# Patient Record
Sex: Female | Born: 1955 | Race: White | Hispanic: No | Marital: Married | Smoking: Never smoker
Health system: Southern US, Community
[De-identification: ages and names within clinical notes are randomized; demographics above are authoritative.]

## PROBLEM LIST (undated history)

## (undated) DIAGNOSIS — R011 Cardiac murmur, unspecified: Secondary | ICD-10-CM

## (undated) DIAGNOSIS — F419 Anxiety disorder, unspecified: Secondary | ICD-10-CM

## (undated) DIAGNOSIS — K219 Gastro-esophageal reflux disease without esophagitis: Secondary | ICD-10-CM

## (undated) DIAGNOSIS — E079 Disorder of thyroid, unspecified: Secondary | ICD-10-CM

## (undated) DIAGNOSIS — I10 Essential (primary) hypertension: Secondary | ICD-10-CM

## (undated) DIAGNOSIS — M1990 Unspecified osteoarthritis, unspecified site: Secondary | ICD-10-CM

## (undated) DIAGNOSIS — E785 Hyperlipidemia, unspecified: Secondary | ICD-10-CM

## (undated) DIAGNOSIS — E119 Type 2 diabetes mellitus without complications: Secondary | ICD-10-CM

## (undated) HISTORY — DX: Hyperlipidemia, unspecified: E78.5

## (undated) HISTORY — DX: Gastro-esophageal reflux disease without esophagitis: K21.9

## (undated) HISTORY — PX: REPLACEMENT TOTAL KNEE: SUR1224

## (undated) HISTORY — PX: APPENDECTOMY: SHX54

## (undated) HISTORY — DX: Cardiac murmur, unspecified: R01.1

## (undated) HISTORY — DX: Anxiety disorder, unspecified: F41.9

---

## 1998-09-09 ENCOUNTER — Other Ambulatory Visit: Admission: RE | Admit: 1998-09-09 | Discharge: 1998-09-09 | Payer: Self-pay

## 2007-01-22 HISTORY — PX: COLONOSCOPY: SHX5424

## 2007-03-31 ENCOUNTER — Inpatient Hospital Stay (HOSPITAL_COMMUNITY): Admission: RE | Admit: 2007-03-31 | Discharge: 2007-04-02 | Payer: Self-pay | Admitting: Orthopedic Surgery

## 2017-10-17 ENCOUNTER — Emergency Department (HOSPITAL_COMMUNITY): Payer: BLUE CROSS/BLUE SHIELD

## 2017-10-17 ENCOUNTER — Emergency Department (HOSPITAL_COMMUNITY)
Admission: EM | Admit: 2017-10-17 | Discharge: 2017-10-17 | Disposition: A | Discharge status: Home / Self Care | Payer: BLUE CROSS/BLUE SHIELD | Attending: Emergency Medicine | Admitting: Emergency Medicine

## 2017-10-17 ENCOUNTER — Encounter (HOSPITAL_COMMUNITY): Payer: Self-pay

## 2017-10-17 DIAGNOSIS — R1013 Epigastric pain: Secondary | ICD-10-CM | POA: Diagnosis present

## 2017-10-17 DIAGNOSIS — Z96659 Presence of unspecified artificial knee joint: Secondary | ICD-10-CM | POA: Diagnosis not present

## 2017-10-17 DIAGNOSIS — Z7984 Long term (current) use of oral hypoglycemic drugs: Secondary | ICD-10-CM | POA: Diagnosis not present

## 2017-10-17 DIAGNOSIS — I10 Essential (primary) hypertension: Secondary | ICD-10-CM | POA: Diagnosis not present

## 2017-10-17 DIAGNOSIS — E119 Type 2 diabetes mellitus without complications: Secondary | ICD-10-CM | POA: Insufficient documentation

## 2017-10-17 DIAGNOSIS — D649 Anemia, unspecified: Secondary | ICD-10-CM

## 2017-10-17 DIAGNOSIS — Z7982 Long term (current) use of aspirin: Secondary | ICD-10-CM | POA: Insufficient documentation

## 2017-10-17 DIAGNOSIS — K922 Gastrointestinal hemorrhage, unspecified: Secondary | ICD-10-CM | POA: Insufficient documentation

## 2017-10-17 DIAGNOSIS — E079 Disorder of thyroid, unspecified: Secondary | ICD-10-CM | POA: Diagnosis not present

## 2017-10-17 DIAGNOSIS — Z79899 Other long term (current) drug therapy: Secondary | ICD-10-CM | POA: Diagnosis not present

## 2017-10-17 DIAGNOSIS — K921 Melena: Secondary | ICD-10-CM | POA: Diagnosis not present

## 2017-10-17 DIAGNOSIS — R110 Nausea: Secondary | ICD-10-CM | POA: Diagnosis not present

## 2017-10-17 HISTORY — DX: Unspecified osteoarthritis, unspecified site: M19.90

## 2017-10-17 HISTORY — DX: Disorder of thyroid, unspecified: E07.9

## 2017-10-17 HISTORY — DX: Essential (primary) hypertension: I10

## 2017-10-17 HISTORY — DX: Type 2 diabetes mellitus without complications: E11.9

## 2017-10-17 LAB — COMPREHENSIVE METABOLIC PANEL
ALT: 15 U/L (ref 14–54)
AST: 17 U/L (ref 15–41)
Albumin: 3.5 g/dL (ref 3.5–5.0)
Alkaline Phosphatase: 77 U/L (ref 38–126)
Anion gap: 9 (ref 5–15)
BUN: 15 mg/dL (ref 6–20)
CO2: 23 mmol/L (ref 22–32)
Calcium: 8.4 mg/dL — ABNORMAL LOW (ref 8.9–10.3)
Chloride: 107 mmol/L (ref 101–111)
Creatinine, Ser: 0.67 mg/dL (ref 0.44–1.00)
GFR calc Af Amer: >60 mL/min (ref >60)
GFR calc non Af Amer: >60 mL/min (ref >60)
Glucose, Bld: 157 mg/dL — ABNORMAL HIGH (ref 65–99)
Potassium: 3.4 mmol/L — ABNORMAL LOW (ref 3.5–5.1)
Sodium: 139 mmol/L (ref 135–145)
Total Bilirubin: 0.5 mg/dL (ref 0.3–1.2)
Total Protein: 6.4 g/dL — ABNORMAL LOW (ref 6.5–8.1)

## 2017-10-17 LAB — TYPE AND SCREEN
ABO/RH(D): O NEG
Antibody Screen: NEGATIVE

## 2017-10-17 LAB — ABO/RH: ABO/RH(D): O NEG

## 2017-10-17 LAB — CBC
HCT: 23.9 % — ABNORMAL LOW (ref 36.0–46.0)
Hemoglobin: 8 g/dL — ABNORMAL LOW (ref 12.0–15.0)
MCH: 30.8 pg (ref 26.0–34.0)
MCHC: 33.5 g/dL (ref 30.0–36.0)
MCV: 91.9 fL (ref 78.0–100.0)
Platelets: 233 10*3/uL (ref 150–400)
RBC: 2.6 MIL/uL — ABNORMAL LOW (ref 3.87–5.11)
RDW: 15.5 % (ref 11.5–15.5)
WBC: 12.2 10*3/uL — ABNORMAL HIGH (ref 4.0–10.5)

## 2017-10-17 LAB — POC OCCULT BLOOD, ED: Fecal Occult Bld: POSITIVE — AB

## 2017-10-17 MED ORDER — MORPHINE SULFATE (PF) 4 MG/ML IV SOLN
4.0000 mg | Freq: Once | INTRAVENOUS | Status: AC
  Administered 2017-10-17: 4 mg via INTRAVENOUS
  Filled 2017-10-17: qty 1

## 2017-10-17 MED ORDER — ONDANSETRON HCL 4 MG/2ML IJ SOLN
4.0000 mg | Freq: Once | INTRAMUSCULAR | Status: AC
  Administered 2017-10-17: 4 mg via INTRAVENOUS
  Filled 2017-10-17: qty 2

## 2017-10-17 MED ORDER — IOPAMIDOL (ISOVUE-300) INJECTION 61%
INTRAVENOUS | Status: AC
  Administered 2017-10-17: 100 mL via INTRAVENOUS
  Filled 2017-10-17: qty 100

## 2017-10-17 MED ORDER — SODIUM CHLORIDE 0.9 % IV BOLUS (SEPSIS)
1000.0000 mL | Freq: Once | INTRAVENOUS | Status: AC
  Administered 2017-10-17: 1000 mL via INTRAVENOUS

## 2017-10-17 NOTE — ED Triage Notes (Signed)
Patient c/o upper and mid abdominal pain, nausea, black stools x 5 days. Patient states she saw her PCP yesterday and called the patient this AM and told the patient to come to the ED.

## 2017-10-17 NOTE — Discharge Instructions (Signed)
Blood work showed that you have a low red blood count. Your hemoglobin is 8.0 (normal is about 12). Your test for blood in the stool was positive. The CT scan of your abdomen did not show [place] a reason for why you are having lower abdominal bleeding. Please keep your appointment with the GI doctor tomorrow for follow-up and potential need for colonoscopy/endoscopy to determine where the bleeding is coming from. Please also schedule an appointment with your primary care doctor regarding today's ER visit.  Please take over-the-counter elemental iron in the meantime. You can take 325 mg twice a day for this. It may make you constipated, it is okay to take over-the-counter laxatives like MiraLAX while you're taking iron.  Your CT scan also showed evidence of hepatic steatosis. Your blood pressure was also elevated in the ER today. Please follow up with your primary doctor regarding this.   Please return to the emergency room if you develop increased blood in your stool, develop a fever with abdominal pain, have nausea/vomiting that does not improve, or have any new or worsening symptoms.

## 2017-10-17 NOTE — ED Provider Notes (Signed)
[facility] DEPT Provider Note   CSN: [id number] Arrival date & time: 10/17/17  1207     History   Chief Complaint Chief Complaint  Patient presents with  . Abdominal Pain  . GI Bleeding    HPI Mercedes Adams is a 61 y.o. female.  HPI   Mercedes Adams is a 61 year old female with a history of non-insulin-dependent diabetes, hypertension, osteoarthritis, hypothyroidism who presents to the emergency department for evaluation of left lower and epigastric abdominal pain with black tarry stools. Patient states that 5 days ago she developed gradually worsening "cramping" epigastric and left lower quadrant pain. At this time her pain is 4/10 in severity, she notes that it was 10/10 in severity a few days ago. She reports that her pain is occasionally improved with eating. She also has associated nausea, reports 1 episode of brown/red emesis which occurred 4 days ago. She has not had vomiting since this time. Patient also endorses approximately 3 episodes of black tarry stools per day for the past 4 days. She reports that she has never had this before. States that her last colonoscopy was in 2005, reports multiple polyps were removed at that time. She has a history of 2 cesarean deliveries and appendectomy surgery. She denies alcohol use, reports occasional NSAID use for her osteoarthritis. States that it has been several weeks since she last took ibuprofen. She denies fever, hematochezia, hematuria, dysuria, frequency, shortness of breath, chest pain. He states that she has felt fatigued for the past several days. No unexpected weight changes recently. Denies family history of colorectal cancer.   Past Medical History:  Diagnosis Date  . Arthritis   . Diabetes mellitus without complication ([place])   . Hypertension   . Thyroid disease     There are no active problems to display for this patient.   Past Surgical History:  Procedure Laterality Date  . APPENDECTOMY      . CESAREAN SECTION     x 2  . REPLACEMENT TOTAL KNEE      OB History    No data available       Home Medications    Prior to Admission medications   Medication Sig Start Date End Date Taking? Authorizing Provider  aspirin (BAYER ASPIRIN EC LOW DOSE) 81 MG EC tablet Take 81 mg by mouth daily. Swallow whole.   Yes [provider]  folic acid (FOLVITE) 1 MG tablet Take 1 mg by mouth daily. 09/06/17  Yes [provider]  ibuprofen (ADVIL,MOTRIN) 200 MG tablet Take 200 mg by mouth every 6 (six) hours as needed.   Yes [provider]  levothyroxine (SYNTHROID, LEVOTHROID) 100 MCG tablet Take 100 mcg by mouth daily before breakfast.   Yes [provider]  lisinopril-hydrochlorothiazide (PRINZIDE,ZESTORETIC) 10-12.5 MG tablet Take 1 tablet by mouth daily.   Yes [provider]  lovastatin (MEVACOR) 20 MG tablet Take 20 mg by mouth at bedtime. 2 tablets at bedtime   Yes [provider]  metFORMIN (GLUCOPHAGE) 500 MG tablet Take 2,000 mg by mouth daily with breakfast.   Yes [provider]  Methotrexate Sodium (METHOTREXATE, PF,) 50 MG/2ML injection Inject into the vein once a week.   Yes [provider]  Multiple Vitamin (MULTIVITAMIN) capsule Take 1 capsule by mouth daily.   Yes [provider]  omeprazole (PRILOSEC) 40 MG capsule Take 40 mg by mouth daily. 10/16/17  Yes [provider]  sertraline (ZOLOFT) 50 MG tablet Take 50 mg  by mouth daily.   Yes [provider]    Family History Family History  Problem Relation Age of Onset  . Hypertension Mother   . High Cholesterol Mother   . Hypertension Father   . High Cholesterol Father   . Heart failure Father     Social History Social History  Substance Use Topics  . Smoking status: Never Smoker  . Smokeless tobacco: Never Used  . Alcohol use No     Allergies   Patient has no known allergies.   Review of Systems Review of  Systems  Constitutional: Positive for fatigue. Negative for chills, fever and unexpected weight change.  HENT: Negative for nosebleeds.   Eyes: Negative for visual disturbance.  Respiratory: Negative for cough and shortness of breath.   Cardiovascular: Negative for chest pain, palpitations and leg swelling.  Gastrointestinal: Positive for abdominal pain, blood in stool, nausea and vomiting. Negative for constipation and diarrhea.  Genitourinary: Negative for difficulty urinating, dysuria, flank pain, frequency, hematuria, vaginal bleeding, vaginal discharge and vaginal pain.  Musculoskeletal: Negative for back pain and gait problem.  Skin: Negative for rash and wound.  Neurological: Negative for dizziness, weakness, light-headedness, numbness and headaches.  Psychiatric/Behavioral: Negative for agitation.     Physical Exam Updated Vital Signs BP 122/80   Pulse 74   Temp 98.3 F (36.8 C) (Oral)   Resp 16   Ht 5\' 4"  (1.626 m)   Wt 93 kg (205 lb)   SpO2 99%   BMI 35.19 kg/m   Physical Exam  Constitutional: She is oriented to person, place, and time. She appears well-developed and well-nourished. No distress.  Calm, answering questions appropriately.   HENT:  Head: Normocephalic and atraumatic.  Mouth/Throat: Oropharynx is clear and moist. No oropharyngeal exudate.  Mucous membranes moist.   Eyes: Pupils are equal, round, and reactive to light. Conjunctivae are normal. Right eye exhibits no discharge. Left eye exhibits no discharge.  Neck: Normal range of motion. Neck supple.  Cardiovascular: Normal rate, regular rhythm and intact distal pulses.  Exam reveals no friction rub.   No murmur heard. Pulmonary/Chest: Effort normal and breath sounds normal. No respiratory distress. She has no wheezes. She has no rales.  Abdominal: Soft. Bowel sounds are normal. She exhibits no distension.  Mildly tender to palpation in the LLQ and epigastrium with deep palpation. No guarding, rigidity  or rebound. Murphys sign negative.   Musculoskeletal: Normal range of motion.  Lymphadenopathy:    She has no cervical adenopathy.  Neurological: She is alert and oriented to person, place, and time. Coordination normal.  Skin: Skin is warm and dry. Capillary refill takes less than 2 seconds. She is not diaphoretic.  Psychiatric: She has a normal mood and affect. Her behavior is normal.  Nursing note and vitals reviewed.    ED Treatments / Results  Labs (all labs ordered are listed, but only abnormal results are displayed) Labs Reviewed  COMPREHENSIVE METABOLIC PANEL - Abnormal; Notable for the following:       Result Value   Potassium 3.4 (*)    Glucose, Bld 157 (*)    Calcium 8.4 (*)    Total Protein 6.4 (*)    All other components within normal limits  CBC - Abnormal; Notable for the following:    WBC 12.2 (*)    RBC 2.60 (*)    Hemoglobin 8.0 (*)    HCT 23.9 (*)    All other components within normal limits  POC OCCULT BLOOD, ED -  Abnormal; Notable for the following:    Fecal Occult Bld POSITIVE (*)    All other components within normal limits  TYPE AND SCREEN  ABO/RH    EKG  EKG Interpretation None       Radiology Ct Abdomen Pelvis W Contrast  Result Date: 10/17/2017 CLINICAL DATA:  Upper and mid abdominal pain with black stools x5 days. EXAM: CT ABDOMEN AND PELVIS WITH CONTRAST TECHNIQUE: Multidetector CT imaging of the abdomen and pelvis was performed using the standard protocol following bolus administration of intravenous contrast. CONTRAST:  100 cc ISOVUE-300 IOPAMIDOL (ISOVUE-300) INJECTION 61% COMPARISON:  11/12/2007 CT FINDINGS: Lower chest: Normal size heart. No pericardial effusion. Dependent atelectasis the lung bases. Hepatobiliary: Hepatic steatosis. No biliary dilatation. No gallstones or secondary signs acute cholecystitis. No choledocholithiasis. Pancreas: Normal Spleen: Normal Adrenals/Urinary Tract: Normal bilateral adrenal glands and kidneys. No  hydroureteronephrosis. Physiologic distention of the bladder without focal mural thickening or calculus. Stomach/Bowel: Nondistended stomach without wall thickening. Normal small bowel rotation and ligament of Treitz position. No small bowel inflammation or obstruction. Status post appendectomy by report. An average amount of stool is seen retained within the cecum and ascending colon. No mural thickening or inflammation is identified. A few scattered colonic diverticula are noted along the distal descending colon. Vascular/Lymphatic: Mild aortoiliac atherosclerosis. 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 No aneurysm or adenopathy. Reproductive: Uterus and bilateral adnexa are unremarkable. Other: Tiny fat containing umbilical hernia. No abdominopelvic ascites. Musculoskeletal: T8 through T12 degenerative disc disease as well as at L5-S1. No acute nor suspicious osseous abnormalities. IMPRESSION: 1. Hepatic steatosis. 2. Scattered minimal colonic diverticulosis without acute diverticulitis. No mural thickening, inflammation or bowel obstruction. 3. Mild thoracic and lower lumbar spondylosis. Electronically Signed   By: Ashley Royalty M.D.   On: 10/17/2017 18:21    Procedures Procedures (including critical care time)  Medications Ordered in ED Medications  sodium chloride 0.9 % bolus 1,000 mL (0 mLs Intravenous Stopped 10/17/17 2109)  morphine 4 MG/ML injection 4 mg (4 mg Intravenous Given 10/17/17 1813)  ondansetron (ZOFRAN) injection 4 mg (4 mg Intravenous Given 10/17/17 1813)  iopamidol (ISOVUE-300) 61 % injection (100 mLs Intravenous Contrast Given 10/17/17 1800)     Initial Impression / Assessment and Plan / ED Course  I have reviewed the triage vital signs and the nursing notes.  Pertinent labs & imaging results that were available during my care of the patient were reviewed by me and considered in my medical decision making (see chart for details).  Clinical Course as of  Oct 18 56  Thu Oct 17, 2017  1739 Concern for lower GI bleed and potential colitis given BUN normal, leukocytosis (WBC 12.2), anemia (Hgb 8.0). We do not have a baseline hemoglobin on record. Discussed patient with Dr. Eulis Foster who agrees with plan for CT abdomen/pelvis.   [ES]    Clinical Course User Index [ES] Glyn Ade, PA-C    Patient presents with abdominal pain and several days of dark tarry stools. She is anemic with Hgb 8.0, no baseline to compare this to. She has a mildly elevated WBC (12.2) and positive Hemoccult. Will not transfuse with hemoglobin of 8. CT scan ordered to evaluate for lower GI bleed and potential colitis.   CT scan reveals diverticulosis, no diverticulitis. No mucosal thickening to suggest colitis. No bowel obstruction. On reevaluation patient states that her abdominal pain has improved, she denies nausea. Is able to tolerate PO fluids at bedside. She has an  appointment with GI tomorrow morning. Have counseled patient to keep appointment with GI for further evaluation of lower GI bleeding and also to follow up with her primary doctor regarding today's ER visit. In the meantime, have counseled patient to take elemental iron. Have given patient strict return precautions including worsening blood per rectum, nausea/vomiting that does not improve, development of a fever with abdominal pain. Also discussed that patient has hepatic steatosis on CT abdomen, she has been counseled to follow up on this with her primary doctor. Patient agrees and voices understanding. Discussed this patient with Dr. Eulis Foster who agrees with plan and discharge.   Final Clinical Impressions(s) / ED Diagnoses   Final diagnoses:  Black stools  Anemia, unspecified type  Epigastric pain  Nausea    New Prescriptions Discharge Medication List as of 10/17/2017  8:47 PM       Glyn Ade, PA-C 10/18/17 0737    Daleen Bo, MD 10/22/17 1030

## 2017-10-18 ENCOUNTER — Ambulatory Visit (INDEPENDENT_AMBULATORY_CARE_PROVIDER_SITE_OTHER): Payer: Self-pay | Admitting: Physician Assistant

## 2017-10-18 ENCOUNTER — Encounter: Payer: Self-pay | Admitting: Gastroenterology

## 2017-10-18 ENCOUNTER — Encounter: Payer: Self-pay | Admitting: Physician Assistant

## 2017-10-18 VITALS — BP 122/60 | HR 68 | Ht 64.0 in | Wt 211.2 lb

## 2017-10-18 DIAGNOSIS — R011 Cardiac murmur, unspecified: Secondary | ICD-10-CM

## 2017-10-18 DIAGNOSIS — R1084 Generalized abdominal pain: Secondary | ICD-10-CM

## 2017-10-18 DIAGNOSIS — K921 Melena: Secondary | ICD-10-CM | POA: Diagnosis not present

## 2017-10-18 DIAGNOSIS — D508 Other iron deficiency anemias: Secondary | ICD-10-CM

## 2017-10-18 DIAGNOSIS — D649 Anemia, unspecified: Secondary | ICD-10-CM | POA: Diagnosis not present

## 2017-10-18 MED ORDER — NA SULFATE-K SULFATE-MG SULF 17.5-3.13-1.6 GM/177ML PO SOLN: 1.0000 | ORAL | 0 refills | Status: DC

## 2017-10-18 NOTE — Progress Notes (Signed)
Chief Complaint: Anemia, melena, abdominal pain  HPI:  Mercedes Adams is a 61 year old Caucasian female with past medical history as listed below, who was referred to me by Alonna Buckler* for a complaint of anemia, melena and abdominal pain .      Patient had labs completed 10/16/17 which showed a hemoglobin low at 8.6. MCV was normal at 91. White count was minimally increased at 12.1. Patient also had a normal BUN. According to physician's notes 10/16/17 patient had vomiting and hematemesis of "coffee ground" material with abdominal pain. Patient described a colonoscopy more than 10 years ago with Dr. Lyndel Safe with multiple polyps but never had a repeat.   Patient was seen in the ER yesterday, 10/17/17 with a complaint of left lower and epigastric abdominal pain and black tarry stools. Patient described worsening abdominal cramping 5 days prior and 2 episodes of vomiting a brown/red emesis. She described 3 episodes of black tarry stools per day for the past 4 days. At that time, labs showed a CBC with a hemoglobin of 8, white count continue elevated at 12.2, potassium 3.4 and otherwise normal. Patient was found to be fecal occult positive. CT of abd and pelvis with hepatic steatosis, scattered minimal colonic diverticula, mild thoracic and lower lumbar spondylosis and was otherwise normal. It was recommended she follow with [place] as scheduled because she was stable with no increase in BUN.    Today, the patient describes that she has had "stomach issues" off and on for a few months now. She describes cramping which results in diarrhea. This tends to come and go. She also describes an epigastric pain which comes and goes. The patient notes that most recently last Sunday she started with black stool and would have at least 2-3 stools a day which were very "black and sticky-looking". Patient also described heartburn and reflux over that period of time. She started Omeprazole 40 mg 2 days ago and has had  relief of a lot of her abdominal pain as well as reflux. Patient tells me she has only seen a small amount of black stool over the past day since being seen in the ER yesterday. She only had one episode of vomiting which occurred twice in a row 6 days ago. Patient tells me this was "brownish/reddish". Patient describes some nausea which is somewhat better now. Associated symptoms include a shortness of breath/dyspnea on exertion and some dizziness when changing position.   Patient also describes some chest pain today, apparently has been occurring off and on for years, no change recently.  Past Medical History:  Diagnosis Date  . Arthritis   . Diabetes mellitus without complication ([place])   . Hypertension   . Thyroid disease     Past Surgical History:  Procedure Laterality Date  . APPENDECTOMY    . CESAREAN SECTION     x 2  . REPLACEMENT TOTAL KNEE     left knee    Current Outpatient Prescriptions  Medication Sig Dispense Refill  . aspirin (BAYER ASPIRIN EC LOW DOSE) 81 MG EC tablet Take 81 mg by mouth daily. Swallow whole.    . folic acid (FOLVITE) 1 MG tablet Take 1 mg by mouth daily.  0  . ibuprofen (ADVIL,MOTRIN) 200 MG tablet Take 200 mg by mouth every 6 (six) hours as needed.    Marland Kitchen levothyroxine (SYNTHROID, LEVOTHROID) 100 MCG tablet Take 100 mcg by mouth daily before breakfast.    . lisinopril-hydrochlorothiazide (PRINZIDE,ZESTORETIC) 10-12.5 MG tablet Take 1 tablet  by mouth daily.    Marland Kitchen lovastatin (MEVACOR) 20 MG tablet Take 20 mg by mouth at bedtime. 2 tablets at bedtime    . metFORMIN (GLUCOPHAGE) 500 MG tablet Take 2,000 mg by mouth daily with breakfast.    . Methotrexate Sodium (METHOTREXATE, PF,) 50 MG/2ML injection Inject into the vein once a week.    . Multiple Vitamin (MULTIVITAMIN) capsule Take 1 capsule by mouth daily.    Marland Kitchen omeprazole (PRILOSEC) 40 MG capsule Take 40 mg by mouth daily.  0  . sertraline (ZOLOFT) 50 MG tablet Take 50 mg by mouth daily.     No current  facility-administered medications for this visit.     Allergies as of 10/18/2017  . (No Known Allergies)    Family History  Problem Relation Age of Onset  . Hypertension Mother   . High Cholesterol Mother   . Hypertension Father   . High Cholesterol Father   . Heart failure Father     Social History   Social History  . Marital status: Married    Spouse name: N/A  . Number of children: 2  . Years of education: N/A   Occupational History  . Not on file.   Social History Main Topics  . Smoking status: Never Smoker  . Smokeless tobacco: Never Used  . Alcohol use No  . Drug use: No  . Sexual activity: Not on file   Other Topics Concern  . Not on file   Social History Narrative  . No narrative on file    Review of Systems:    Constitutional: No weight loss, fever or chills Skin: No rash  Cardiovascular: Positive for occasional exertional c/p Respiratory: Positive for DOE Gastrointestinal: See HPI and otherwise negative Genitourinary: No dysuria  Neurological: No headache Musculoskeletal: No new muscle or joint pain Hematologic: No bruising Psychiatric: No history of depression or anxiety   Physical Exam:  Vital signs: BP 122/60   Pulse 68   Ht 5\' 4"  (1.626 m)   Wt 211 lb 4 oz (95.8 kg)   BMI 36.26 kg/m   Constitutional:   Pleasant Caucasian female appears to be in NAD, Well developed, Well nourished, alert and cooperative Head:  Normocephalic and atraumatic. Eyes:   PEERL, EOMI. No icterus. Conjunctiva pink. Ears:  Normal auditory acuity. Neck:  Supple Throat: Oral cavity and pharynx without inflammation, swelling or lesion.  Respiratory: Respirations even and unlabored. Lungs clear to auscultation bilaterally.   No wheezes, crackles, or rhonchi.  Cardiovascular: Normal S1, S2. + murmur Regular rate and rhythm. No peripheral edema, cyanosis or pallor.  Gastrointestinal:  Soft, nondistended, mild generalized ttp, No rebound or guarding. Normal bowel  sounds. No appreciable masses or hepatomegaly. Rectal:  Not performed.  Msk:  Symmetrical without gross deformities. Without edema, no deformity or joint abnormality.  Neurologic:  Alert and  oriented x4;  grossly normal neurologically.  Skin:   Dry and intact without significant lesions or rashes. Psychiatric: Demonstrates good judgement and reason without abnormal affect or behaviors.  MOST RECENT LABS AND IMAGING: CBC    Component Value Date/Time   WBC 12.2 (H) 10/17/2017 1329   RBC 2.60 (L) 10/17/2017 1329   HGB 8.0 (L) 10/17/2017 1329   HCT 23.9 (L) 10/17/2017 1329   PLT 233 10/17/2017 1329   MCV 91.9 10/17/2017 1329   MCH 30.8 10/17/2017 1329   MCHC 33.5 10/17/2017 1329   RDW 15.5 10/17/2017 1329    CMP     Component Value Date/Time  NA 139 10/17/2017 1329   K 3.4 (L) 10/17/2017 1329   CL 107 10/17/2017 1329   CO2 23 10/17/2017 1329   GLUCOSE 157 (H) 10/17/2017 1329   BUN 15 10/17/2017 1329   CREATININE 0.67 10/17/2017 1329   CALCIUM 8.4 (L) 10/17/2017 1329   PROT 6.4 (L) 10/17/2017 1329   ALBUMIN 3.5 10/17/2017 1329   AST 17 10/17/2017 1329   ALT 15 10/17/2017 1329   ALKPHOS 77 10/17/2017 1329   BILITOT 0.5 10/17/2017 1329   GFRNONAA >60 10/17/2017 1329   GFRAA >60 10/17/2017 1329   Ct Abdomen Pelvis W Contrast  Result Date: 10/17/2017 CLINICAL DATA:  Upper and mid abdominal pain with black stools x5 days. EXAM: CT ABDOMEN AND PELVIS WITH CONTRAST TECHNIQUE: Multidetector CT imaging of the abdomen and pelvis was performed using the standard protocol following bolus administration of intravenous contrast. CONTRAST:  100 cc ISOVUE-300 IOPAMIDOL (ISOVUE-300) INJECTION 61% COMPARISON:  11/12/2007 CT FINDINGS: Lower chest: Normal size heart. No pericardial effusion. Dependent atelectasis the lung bases. Hepatobiliary: Hepatic steatosis. No biliary dilatation. No gallstones or secondary signs acute cholecystitis. No choledocholithiasis. Pancreas: Normal Spleen: Normal  Adrenals/Urinary Tract: Normal bilateral adrenal glands and kidneys. No hydroureteronephrosis. Physiologic distention of the bladder without focal mural thickening or calculus. Stomach/Bowel: Nondistended stomach without wall thickening. Normal small bowel rotation and ligament of Treitz position. No small bowel inflammation or obstruction. Status post appendectomy by report. An average amount of stool is seen retained within the cecum and ascending colon. No mural thickening or inflammation is identified. A few scattered colonic diverticula are noted along the distal descending colon. Vascular/Lymphatic: Mild aortoiliac atherosclerosis. 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 No aneurysm or adenopathy. Reproductive: Uterus and bilateral adnexa are unremarkable. Other: Tiny fat containing umbilical hernia. No abdominopelvic ascites. Musculoskeletal: T8 through T12 degenerative disc disease as well as at L5-S1. No acute nor suspicious osseous abnormalities. IMPRESSION: 1. Hepatic steatosis. 2. Scattered minimal colonic diverticulosis without acute diverticulitis. No mural thickening, inflammation or bowel obstruction. 3. Mild thoracic and lower lumbar spondylosis. Electronically Signed   By: Ashley Royalty M.D.   On: 10/17/2017 18:21     Assessment: 1. Anemia: Likely with below; consider upper vs lower GI bleed 2. Melena: for the past 4 days, 2-3 times per day per patient, no syncope but DOE, seen in ER yesterday, stable, vitals stable today, symptoms some better after starting PPI yesterday; Concern for upper GI bleed 3. Abdominal pain: epigastric and lower abdomen; consider relation to PUD vs IBS vs other 4. Heart murmur: heard at time of exam today, new for the patient, suspect this is related to anemia-if this does not resolve with resolution of anemia, would recommend cardiac work up  Plan: 1. Discussed with the patient that I believe she needs an urgent EGD due to history of melena  and epigastric abdominal pain. Discussed with Dr. Fuller Plan. He does not have openings until November. Scheduled patient for an EGD with Dr. Havery Moros, in [facility] on Tuesday, 10/22/17. Did discuss risks, benefits, limitations and alternatives the patient agrees to proceed. Will communicate with Dr. Havery Moros, he will let me know if this is not a good day and we can change this prior to Tuesday or send patient to hospital for workup there. 2. Patient to continue Omeprazole 40 mg daily, 30-60 minutes before eating 3. Patient will eventually need a colonoscopy as well, for screening purposes if etiology of bleeding is found via EGD or for further evaluation of  IDA if no etiology found. 4. Patient to start oral iron once daily 5. Of note patient may need cardiology evaluation in the future if continues with heart murmur after resolution of anemia or experiences worsening chest pain with exertion 6. Patient advised to proceed to the ER before EGD if increased/worsened SOB, syncope or near syncope or further melena, she would prefer not to go into the hospital if at all possible 7. Patient to return to clinic per recommendations from Dr. Havery Moros after time of procedure.  Ellouise Newer, PA-C [facility] 10/18/2017, 9:39 AM  Cc: Alonna Buckler*

## 2017-10-18 NOTE — Patient Instructions (Signed)
Continue Omeprazole 40 mg daily.   Continue iron daily.   Your physician has requested that you go to the basement for the following lab work in 2 weeks. We will call you and remind you.   You have been scheduled for an endoscopy and colonoscopy. Please follow the written instructions given to you at your visit today. Please pick up your prep supplies at the pharmacy within the next 1-3 days. If you use inhalers (even only as needed), please bring them with you on the day of your procedure. Your physician has requested that you go to www.startemmi.com and enter the access code given to you at your visit today. This web site gives a general overview about your procedure. However, you should still follow specific instructions given to you by our office regarding your preparation for the procedure.

## 2017-10-20 NOTE — Progress Notes (Signed)
Agree with assessment and plan as outlined. Patient scheduled for EGD with me on 10/23. She should take omeprazole 40mg  twice daily until that time and avoid all NSAIDs.

## 2017-10-21 ENCOUNTER — Telehealth: Payer: Self-pay | Admitting: Gastroenterology

## 2017-10-21 NOTE — Progress Notes (Signed)
Can you please tell patient to take her Omeprazole 40mg  TWICE DAILY until time of EGD. Thanks-JLL

## 2017-10-21 NOTE — Telephone Encounter (Signed)
Called patient and explained the instructions for her to follow for her EGD scheduled for tomorrow at 10:00. I explained that she can eat until midnight tonight and then she could only do clear liquids until 7:00am the morning of her procedure. I instructed her to take morning meds except for her metformin. She understood and had no further questions.

## 2017-10-22 ENCOUNTER — Ambulatory Visit (AMBULATORY_SURGERY_CENTER): Payer: BLUE CROSS/BLUE SHIELD | Admitting: Gastroenterology

## 2017-10-22 VITALS — BP 135/81 | HR 82 | Temp 98.0°F | Resp 25 | Ht 64.0 in | Wt 211.0 lb

## 2017-10-22 DIAGNOSIS — K921 Melena: Secondary | ICD-10-CM | POA: Diagnosis not present

## 2017-10-22 DIAGNOSIS — K2980 Duodenitis without bleeding: Secondary | ICD-10-CM | POA: Diagnosis not present

## 2017-10-22 DIAGNOSIS — R1084 Generalized abdominal pain: Secondary | ICD-10-CM

## 2017-10-22 DIAGNOSIS — D649 Anemia, unspecified: Secondary | ICD-10-CM

## 2017-10-22 DIAGNOSIS — K269 Duodenal ulcer, unspecified as acute or chronic, without hemorrhage or perforation: Secondary | ICD-10-CM | POA: Diagnosis not present

## 2017-10-22 MED ORDER — SODIUM CHLORIDE 0.9 % IV SOLN: 500.0000 mL | INTRAVENOUS | Status: DC

## 2017-10-22 MED ORDER — OMEPRAZOLE 40 MG PO CPDR: 40.0000 mg | DELAYED_RELEASE_CAPSULE | Freq: Every day | ORAL | 1 refills | Status: DC

## 2017-10-22 NOTE — Progress Notes (Signed)
Pt's states no medical or surgical changes since previsit or office visit. 

## 2017-10-22 NOTE — Op Note (Signed)
[facility] Patient Name: Mercedes Adams Procedure Date: 10/22/2017 8:39 AM MRN: [id number] Endoscopist: Remo Lipps P. Armbruster MD, MD Age: 61 Referring MD:  Date of Birth: [birth year] Gender: Female Account #: [id number] Procedure:                Upper GI endoscopy Indications:              Epigastric abdominal pain, anemia, history of melena Medicines:                Monitored Anesthesia Care Procedure:                Pre-Anesthesia Assessment:                           - Prior to the procedure, a History and Physical                            was performed, and patient medications and                            allergies were reviewed. The patient's tolerance of                            previous anesthesia was also reviewed. The risks                            and benefits of the procedure and the sedation                            options and risks were discussed with the patient.                            All questions were answered, and informed consent                            was obtained. Prior Anticoagulants: The patient has                            taken no previous anticoagulant or antiplatelet                            agents. ASA Grade Assessment: II - A patient with                            mild systemic disease. After reviewing the risks                            and benefits, the patient was deemed in                            satisfactory condition to undergo the procedure.                           After obtaining informed consent, the endoscope was  passed under direct vision. Throughout the                            procedure, the patient's blood pressure, pulse, and                            oxygen saturations were monitored continuously. The                            Endoscope was introduced through the mouth, and                            advanced to the second part of duodenum. The upper     GI endoscopy was accomplished without difficulty.                            The patient tolerated the procedure well. Scope In: Scope Out: Findings:                 Esophagogastric landmarks were identified: the                            Z-line was found at 35 cm, the gastroesophageal                            junction was found at 35 cm and the upper extent of                            the gastric folds was found at 37 cm from the                            incisors.                           A 2 cm hiatal hernia was present.                           The exam of the esophagus was otherwise normal.                           The entire examined stomach was normal. Biopsies                            were taken with a cold forceps from the antrum,                            body, and incisura for Helicobacter pylori testing.                           One non-bleeding clean based cratered duodenal                            ulcer with no stigmata of bleeding was found in the  duodenal bulb. The lesion was roughly 4 mm in                            largest dimension. There was edema at the site                            causing narrowing of the lumen at the site of the                            ulcer, but the endoscope was able to traverse it.                            Mild duodenitis was noted in the bulb. Biopsies                            were taken from the stenosed / inflamed mucosa near                            the ulceration with a cold forceps for histology.                           The exam of the duodenum was otherwise normal. Complications:            No immediate complications. Estimated blood loss:                            Minimal. Estimated Blood Loss:     Estimated blood loss was minimal. Impression:               - Esophagogastric landmarks identified.                           - 2 cm hiatal hernia.                           - Normal  esophagus                           - Normal stomach. Biopsied for H pylori testing                            given duodenal ulcer noted on this exam.                           - One non-bleeding clean based duodenal ulcer with                            no stigmata of bleeding, associated with edema /                            narrowed lumen and duodenitis. The area of edema /                            narrowed lumen near the ulcer  was biopsied. Recommendation:           - Patient has a contact number available for                            emergencies. The signs and symptoms of potential                            delayed complications were discussed with the                            patient. Return to normal activities tomorrow.                            Written discharge instructions were provided to the                            patient.                           - Resume previous diet.                           - Continue present medications.                           - Continue omeprazole 40mg  twice daily for the next                            month, then once daily                           - Hold baby aspirin for 2 weeks                           - Await pathology results.                           - No ibuprofen, naproxen, or other non-steroidal                            anti-inflammatory drugs                           - Schedule screening colonoscopy when able to in                            the upcoming months Steven P. Armbruster MD, MD 10/22/2017 9:01:04 AM This report has been signed electronically.

## 2017-10-22 NOTE — Progress Notes (Signed)
No problems noted in the recovery room. maw 

## 2017-10-22 NOTE — Progress Notes (Signed)
Report to PACU, RN, vss, BBS= Clear.  

## 2017-10-22 NOTE — Progress Notes (Signed)
Called to room to assist during endoscopic procedure.  Patient ID and intended procedure confirmed with present staff. Received instructions for my participation in the procedure from the performing physician.  

## 2017-10-22 NOTE — Patient Instructions (Signed)
YOU HAD AN ENDOSCOPIC PROCEDURE TODAY AT [facility]:   Refer to the procedure report that was given to you for any specific questions about what was found during the examination.  If the procedure report does not answer your questions, please call your gastroenterologist to clarify.  If you requested that your care partner not be given the details of your procedure findings, then the procedure report has been included in a sealed envelope for you to review at your convenience later.  YOU SHOULD EXPECT: Some feelings of bloating in the abdomen. Passage of more gas than usual.  Walking can help get rid of the air that was put into your GI tract during the procedure and reduce the bloating. If you had a lower endoscopy (such as a colonoscopy or flexible sigmoidoscopy) you may notice spotting of blood in your stool or on the toilet paper. If you underwent a bowel prep for your procedure, you may not have a normal bowel movement for a few days.  Please Note:  You might notice some irritation and congestion in your nose or some drainage.  This is from the oxygen used during your procedure.  There is no need for concern and it should clear up in a day or so.  SYMPTOMS TO REPORT IMMEDIATELY:    Following upper endoscopy (EGD)  Vomiting of blood or coffee ground material  New chest pain or pain under the shoulder blades  Painful or persistently difficult swallowing  New shortness of breath  Fever of 100F or higher  Black, tarry-looking stools  For urgent or emergent issues, a gastroenterologist can be reached at any hour by calling [phone].   DIET:  We do recommend a small meal at first, but then you may proceed to your regular diet.  Drink plenty of fluids but you should avoid alcoholic beverages for 24 hours.  ACTIVITY:  You should plan to take it easy for the rest of today and you should NOT DRIVE or use heavy machinery until tomorrow (because of the sedation medicines used  during the test).    FOLLOW UP: Our staff will call the number listed on your records the next business day following your procedure to check on you and address any questions or concerns that you may have regarding the information given to you following your procedure. If we do not reach you, we will leave a message.  However, if you are feeling well and you are not experiencing any problems, there is no need to return our call.  We will assume that you have returned to your regular daily activities without incident.  If any biopsies were taken you will be contacted by phone or by letter within the next 1-3 weeks.  Please call us at [phone] if you have not heard about the biopsies in 3 weeks.    SIGNATURES/CONFIDENTIALITY: You and/or your care partner have signed paperwork which will be entered into your electronic medical record.  These signatures attest to the fact that that the information above on your After Visit Summary has been reviewed and is understood.  Full responsibility of the confidentiality of this discharge information lies with you and/or your care-partner.    Handout was given to your care partner on a hiatal hernia. Please hold aspirin 81 mg for 2 weeks, then restart. NO ASPIRIN, ASPIRIN CONTAINING PRODUCTS (BC OR GOODY POWDERS) OR NSAIDS (IBUPROFEN, ADVIL, ALEVE, AND MOTRIN); TYLENOL IS OK TO TAKE. Please take OMEPRAZOLE 40 mg 2  x daily for 1 month, then decrease to every am.  Rx was sent to [facility]. Your blood sugar was 133 in the recovery room. You may resume your other current medications today. Await biopsy results. Please call if any questions or concerns.

## 2017-10-23 ENCOUNTER — Telehealth: Payer: Self-pay

## 2017-10-23 NOTE — Telephone Encounter (Signed)
  Follow up Call-  Call back number 10/22/2017  Post procedure Call Back phone  # [phone] cell.  Permission to leave phone message Yes  Some recent data might be hidden     Patient questions:  Do you have a fever, pain , or abdominal swelling? No. Pain Score  0 *  Have you tolerated food without any problems? Yes.    Have you been able to return to your normal activities? Yes.    Do you have any questions about your discharge instructions: Diet   No. Medications  No. Follow up visit  No.  Do you have questions or concerns about your Care? No.  Actions: * If pain score is 4 or above: No action needed, pain <4.  No problems noted per pt. maw

## 2017-10-24 ENCOUNTER — Encounter: Payer: Self-pay | Admitting: Gastroenterology

## 2017-11-05 ENCOUNTER — Other Ambulatory Visit (INDEPENDENT_AMBULATORY_CARE_PROVIDER_SITE_OTHER): Payer: BLUE CROSS/BLUE SHIELD

## 2017-11-05 ENCOUNTER — Other Ambulatory Visit: Payer: Self-pay | Admitting: Emergency Medicine

## 2017-11-05 DIAGNOSIS — K921 Melena: Secondary | ICD-10-CM

## 2017-11-05 LAB — CBC WITH DIFFERENTIAL/PLATELET
Basophils Absolute: 0 10*3/uL (ref 0.0–0.1)
Basophils Relative: 0.4 % (ref 0.0–3.0)
Eosinophils Absolute: 0.3 10*3/uL (ref 0.0–0.7)
Eosinophils Relative: 3.3 % (ref 0.0–5.0)
HCT: 30.8 % — ABNORMAL LOW (ref 36.0–46.0)
Hemoglobin: 10 g/dL — ABNORMAL LOW (ref 12.0–15.0)
Lymphocytes Relative: 22.9 % (ref 12.0–46.0)
Lymphs Abs: 1.8 10*3/uL (ref 0.7–4.0)
MCHC: 32.5 g/dL (ref 30.0–36.0)
MCV: 90.7 fl (ref 78.0–100.0)
Monocytes Absolute: 0.7 10*3/uL (ref 0.1–1.0)
Monocytes Relative: 8.4 % (ref 3.0–12.0)
Neutro Abs: 5.1 10*3/uL (ref 1.4–7.7)
Neutrophils Relative %: 65 % (ref 43.0–77.0)
Platelets: 261 10*3/uL (ref 150.0–400.0)
RBC: 3.4 Mil/uL — ABNORMAL LOW (ref 3.87–5.11)
RDW: 17.1 % — ABNORMAL HIGH (ref 11.5–15.5)
WBC: 7.9 10*3/uL (ref 4.0–10.5)

## 2017-11-05 LAB — IBC PANEL
Iron: 27 ug/dL — ABNORMAL LOW (ref 42–145)
Saturation Ratios: 6 % — ABNORMAL LOW (ref 20.0–50.0)
Transferrin: 323 mg/dL (ref 212.0–360.0)

## 2017-11-05 LAB — FERRITIN: Ferritin: 11 ng/mL (ref 10.0–291.0)

## 2017-11-13 ENCOUNTER — Ambulatory Visit (AMBULATORY_SURGERY_CENTER): Payer: BLUE CROSS/BLUE SHIELD | Admitting: Gastroenterology

## 2017-11-13 ENCOUNTER — Encounter: Payer: Self-pay | Admitting: Gastroenterology

## 2017-11-13 ENCOUNTER — Encounter: Payer: BLUE CROSS/BLUE SHIELD | Admitting: Gastroenterology

## 2017-11-13 ENCOUNTER — Other Ambulatory Visit: Payer: Self-pay

## 2017-11-13 VITALS — BP 119/69 | HR 77 | Temp 98.0°F | Resp 9 | Ht 64.0 in | Wt 211.0 lb

## 2017-11-13 DIAGNOSIS — D509 Iron deficiency anemia, unspecified: Secondary | ICD-10-CM

## 2017-11-13 DIAGNOSIS — D124 Benign neoplasm of descending colon: Secondary | ICD-10-CM

## 2017-11-13 DIAGNOSIS — D125 Benign neoplasm of sigmoid colon: Secondary | ICD-10-CM

## 2017-11-13 MED ORDER — SODIUM CHLORIDE 0.9 % IV SOLN: 500.0000 mL | INTRAVENOUS | Status: DC

## 2017-11-13 NOTE — Patient Instructions (Addendum)
**  Handouts given on polyps, hemorrhoids, diverticulosis, and a high fiber diet**   YOU HAD AN ENDOSCOPIC PROCEDURE TODAY: Refer to the procedure report and other information in the discharge instructions given to you for any specific questions about what was found during the examination. If this information does not answer your questions, please call [facility] office at [phone] to clarify.   YOU SHOULD EXPECT: Some feelings of bloating in the abdomen. Passage of more gas than usual. Walking can help get rid of the air that was put into your GI tract during the procedure and reduce the bloating. If you had a lower endoscopy (such as a colonoscopy or flexible sigmoidoscopy) you may notice spotting of blood in your stool or on the toilet paper. Some abdominal soreness may be present for a day or two, also.  DIET: Your first meal following the procedure should be a light meal and then it is ok to progress to your normal diet. A half-sandwich or bowl of soup is an example of a good first meal. Heavy or fried foods are harder to digest and may make you feel nauseous or bloated. Drink plenty of fluids but you should avoid alcoholic beverages for 24 hours. If you had a esophageal dilation, please see attached instructions for diet.    ACTIVITY: Your care partner should take you home directly after the procedure. You should plan to take it easy, moving slowly for the rest of the day. You can resume normal activity the day after the procedure however YOU SHOULD NOT DRIVE, use power tools, machinery or perform tasks that involve climbing or major physical exertion for 24 hours (because of the sedation medicines used during the test).   SYMPTOMS TO REPORT IMMEDIATELY: A gastroenterologist can be reached at any hour. Please call [phone]  for any of the following symptoms:  Following lower endoscopy (colonoscopy, flexible sigmoidoscopy) Excessive amounts of blood in the stool  Significant tenderness,  worsening of abdominal pains  Swelling of the abdomen that is new, acute  Fever greater than 100 degrees.  FOLLOW UP:  If any biopsies were taken you will be contacted by phone or by letter within the next 1-3 weeks. Call [phone]  if you have not heard about the biopsies in 3 weeks.  Please also call with any specific questions about appointments or follow up tests.

## 2017-11-13 NOTE — Op Note (Signed)
[facility] Patient Name: Mercedes Adams Procedure Date: 11/13/2017 2:34 PM MRN: [id number] Endoscopist: Ladene Artist , MD Age: 61 Referring MD:  Date of Birth: [birth year] Gender: Female Account #: [id number] Procedure:                Colonoscopy Indications:              Iron deficiency anemia Medicines:                Monitored Anesthesia Care Procedure:                Pre-Anesthesia Assessment:                           - Prior to the procedure, a History and Physical                            was performed, and patient medications and                            allergies were reviewed. The patient's tolerance of                            previous anesthesia was also reviewed. The risks                            and benefits of the procedure and the sedation                            options and risks were discussed with the patient.                            All questions were answered, and informed consent                            was obtained. Prior Anticoagulants: The patient has                            taken no previous anticoagulant or antiplatelet                            agents. ASA Grade Assessment: II - A patient with                            mild systemic disease. After reviewing the risks                            and benefits, the patient was deemed in                            satisfactory condition to undergo the procedure.                           After obtaining informed consent, the colonoscope  was passed under direct vision. Throughout the                            procedure, the patient's blood pressure, pulse, and                            oxygen saturations were monitored continuously. The                            Model PCF-H190DL [id number]) scope was introduced                            through the anus and advanced to the the cecum,                            identified by appendiceal orifice and  ileocecal                            valve. The ileocecal valve, appendiceal orifice,                            and rectum were photographed. The quality of the                            bowel preparation was good. The colonoscopy was                            performed without difficulty. The patient tolerated                            the procedure well. Scope In: 2:47:36 PM Scope Out: 3:01:12 PM Scope Withdrawal Time: 0 hours 9 minutes 46 seconds  Total Procedure Duration: 0 hours 13 minutes 36 seconds  Findings:                 The perianal and digital rectal examinations were                            normal.                           Two sessile polyps were found in the sigmoid colon                            and descending colon. The polyps were 6 to 7 mm in                            size. These polyps were removed with a cold snare.                            Resection and retrieval were complete.                           Multiple medium-mouthed diverticula were found in  the left colon.                           Internal hemorrhoids were found during                            retroflexion. The hemorrhoids were small and Grade                            I (internal hemorrhoids that do not prolapse).                           The exam was otherwise without abnormality on                            direct and retroflexion views. Complications:            No immediate complications. Estimated blood loss:                            None. Estimated Blood Loss:     Estimated blood loss: none. Impression:               - Two 6 to 7 mm polyps in the sigmoid colon and in                            the descending colon, removed with a cold snare.                            Resected and retrieved.                           - Diverticulosis in the left colon.                           - Internal hemorrhoids.                           - The examination was  otherwise normal on direct                            and retroflexion views. Recommendation:           - Repeat colonoscopy in 5 years for surveillance if                            polyp(s) are precancerous, otherwise 10 year with                            Dr. Raymer Cellar.                           - Patient has a contact number available for                            emergencies. The signs and symptoms of potential  delayed complications were discussed with the                            patient. Return to normal activities tomorrow.                            Written discharge instructions were provided to the                            patient.                           - Resume previous diet.                           - Continue present medications.                           - Await pathology results. Ladene Artist, MD 11/13/2017 3:05:22 PM This report has been signed electronically.

## 2017-11-13 NOTE — Progress Notes (Signed)
Report given to PACU, vss 

## 2017-11-13 NOTE — Progress Notes (Signed)
Called to room to assist during endoscopic procedure.  Patient ID and intended procedure confirmed with present staff. Received instructions for my participation in the procedure from the performing physician.  

## 2017-11-14 ENCOUNTER — Telehealth: Payer: Self-pay | Admitting: *Deleted

## 2017-11-14 ENCOUNTER — Telehealth: Payer: Self-pay

## 2017-11-14 NOTE — Telephone Encounter (Signed)
Follow up call made, left a voicemail. 

## 2017-11-14 NOTE — Telephone Encounter (Signed)
  Follow up Call-  Call back number 11/13/2017 10/22/2017  Post procedure Call Back phone  # [phone] cell.  Permission to leave phone message Yes Yes  Some recent data might be hidden     Patient questions:  Do you have a fever, pain , or abdominal swelling? No. Pain Score  0 *  Have you tolerated food without any problems? Yes.    Have you been able to return to your normal activities? Yes.    Do you have any questions about your discharge instructions: Diet   No. Medications  No  Follow up visit  No.  Do you have questions or concerns about your Care? No.  Actions: * If pain score is 4 or above: No action needed, pain <4.

## 2017-11-26 ENCOUNTER — Encounter: Payer: Self-pay | Admitting: Gastroenterology

## 2018-11-05 IMAGING — CT CT ABD-PELV W/ CM
2 of 5 series · 15 of 46 positions shown, 17 images · IV contrast (ISOVUE)
Comparison: 11/12/2007 CT

CLINICAL DATA: Upper and mid abdominal pain with black stools x5
days.

EXAM:
CT ABDOMEN AND PELVIS WITH CONTRAST
TECHNIQUE: Multidetector CT imaging of the abdomen and pelvis was performed
using the standard protocol following bolus administration of
intravenous contrast.
CONTRAST:  100 cc ODHH54-TQQ IOPAMIDOL (ODHH54-TQQ) INJECTION 61%

[Series 2: abd/pel with · axial · 0.84mm/px · z∈[+1158,+1538]mm · 12 of 90 slices shown, 14 images]
[im 7/90  soft-tissue]
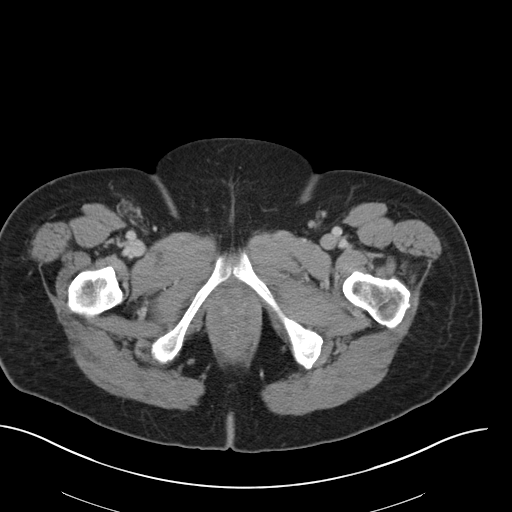
[im 7/90  bone]
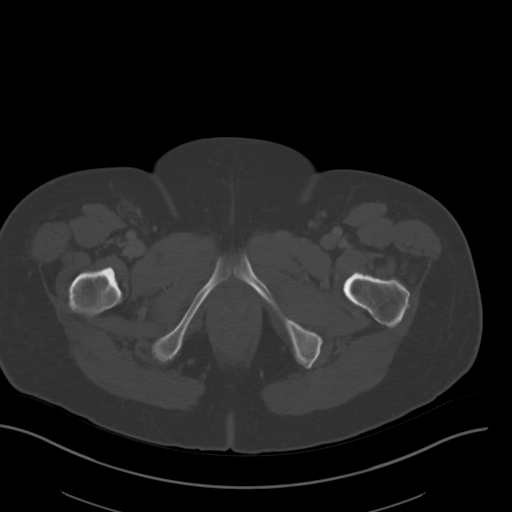
[im 14/90  soft-tissue]
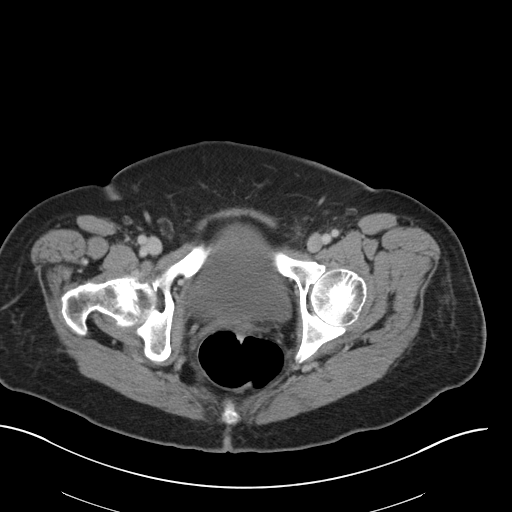
[im 21/90  soft-tissue]
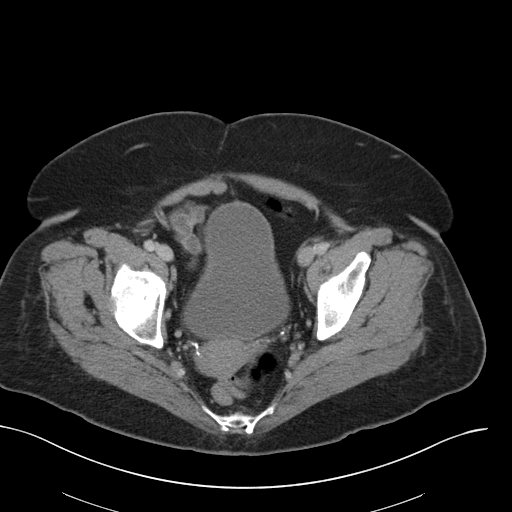
[im 28/90  soft-tissue]
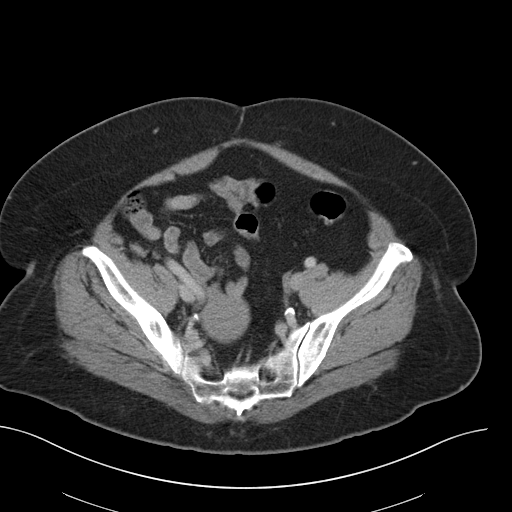
[im 35/90  soft-tissue]
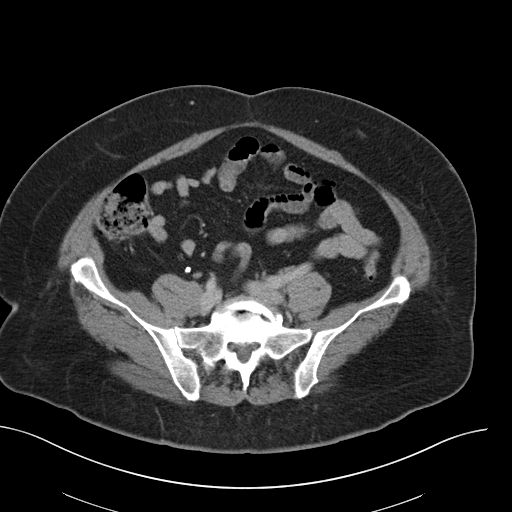
[im 42/90  soft-tissue]
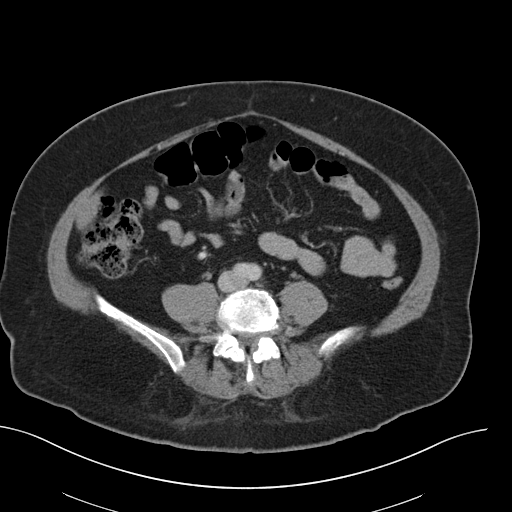
[im 48/90  soft-tissue]
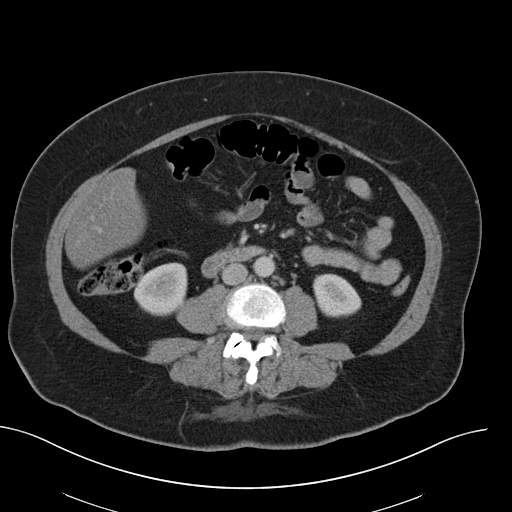
[im 55/90  soft-tissue]
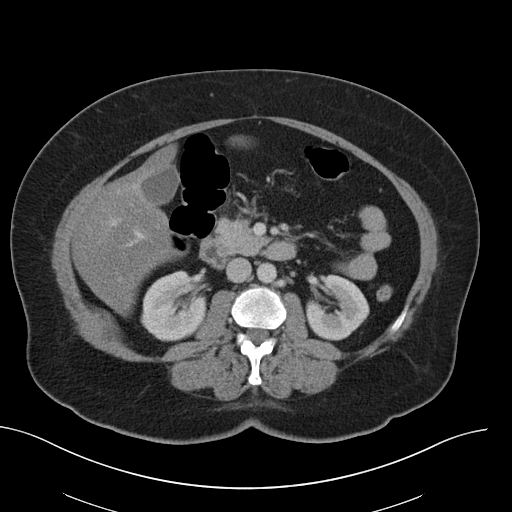
[im 62/90  soft-tissue]
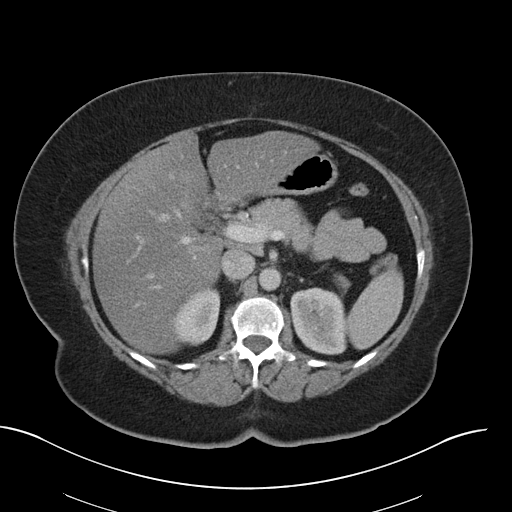
[im 62/90  bone]
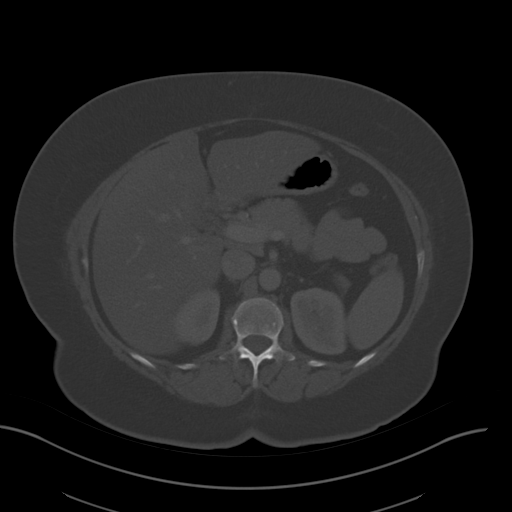
[im 69/90  soft-tissue]
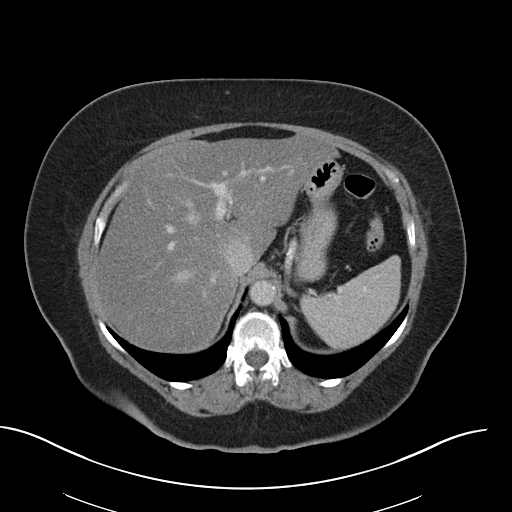
[im 76/90  soft-tissue]
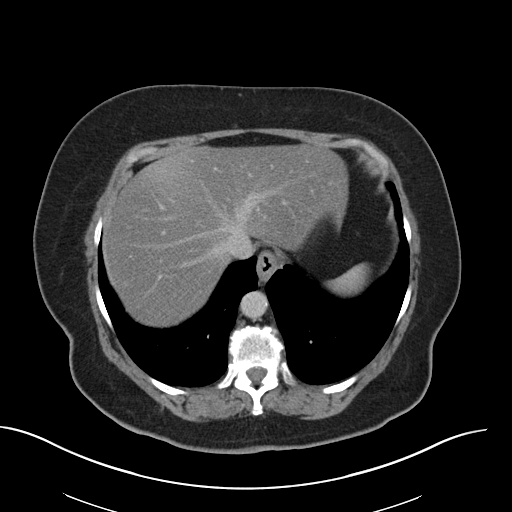
[im 83/90  soft-tissue]
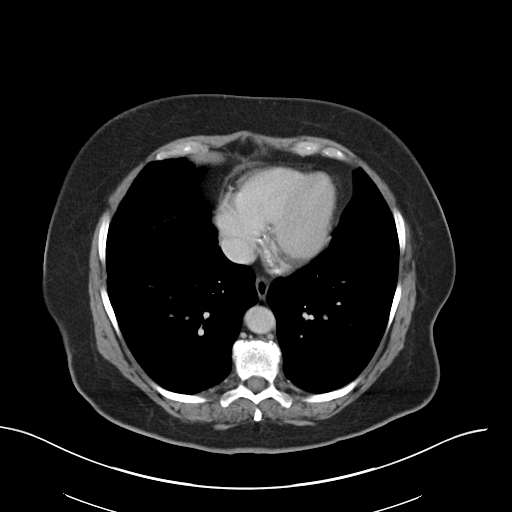

[Series 4: coronal a/|p · coronal · 0.74mm/px · 3 of 188 slices shown]
[im 63/188  soft-tissue]
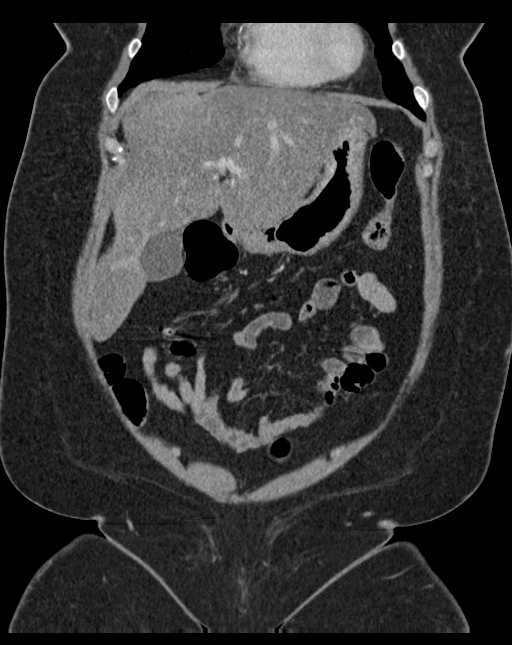
[im 84/188  soft-tissue]
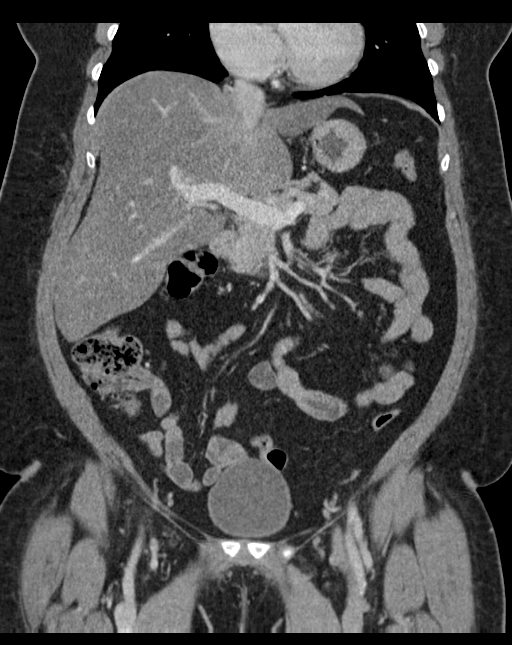
[im 104/188  soft-tissue]
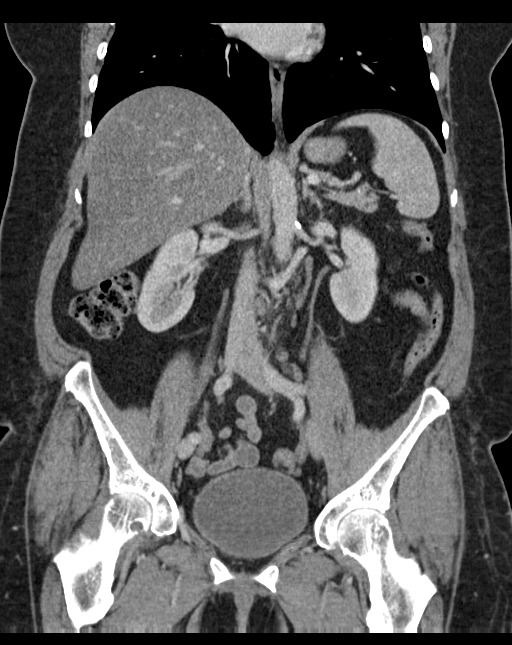

[15 of 46 positions shown; findings below may reference images not displayed]

FINDINGS: Lower chest: Normal size heart. No pericardial effusion. Dependent
atelectasis the lung bases.

Hepatobiliary: Hepatic steatosis. No biliary dilatation. No
gallstones or secondary signs acute cholecystitis. No
choledocholithiasis.

Pancreas: Normal

Spleen: Normal

Adrenals/Urinary Tract: Normal bilateral adrenal glands and kidneys.
No hydroureteronephrosis. Physiologic distention of the bladder
without focal mural thickening or calculus.

Stomach/Bowel: Nondistended stomach without wall thickening. Normal
small bowel rotation and ligament of Treitz position. No small bowel
inflammation or obstruction. Status post appendectomy by report. An
average amount of stool is seen retained within the cecum and
ascending colon. No mural thickening or inflammation is identified.
A few scattered colonic diverticula are noted along the distal
descending colon.

Vascular/Lymphatic: Mild aortoiliac atherosclerosis. Densely
calcified 1 cm splenic artery aneurysm at the hilum. No aneurysm or
adenopathy.

Reproductive: Uterus and bilateral adnexa are unremarkable.

Other: Tiny fat containing umbilical hernia. No abdominopelvic
ascites.

Musculoskeletal: T8 through T12 degenerative disc disease as well as
at L5-S1. No acute nor suspicious osseous abnormalities.
IMPRESSION: 1. Hepatic steatosis.
2. Scattered minimal colonic diverticulosis without acute
diverticulitis. No mural thickening, inflammation or bowel
obstruction.
3. Mild thoracic and lower lumbar spondylosis.

## 2022-11-01 ENCOUNTER — Encounter (HOSPITAL_COMMUNITY)
Admission: EM | Disposition: A | Discharge status: Home / Self Care | Payer: Self-pay | Source: Home / Self Care | Attending: Cardiology

## 2022-11-01 ENCOUNTER — Inpatient Hospital Stay (HOSPITAL_COMMUNITY)
Admission: EM | Admit: 2022-11-01 | Discharge: 2022-11-05 | DRG: 280 | Disposition: A | Discharge status: Home / Self Care | Payer: Medicare Other | Attending: Cardiology | Admitting: Cardiology

## 2022-11-01 ENCOUNTER — Encounter (HOSPITAL_COMMUNITY): Payer: Self-pay

## 2022-11-01 ENCOUNTER — Other Ambulatory Visit: Payer: Self-pay

## 2022-11-01 ENCOUNTER — Inpatient Hospital Stay (HOSPITAL_COMMUNITY): Payer: Medicare Other

## 2022-11-01 DIAGNOSIS — K648 Other hemorrhoids: Secondary | ICD-10-CM | POA: Diagnosis present

## 2022-11-01 DIAGNOSIS — K2971 Gastritis, unspecified, with bleeding: Secondary | ICD-10-CM | POA: Diagnosis not present

## 2022-11-01 DIAGNOSIS — K264 Chronic or unspecified duodenal ulcer with hemorrhage: Secondary | ICD-10-CM | POA: Diagnosis present

## 2022-11-01 DIAGNOSIS — K2980 Duodenitis without bleeding: Secondary | ICD-10-CM | POA: Diagnosis present

## 2022-11-01 DIAGNOSIS — Z83438 Family history of other disorder of lipoprotein metabolism and other lipidemia: Secondary | ICD-10-CM

## 2022-11-01 DIAGNOSIS — Z96659 Presence of unspecified artificial knee joint: Secondary | ICD-10-CM | POA: Diagnosis present

## 2022-11-01 DIAGNOSIS — I110 Hypertensive heart disease with heart failure: Secondary | ICD-10-CM | POA: Diagnosis not present

## 2022-11-01 DIAGNOSIS — I25110 Atherosclerotic heart disease of native coronary artery with unstable angina pectoris: Secondary | ICD-10-CM

## 2022-11-01 DIAGNOSIS — E6609 Other obesity due to excess calories: Secondary | ICD-10-CM | POA: Diagnosis present

## 2022-11-01 DIAGNOSIS — K921 Melena: Secondary | ICD-10-CM | POA: Diagnosis not present

## 2022-11-01 DIAGNOSIS — I214 Non-ST elevation (NSTEMI) myocardial infarction: Principal | ICD-10-CM | POA: Diagnosis present

## 2022-11-01 DIAGNOSIS — I509 Heart failure, unspecified: Secondary | ICD-10-CM

## 2022-11-01 DIAGNOSIS — E782 Mixed hyperlipidemia: Secondary | ICD-10-CM | POA: Diagnosis present

## 2022-11-01 DIAGNOSIS — Z86010 Personal history of colonic polyps: Secondary | ICD-10-CM | POA: Diagnosis not present

## 2022-11-01 DIAGNOSIS — E039 Hypothyroidism, unspecified: Secondary | ICD-10-CM | POA: Diagnosis not present

## 2022-11-01 DIAGNOSIS — I10 Essential (primary) hypertension: Secondary | ICD-10-CM

## 2022-11-01 DIAGNOSIS — E118 Type 2 diabetes mellitus with unspecified complications: Secondary | ICD-10-CM | POA: Diagnosis not present

## 2022-11-01 DIAGNOSIS — Z79620 Long term (current) use of immunosuppressive biologic: Secondary | ICD-10-CM

## 2022-11-01 DIAGNOSIS — K449 Diaphragmatic hernia without obstruction or gangrene: Secondary | ICD-10-CM | POA: Diagnosis not present

## 2022-11-01 DIAGNOSIS — D62 Acute posthemorrhagic anemia: Secondary | ICD-10-CM | POA: Diagnosis not present

## 2022-11-01 DIAGNOSIS — D509 Iron deficiency anemia, unspecified: Secondary | ICD-10-CM

## 2022-11-01 DIAGNOSIS — K5900 Constipation, unspecified: Secondary | ICD-10-CM | POA: Diagnosis present

## 2022-11-01 DIAGNOSIS — Z79890 Hormone replacement therapy (postmenopausal): Secondary | ICD-10-CM

## 2022-11-01 DIAGNOSIS — R1084 Generalized abdominal pain: Secondary | ICD-10-CM

## 2022-11-01 DIAGNOSIS — E119 Type 2 diabetes mellitus without complications: Secondary | ICD-10-CM

## 2022-11-01 DIAGNOSIS — I5031 Acute diastolic (congestive) heart failure: Secondary | ICD-10-CM | POA: Diagnosis present

## 2022-11-01 DIAGNOSIS — K315 Obstruction of duodenum: Secondary | ICD-10-CM | POA: Diagnosis present

## 2022-11-01 DIAGNOSIS — Z7983 Long term (current) use of bisphosphonates: Secondary | ICD-10-CM

## 2022-11-01 DIAGNOSIS — K760 Fatty (change of) liver, not elsewhere classified: Secondary | ICD-10-CM | POA: Diagnosis not present

## 2022-11-01 DIAGNOSIS — I2510 Atherosclerotic heart disease of native coronary artery without angina pectoris: Secondary | ICD-10-CM | POA: Insufficient documentation

## 2022-11-01 DIAGNOSIS — Z79899 Other long term (current) drug therapy: Secondary | ICD-10-CM

## 2022-11-01 DIAGNOSIS — K2970 Gastritis, unspecified, without bleeding: Secondary | ICD-10-CM | POA: Diagnosis not present

## 2022-11-01 DIAGNOSIS — E66811 Obesity, class 1: Secondary | ICD-10-CM

## 2022-11-01 DIAGNOSIS — Z8249 Family history of ischemic heart disease and other diseases of the circulatory system: Secondary | ICD-10-CM

## 2022-11-01 DIAGNOSIS — E781 Pure hyperglyceridemia: Secondary | ICD-10-CM

## 2022-11-01 DIAGNOSIS — K269 Duodenal ulcer, unspecified as acute or chronic, without hemorrhage or perforation: Secondary | ICD-10-CM

## 2022-11-01 DIAGNOSIS — K5730 Diverticulosis of large intestine without perforation or abscess without bleeding: Secondary | ICD-10-CM | POA: Diagnosis present

## 2022-11-01 DIAGNOSIS — Z6832 Body mass index (BMI) 32.0-32.9, adult: Secondary | ICD-10-CM

## 2022-11-01 DIAGNOSIS — K922 Gastrointestinal hemorrhage, unspecified: Secondary | ICD-10-CM

## 2022-11-01 DIAGNOSIS — I252 Old myocardial infarction: Secondary | ICD-10-CM | POA: Diagnosis not present

## 2022-11-01 DIAGNOSIS — Z780 Asymptomatic menopausal state: Secondary | ICD-10-CM

## 2022-11-01 DIAGNOSIS — Z01810 Encounter for preprocedural cardiovascular examination: Secondary | ICD-10-CM | POA: Diagnosis not present

## 2022-11-01 DIAGNOSIS — Z7984 Long term (current) use of oral hypoglycemic drugs: Secondary | ICD-10-CM

## 2022-11-01 HISTORY — DX: Mixed hyperlipidemia: E78.2

## 2022-11-01 HISTORY — PX: LEFT HEART CATH AND CORONARY ANGIOGRAPHY: CATH118249

## 2022-11-01 HISTORY — DX: Type 2 diabetes mellitus without complications: E11.9

## 2022-11-01 HISTORY — DX: Atherosclerotic heart disease of native coronary artery without angina pectoris: I25.10

## 2022-11-01 HISTORY — DX: Essential (primary) hypertension: I10

## 2022-11-01 HISTORY — DX: Non-ST elevation (NSTEMI) myocardial infarction: I21.4

## 2022-11-01 LAB — CBC
HCT: 26.9 % — ABNORMAL LOW (ref 36.0–46.0)
HCT: 19.7 % — ABNORMAL LOW (ref 36.0–46.0)
Hemoglobin: 8.5 g/dL — ABNORMAL LOW (ref 12.0–15.0)
Hemoglobin: 6.4 g/dL — CL (ref 12.0–15.0)
MCH: 28.9 pg (ref 26.0–34.0)
MCH: 29.5 pg (ref 26.0–34.0)
MCHC: 31.6 g/dL (ref 30.0–36.0)
MCHC: 32.5 g/dL (ref 30.0–36.0)
MCV: 91.5 fL (ref 80.0–100.0)
MCV: 90.8 fL (ref 80.0–100.0)
Platelets: 275 10*3/uL (ref 150–400)
Platelets: 241 10*3/uL (ref 150–400)
RBC: 2.94 MIL/uL — ABNORMAL LOW (ref 3.87–5.11)
RBC: 2.17 MIL/uL — ABNORMAL LOW (ref 3.87–5.11)
RDW: 14.6 % (ref 11.5–15.5)
RDW: 14.9 % (ref 11.5–15.5)
WBC: 11.9 10*3/uL — ABNORMAL HIGH (ref 4.0–10.5)
WBC: 13.1 10*3/uL — ABNORMAL HIGH (ref 4.0–10.5)
nRBC: 0 % (ref 0.0–0.2)
nRBC: 0 % (ref 0.0–0.2)

## 2022-11-01 LAB — BASIC METABOLIC PANEL
Anion gap: 12 (ref 5–15)
BUN: 13 mg/dL (ref 8–23)
CO2: 21 mmol/L — ABNORMAL LOW (ref 22–32)
Calcium: 9 mg/dL (ref 8.9–10.3)
Chloride: 105 mmol/L (ref 98–111)
Creatinine, Ser: 0.88 mg/dL (ref 0.44–1.00)
GFR, Estimated: >60 mL/min (ref >60)
Glucose, Bld: 266 mg/dL — ABNORMAL HIGH (ref 70–99)
Potassium: 3.9 mmol/L (ref 3.5–5.1)
Sodium: 138 mmol/L (ref 135–145)

## 2022-11-01 LAB — TSH: TSH: 8.243 u[IU]/mL — ABNORMAL HIGH (ref 0.350–4.500)

## 2022-11-01 LAB — TROPONIN I (HIGH SENSITIVITY)
Troponin I (High Sensitivity): 2065 ng/L (ref <18)
Troponin I (High Sensitivity): 2487 ng/L (ref <18)

## 2022-11-01 LAB — FERRITIN: Ferritin: 16 ng/mL (ref 11–307)

## 2022-11-01 LAB — IRON AND TIBC
Iron: 34 ug/dL (ref 28–170)
Saturation Ratios: 9 % — ABNORMAL LOW (ref 10.4–31.8)
TIBC: 400 ug/dL (ref 250–450)
UIBC: 366 ug/dL

## 2022-11-01 LAB — MRSA NEXT GEN BY PCR, NASAL: MRSA by PCR Next Gen: NOT DETECTED

## 2022-11-01 LAB — PREPARE RBC (CROSSMATCH)

## 2022-11-01 LAB — HEMOGLOBIN A1C
Hgb A1c MFr Bld: 8 % — ABNORMAL HIGH (ref 4.8–5.6)
Mean Plasma Glucose: 182.9 mg/dL

## 2022-11-01 LAB — GLUCOSE, CAPILLARY
Glucose-Capillary: 119 mg/dL — ABNORMAL HIGH (ref 70–99)
Glucose-Capillary: 112 mg/dL — ABNORMAL HIGH (ref 70–99)
Glucose-Capillary: 153 mg/dL — ABNORMAL HIGH (ref 70–99)

## 2022-11-01 LAB — BRAIN NATRIURETIC PEPTIDE: B Natriuretic Peptide: 283.4 pg/mL — ABNORMAL HIGH (ref 0.0–100.0)

## 2022-11-01 LAB — HIV ANTIBODY (ROUTINE TESTING W REFLEX): HIV Screen 4th Generation wRfx: NONREACTIVE

## 2022-11-01 SURGERY — LEFT HEART CATH AND CORONARY ANGIOGRAPHY
Anesthesia: LOCAL

## 2022-11-01 MED ORDER — SODIUM CHLORIDE 0.9% IV SOLUTION: Freq: Once | INTRAVENOUS | Status: DC

## 2022-11-01 MED ORDER — SODIUM CHLORIDE 0.9% FLUSH
3.0000 mL | Freq: Two times a day (BID) | INTRAVENOUS | Status: DC
  Administered 2022-11-02 – 2022-11-05 (×6): 3 mL via INTRAVENOUS

## 2022-11-01 MED ORDER — SODIUM CHLORIDE 0.9 % IV BOLUS
INTRAVENOUS | Status: AC | PRN
  Administered 2022-11-01: 500 mL/h via INTRAVENOUS

## 2022-11-01 MED ORDER — HEPARIN SODIUM (PORCINE) 1000 UNIT/ML IJ SOLN
INTRAMUSCULAR | Status: DC | PRN
  Administered 2022-11-01: 1000 [IU] via INTRA_ARTERIAL

## 2022-11-01 MED ORDER — PANTOPRAZOLE 80MG IVPB - SIMPLE MED
80.0000 mg | Freq: Once | INTRAVENOUS | Status: AC
  Administered 2022-11-01: 80 mg via INTRAVENOUS
  Filled 2022-11-01: qty 100

## 2022-11-01 MED ORDER — MIDAZOLAM HCL 2 MG/2ML IJ SOLN
INTRAMUSCULAR | Status: DC | PRN
  Administered 2022-11-01: 2 mg via INTRAVENOUS

## 2022-11-01 MED ORDER — HEPARIN SODIUM (PORCINE) 1000 UNIT/ML IJ SOLN
INTRAMUSCULAR | Status: AC
  Filled 2022-11-01: qty 10

## 2022-11-01 MED ORDER — VERAPAMIL HCL 2.5 MG/ML IV SOLN
INTRAVENOUS | Status: AC
  Filled 2022-11-01: qty 2

## 2022-11-01 MED ORDER — INSULIN ASPART 100 UNIT/ML IJ SOLN
0.0000 [IU] | INTRAMUSCULAR | Status: DC
  Administered 2022-11-02 (×2): 2 [IU] via SUBCUTANEOUS
  Administered 2022-11-03: 3 [IU] via SUBCUTANEOUS

## 2022-11-01 MED ORDER — ASPIRIN 81 MG PO TBEC
81.0000 mg | DELAYED_RELEASE_TABLET | Freq: Every day | ORAL | Status: DC
  Administered 2022-11-02 – 2022-11-05 (×4): 81 mg via ORAL
  Filled 2022-11-01 (×4): qty 1

## 2022-11-01 MED ORDER — MIDAZOLAM HCL 2 MG/2ML IJ SOLN
INTRAMUSCULAR | Status: AC
  Filled 2022-11-01: qty 2

## 2022-11-01 MED ORDER — HEPARIN SODIUM (PORCINE) 5000 UNIT/ML IJ SOLN
4000.0000 [IU] | Freq: Once | INTRAMUSCULAR | Status: AC
  Administered 2022-11-01: 4000 [IU] via INTRAVENOUS

## 2022-11-01 MED ORDER — TIROFIBAN HCL IN NACL 5-0.9 MG/100ML-% IV SOLN
0.1500 ug/kg/min | INTRAVENOUS | Status: DC
  Administered 2022-11-01: 0.15 ug/kg/min via INTRAVENOUS
  Filled 2022-11-01: qty 100

## 2022-11-01 MED ORDER — METOPROLOL TARTRATE 25 MG PO TABS
25.0000 mg | ORAL_TABLET | Freq: Two times a day (BID) | ORAL | Status: DC
  Filled 2022-11-01 (×2): qty 1

## 2022-11-01 MED ORDER — SODIUM CHLORIDE 0.9 % IV SOLN: INTRAVENOUS | Status: DC

## 2022-11-01 MED ORDER — HEPARIN (PORCINE) IN NACL 1000-0.9 UT/500ML-% IV SOLN
INTRAVENOUS | Status: AC
  Filled 2022-11-01: qty 1000

## 2022-11-01 MED ORDER — TIROFIBAN (AGGRASTAT) BOLUS VIA INFUSION
2100.0000 ug | Freq: Once | INTRAVENOUS | Status: AC
  Administered 2022-11-01: 2100 ug via INTRAVENOUS
  Filled 2022-11-01: qty 42

## 2022-11-01 MED ORDER — HEPARIN SODIUM (PORCINE) 5000 UNIT/ML IJ SOLN
5000.0000 [IU] | Freq: Three times a day (TID) | INTRAMUSCULAR | Status: DC
  Administered 2022-11-01 – 2022-11-04 (×8): 5000 [IU] via SUBCUTANEOUS
  Filled 2022-11-01 (×8): qty 1

## 2022-11-01 MED ORDER — SODIUM CHLORIDE 0.9 % IV SOLN: 250.0000 mL | INTRAVENOUS | Status: DC | PRN

## 2022-11-01 MED ORDER — LEVOTHYROXINE SODIUM 100 MCG PO TABS
100.0000 ug | ORAL_TABLET | Freq: Every day | ORAL | Status: DC
  Administered 2022-11-02 – 2022-11-05 (×4): 100 ug via ORAL
  Filled 2022-11-01 (×4): qty 1

## 2022-11-01 MED ORDER — ISOSORBIDE MONONITRATE ER 30 MG PO TB24
30.0000 mg | ORAL_TABLET | Freq: Every day | ORAL | Status: DC
  Administered 2022-11-01 – 2022-11-03 (×3): 30 mg via ORAL
  Filled 2022-11-01 (×3): qty 1

## 2022-11-01 MED ORDER — SODIUM CHLORIDE 0.9 % WEIGHT BASED INFUSION
1.0000 mL/kg/h | INTRAVENOUS | Status: AC
  Administered 2022-11-01: 1 mL/kg/h via INTRAVENOUS

## 2022-11-01 MED ORDER — SODIUM CHLORIDE 0.9 % IV SOLN
INTRAVENOUS | Status: AC | PRN
  Administered 2022-11-01: 10 mL/h via INTRAVENOUS

## 2022-11-01 MED ORDER — ONDANSETRON HCL 4 MG/2ML IJ SOLN: 4.0000 mg | Freq: Four times a day (QID) | INTRAMUSCULAR | Status: DC | PRN

## 2022-11-01 MED ORDER — EMPAGLIFLOZIN 25 MG PO TABS
25.0000 mg | ORAL_TABLET | Freq: Every day | ORAL | Status: DC
  Administered 2022-11-02 – 2022-11-05 (×4): 25 mg via ORAL
  Filled 2022-11-01 (×4): qty 1

## 2022-11-01 MED ORDER — ADULT MULTIVITAMIN W/MINERALS CH
1.0000 | ORAL_TABLET | Freq: Every day | ORAL | Status: DC
  Administered 2022-11-02 – 2022-11-05 (×4): 1 via ORAL
  Filled 2022-11-01 (×4): qty 1

## 2022-11-01 MED ORDER — FENTANYL CITRATE (PF) 100 MCG/2ML IJ SOLN
INTRAMUSCULAR | Status: DC | PRN
  Administered 2022-11-01: 25 ug via INTRAVENOUS

## 2022-11-01 MED ORDER — FENTANYL CITRATE (PF) 100 MCG/2ML IJ SOLN
INTRAMUSCULAR | Status: AC
  Filled 2022-11-01: qty 2

## 2022-11-01 MED ORDER — ACETAMINOPHEN 325 MG PO TABS
650.0000 mg | ORAL_TABLET | ORAL | Status: DC | PRN
  Administered 2022-11-01 – 2022-11-02 (×2): 650 mg via ORAL
  Filled 2022-11-01 (×2): qty 2

## 2022-11-01 MED ORDER — HEPARIN (PORCINE) 25000 UT/250ML-% IV SOLN
900.0000 [IU]/h | INTRAVENOUS | Status: DC
  Administered 2022-11-01: 900 [IU]/h via INTRAVENOUS
  Filled 2022-11-01: qty 250

## 2022-11-01 MED ORDER — LIDOCAINE HCL (PF) 1 % IJ SOLN
INTRAMUSCULAR | Status: AC
  Filled 2022-11-01: qty 30

## 2022-11-01 MED ORDER — LIDOCAINE HCL (PF) 1 % IJ SOLN
INTRAMUSCULAR | Status: DC | PRN
  Administered 2022-11-01: 2 mL

## 2022-11-01 MED ORDER — HEPARIN (PORCINE) IN NACL 1000-0.9 UT/500ML-% IV SOLN
INTRAVENOUS | Status: DC | PRN
  Administered 2022-11-01 (×2): 500 mL

## 2022-11-01 MED ORDER — VERAPAMIL HCL 2.5 MG/ML IV SOLN
INTRAVENOUS | Status: DC | PRN
  Administered 2022-11-01: 10 mL via INTRA_ARTERIAL

## 2022-11-01 MED ORDER — ATORVASTATIN CALCIUM 80 MG PO TABS
80.0000 mg | ORAL_TABLET | Freq: Every day | ORAL | Status: DC
  Administered 2022-11-01 – 2022-11-04 (×4): 80 mg via ORAL
  Filled 2022-11-01 (×4): qty 1

## 2022-11-01 MED ORDER — IOHEXOL 350 MG/ML SOLN
INTRAVENOUS | Status: DC | PRN
  Administered 2022-11-01: 70 mL

## 2022-11-01 MED ORDER — PANTOPRAZOLE SODIUM 40 MG IV SOLR
40.0000 mg | Freq: Two times a day (BID) | INTRAVENOUS | Status: DC
  Administered 2022-11-05: 40 mg via INTRAVENOUS
  Filled 2022-11-01: qty 10

## 2022-11-01 MED ORDER — PANTOPRAZOLE INFUSION (NEW) - SIMPLE MED
8.0000 mg/h | INTRAVENOUS | Status: DC
  Administered 2022-11-01 – 2022-11-02 (×2): 8 mg/h via INTRAVENOUS
  Filled 2022-11-01 (×3): qty 100

## 2022-11-01 MED ORDER — ALUM & MAG HYDROXIDE-SIMETH 200-200-20 MG/5ML PO SUSP
10.0000 mL | Freq: Four times a day (QID) | ORAL | Status: DC | PRN
  Administered 2022-11-02: 10 mL via ORAL
  Filled 2022-11-01: qty 30

## 2022-11-01 MED ORDER — SODIUM CHLORIDE 0.9 % IV SOLN
500.0000 mL | INTRAVENOUS | Status: DC
  Administered 2022-11-01 – 2022-11-03 (×5): 500 mL via INTRAVENOUS

## 2022-11-01 MED ORDER — PANTOPRAZOLE SODIUM 40 MG PO TBEC: 40.0000 mg | DELAYED_RELEASE_TABLET | Freq: Every day | ORAL | Status: DC

## 2022-11-01 MED ORDER — SODIUM CHLORIDE 0.9% FLUSH: 3.0000 mL | INTRAVENOUS | Status: DC | PRN

## 2022-11-01 MED ORDER — ATORVASTATIN CALCIUM 80 MG PO TABS: 80.0000 mg | ORAL_TABLET | Freq: Every day | ORAL | Status: DC

## 2022-11-01 MED ORDER — NITROGLYCERIN 0.4 MG SL SUBL: 0.4000 mg | SUBLINGUAL_TABLET | SUBLINGUAL | Status: DC | PRN

## 2022-11-01 MED ORDER — FOLIC ACID 1 MG PO TABS
1.0000 mg | ORAL_TABLET | Freq: Every day | ORAL | Status: DC
  Administered 2022-11-02 – 2022-11-05 (×4): 1 mg via ORAL
  Filled 2022-11-01 (×4): qty 1

## 2022-11-01 SURGICAL SUPPLY — 10 items
BAND CMPR LRG ZPHR (HEMOSTASIS) ×1
BAND ZEPHYR COMPRESS 30 LONG (HEMOSTASIS) IMPLANT
CATH OPTITORQUE TIG 4.0 5F (CATHETERS) IMPLANT
GLIDESHEATH SLEND A-KIT 6F 22G (SHEATH) IMPLANT
GUIDEWIRE INQWIRE 1.5J.035X260 (WIRE) IMPLANT
INQWIRE 1.5J .035X260CM (WIRE) ×1
KIT HEART LEFT (KITS) ×2 IMPLANT
PACK CARDIAC CATHETERIZATION (CUSTOM PROCEDURE TRAY) ×2 IMPLANT
TRANSDUCER W/STOPCOCK (MISCELLANEOUS) ×2 IMPLANT
TUBING CIL FLEX 10 FLL-RA (TUBING) ×2 IMPLANT

## 2022-11-01 NOTE — Interval H&P Note (Signed)
History and Physical Interval Note:  11/01/2022 2:17 PM  Mercedes Adams  has presented today for surgery, with the diagnosis of NSTEMI.  The various methods of treatment have been discussed with the patient and family. After consideration of risks, benefits and other options for treatment, the patient has consented to  Procedure(s): LEFT HEART CATH AND CORONARY ANGIOGRAPHY (N/A) as a surgical intervention.  The patient's history has been reviewed, patient examined, no change in status, stable for surgery.  I have reviewed the patient's chart and labs.  Questions were answered to the patient's satisfaction.   Patient personally seen by me and examined by me, history obtained, patient having active GI bleed with dark melanotic stool.  6 months ago hemoglobin was around 13, now down to 8.  We will proceed with diagnostic angiography in view of NSTEMI and abnormal EKG, to further restratify the patient.  Will obtain GI consultation as well.  Discussed with the patient and her brother.  Adrian Prows

## 2022-11-01 NOTE — ED Notes (Addendum)
[name] from lab informed this Probation officer of a Troponin of 2,065. MD notified.

## 2022-11-01 NOTE — Progress Notes (Signed)
ANTICOAGULATION CONSULT NOTE - Initial Consult  Pharmacy Consult for Heparin infusion and Tirofiban infusion Indication: chest pain/ACS  No Known Allergies  Patient Measurements: Height: '5\' 4"'$  (162.6 cm) Weight: 83.9 kg (185 lb) IBW/kg (Calculated) : 54.7 Heparin Dosing Weight: 73 kg  Vital Signs: Temp: 98.2 F (36.8 C) (11/02 1020) Temp Source: Oral (11/02 1020) BP: 135/77 (11/02 1020) Pulse Rate: 110 (11/02 1020)  Labs: Recent Labs    11/01/22 1029  HGB 8.5*  HCT 26.9*  PLT 275    CrCl cannot be calculated (Patient's most recent lab result is older than the maximum 21 days allowed.).   Medical History: Past Medical History:  Diagnosis Date   Anxiety    Arthritis    Diabetes mellitus without complication (HCC)    Heart murmur    Hyperlipidemia    Hypertension    Thyroid disease     Medications:  (Not in a hospital admission)   Assessment: 65 yo F experienced sternal, non-radiating chest pain while line dancing on 11/1. She reports it intermittently improved, but on her way to swim aerobics in AM on 11/2, pt developed dizziness and SOB. She went to urgent care and was sent to ED by EMS for ST changes on ECG.   Pt was not taking any anticoagulation prior to admission. Pharmacy was consulted to dose heparin infusion and tirofiban infusion per ACS protocol. Plan to go to cath lab once lab is open.   Goal of Therapy:  Heparin level 0.3-0.7 units/ml Monitor platelets by anticoagulation protocol: Yes   Plan:  Give heparin 4000 units IV bolus x 1 Start heparin infusion at 900 units/hr Give tirofiban 2100 mcg (25 mcg/kg) IV bolus x 1 Start tirofiban infusion at 0.15 mcg/kg/min Check heparin level and aPTT after cath lab Monitor daily CBC, heparin level, aPTT, and for s/sx of bleeding.  Luisa Hart, PharmD, BCPS Clinical Pharmacist 11/01/2022 11:12 AM   Please refer to AMION for pharmacy phone number

## 2022-11-01 NOTE — H&P (Signed)
HISTORY AND PHYSICAL  Patient ID: Mercedes Adams MRN: [id number] DOB/AGE: [birth year] 66 y.o.  Admit date: 11/01/2022 Attending physician: Rex Kras, DO Primary Physician:  Isaias Sakai, DO  Chief complaint: Chest pain  HPI:  Mercedes Adams is a 66 y.o. female who presents with a chief complaint of " chest pain." Her past medical history and cardiovascular risk factors include: Hypertension, hyperlipidemia, non-insulin-dependent diabetes mellitus type 2, postmenopausal female, advanced age, obesity.  In the usual state of health yesterday 10/31/2022 until she started having exertional chest pain while doing line dancing.  The symptoms are short-lived and therefore did not think much of it.  However the symptoms continued throughout the day, predominantly effort related, improved with resting, associated symptoms (dizziness, weakness, palpitations, pressure).  Due to ongoing discomfort she went to urgent care this morning and was brought to the ED for further evaluation given her symptoms and EKG findings.  In route she was given a full dose aspirin and her chest pain went from a 5 out of 10 to 2 out of 10.    Patient was evaluated upon arrival.  She is maintaining a MAP without pressor support.  Not hypoxic.  Nontoxic in appearance.  ALLERGIES: No Known Allergies  PAST MEDICAL HISTORY: Past Medical History:  Diagnosis Date   Anxiety    Arthritis    Diabetes mellitus without complication ([place])    Heart murmur    Hyperlipidemia    Hypertension    Thyroid disease     PAST SURGICAL HISTORY: Past Surgical History:  Procedure Laterality Date   APPENDECTOMY     CESAREAN SECTION     x 2   REPLACEMENT TOTAL KNEE     left knee    FAMILY HISTORY: The patient family history includes Heart failure in her father; High Cholesterol in her father and mother; Hypertension in her father and mother.   SOCIAL HISTORY:  The patient  reports that she has never smoked. She has never  used smokeless tobacco. She reports that she does not drink alcohol and does not use drugs.  MEDICATIONS: Current Outpatient Medications  Medication Instructions   aspirin EC (BAYER ASPIRIN EC LOW DOSE) 81 mg, Oral, Daily, Swallow whole.   folic acid (FOLVITE) 1 mg, Oral, Daily   ibuprofen (ADVIL) 200 mg, Oral, Every 6 hours PRN   levothyroxine (SYNTHROID) 100 mcg, Oral, Daily before breakfast   lisinopril-hydrochlorothiazide (PRINZIDE,ZESTORETIC) 10-12.5 MG tablet 1 tablet, Oral, Daily   lovastatin (MEVACOR) 20 mg, Oral, Daily at bedtime, 2 tablets at bedtime   metFORMIN (GLUCOPHAGE) 2,000 mg, Oral, Daily with breakfast   Methotrexate Sodium (METHOTREXATE, PF,) 50 MG/2ML injection Intravenous, Weekly   Multiple Vitamin (MULTIVITAMIN) capsule 1 capsule, Oral, Daily   omeprazole (PRILOSEC) 40 mg, Oral, Daily   omeprazole (PRILOSEC) 40 mg, Oral, Daily, Pt to take omeprazole 40 mg BID for 1 month, then decrease to q am.   sertraline (ZOLOFT) 50 mg, Oral, Daily     sodium chloride      REVIEW OF SYSTEMS: Review of Systems  Cardiovascular:  Positive for chest pain, dyspnea on exertion and palpitations. Negative for claudication, irregular heartbeat, leg swelling, near-syncope, orthopnea, paroxysmal nocturnal dyspnea and syncope.  Respiratory:  Positive for shortness of breath.   Hematologic/Lymphatic: Negative for bleeding problem.  Musculoskeletal:  Negative for muscle cramps and myalgias.  Neurological:  Negative for dizziness and light-headedness.  All other systems reviewed and are negative.   PHYSICAL EXAM:    11/01/2022   10:23  AM 11/01/2022   10:20 AM 11/13/2017    3:25 PM  Vitals with BMI  Height '5\' 4"'$     Weight 185 lbs    BMI 02.72    Systolic  536 644  Diastolic  77 69  Pulse  034 77    No intake or output data in the 24 hours ending 11/01/22 1034  Net IO Since Admission: No IO data has been entered for this period [11/01/22 1034] Physical Exam  Constitutional: No  distress.  Age appropriate, hemodynamically stable.   Neck: No JVD present.  Cardiovascular: Normal rate, regular rhythm, S1 normal, S2 normal, intact distal pulses and normal pulses. Exam reveals no gallop, no S3 and no S4.  No murmur heard. Pulses:      Dorsalis pedis pulses are 2+ on the right side and 2+ on the left side.       Posterior tibial pulses are 2+ on the right side and 2+ on the left side.  Pulmonary/Chest: Effort normal and breath sounds normal. No stridor. She has no wheezes. She has no rales.  Abdominal: Soft. Bowel sounds are normal. She exhibits no distension. There is no abdominal tenderness.  Musculoskeletal:        General: No edema.     Cervical back: Neck supple.  Neurological: She is alert and oriented to person, place, and time. She has intact cranial nerves (2-12).  Skin: Skin is warm and moist.   RADIOLOGY: No results found.  LABORATORY DATA: Lab Results  Component Value Date   WBC 11.9 (H) 11/01/2022   HGB 8.5 (L) 11/01/2022   HCT 26.9 (L) 11/01/2022   MCV 91.5 11/01/2022   PLT 275 11/01/2022   No results for input(s): "NA", "K", "CL", "CO2", "BUN", "CREATININE", "CALCIUM", "PROT", "BILITOT", "ALKPHOS", "ALT", "AST", "GLUCOSE" in the last 168 hours.  Invalid input(s): "LABALBU"  Lipid Panel  No results found for: "CHOL", "HDL", "LDLCALC", "LDLDIRECT", "TRIG", "CHOLHDL"  BNP (last 3 results) No results for input(s): "BNP" in the last 8760 hours.  HEMOGLOBIN A1C Lab Results  Component Value Date   HGBA1C 8.0 (H) 11/01/2022   MPG 182.9 11/01/2022    Cardiac Panel (last 3 results) No results for input(s): "CKTOTAL", "CKMB", "RELINDX" in the last 8760 hours.  Invalid input(s): "TROPONINHS"  No results found for: "CKTOTAL", "CKMB", "CKMBINDEX"   TSH No results for input(s): "TSH" in the last 8760 hours.    CARDIAC DATABASE: EKG: 11/01/2022: 9:42 AM: Sinus tachycardia, 108 bpm, diffuse ST depressions. 1018: Sinus tachycardia, 110 bpm,  diffuse ST depressions concerning for multivessel CAD.  Echocardiogram: Pending  Heart Catheterization: Pending  IMPRESSION & RECOMMENDATIONS: Mercedes Adams is a 66 y.o. Caucasian female whose past medical history and cardiovascular risk factors include:  Hypertension, hyperlipidemia, non-insulin-dependent diabetes mellitus type 2, postmenopausal female, advanced age, obesity.  Impression:  NSTEMI Non-insulin-dependent diabetes mellitus type 2 Hyperlipidemia. Hypertension. Obesity due to excess calories  Recommendations: NSTEMI Given the chest pain and EKG changes patient rules in for NSTEMI. High sensitive troponins forthcoming. Presentation very concerning for ACS with onset at 10/31/2022. Spoke to interventional cardiologist Dr. Einar Gip who is currently involved in another STEMI and the shared decision was to start her on IV heparin per ACS protocol and Aggrastat and patient will be taken to the Cath Lab urgently to rule out obstructive CAD. Risks, benefits, and alternatives to a left heart catheterization discussed with the patient.  Patient is agreeable to proceed forward. I also spoke to the patient's husband Chrissie Noa over  the phone as well. Trend troponin x2. Check BNP, TSH, A1c, fasting lipid profile, direct LDL, LP(a). Echo will be ordered to evaluate for structural heart disease and left ventricular systolic function. EMS gave 325 mg ASA and continue 81 mg daily. Start atorvastatin 80 mg p.o. daily  Currently chest pain-free after given full dose aspirin. Remain on telemetry and obtain a stat EKG should chest pain return or change in character/quality.   Should symptoms of chest pain continue despite 3 sublingual nitroglycerin tablets, the patient becomes hemodynamically unstable (hypotension, congestive heart failure), or develop electrical instability (VT/VF), please notify cardiology on call for urgent evaluation.   Non-insulin-dependent diabetes mellitus type 2: Check  A1c. Insulin sliding scale. Hold metformin. We will continue Jardiance  Hypertension: We will resume home medications.   Hyperlipidemia: Check fasting lipid profile, direct LDL, LP(a). Start atorvastatin 80 mg p.o. nightly  Consultants: None   Code Status: Full    Family Communication:  Spoke to husband - [phone]    Disposition Plan: Home    CRITICAL CARE Performed by: Rex Kras   Total critical care time: 40 minutes   Critical care time was exclusive of separately billable procedures and treating other patients.   Critical care was necessary to treat or prevent imminent or life-threatening deterioration given her presentation of NSTEMI.   Critical care was time spent personally by me on the following activities: development of treatment plan with patient and/or surrogate as well as nursing, discussions with consultants (interventional cardiology Dr. Einar Gip), ED provider, ENT, and pharmacy staff, evaluation of patient's response to treatment, examination of patient, obtaining history from patient or surrogate, ordering and performing treatments and interventions, ordering and review of laboratory studies, ordering and review of radiographic studies, pulse oximetry and re-evaluation of patient's condition.  Patient's questions and concerns were addressed to her satisfaction. She voices understanding of the instructions provided during this encounter.   This note was created using a voice recognition software as a result there may be grammatical errors inadvertently enclosed that do not reflect the nature of this encounter. Every attempt is made to correct such errors.  Mechele Claude [facility]  Pager: [phone] Office: [phone] 11/01/2022, 10:34 AM

## 2022-11-01 NOTE — H&P (View-Only) (Signed)
Consultation Note   Referring Provider: [facility] PCP: Isaias Sakai, DO Primary Gastroenterologist: Vass Cellar, MD Reason for consultation: GI bleed with black stool  Hospital Day: 1  Assessment    # 66 yo admitted today with NSTEMI we were called to see for evaluation of black stool. She has been having nausea, non-bloody emesis and black stool since last week.   Discussed with Cardiologist. At some point in a few weeks patient will need either a cardiac bypass or stenting and will need to be anti-coagulated. Though there is some risk in doing EGD with a fresh NSTEMI, it is probably best to get it done before she requires PCI / CABG / anticoagulation.   Will discuss further with Dr. Lyndel Safe. In the interim needs PPI infusion. Will make NPO after MN for probable EGD.   # [place] anemia ( ? secondary to acute blood loss). Hgb 8.5. . Baseline hgb not known though it was 8-10 in 2018. She reports having labs in between then and now so I assume her hgb hasn't been 8 for very long. This may be anemia of acute blood loss.  Hemoccult  Iron studies  # See PMH for additional medical problems   HPI   Mercedes Adams is a 66 y.o. female with a past medical history significant for PUD, adenomatous colon polyps. See PMH for any additional medical problems.  Patient presented to ED today with chest pain. Had abnormal EKG, elevated troponin. Admitted with NSTEMI. 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   Mercedes Adams is a little sleepy after cardiac cath so details are lacking. She endorses black tarry stool since last week. No iron or bismuth use. She endorses some intermittent nausea / non-bloody emesis as well. She has had some "stomach" pain but points to lower abdomen. She takes Ibuprofen but says no more than once a month,  Denies any other NSAID use. Omeprazole on home  med list but says she takes it as needed ( maybe once a month). She had a small black BM this am.   Previous GI Evaluation    Oct 2018 EGD -Esophagogastric landmarks identified. - 2 cm hiatal hernia. - Normal esophagus - Normal stomach. Biopsied for H pylori testing given duodenal ulcer noted on this exam. - One non-bleeding clean based duodenal ulcer with no stigmata of bleeding, associated with edema / narrowed lumen and duodenitis. The area of edema / narrowed lumen near the ulcer was biopsied Surgical [P], duodenal BX around duodenal ulcer site - PEPTIC DUODENITIS. - NO DYSPLASIA OR MALIGNANCY. 2. Surgical [P], gastric antrum and gastric body - BENIGN GASTRIC MUCOSA. - NEGATIVE FOR HELICOBACTER PYLORI. - NO INTESTINAL METAPLASIA, DYSPLASIA, OR MALIGNANCY  Nov 2018 colonoscopy  -Two 6 to 7 mm polyps in the sigmoid colon and in the descending colon, removed with a cold snare. Resected and retrieved. - Diverticulosis in the left colon. - Internal hemorrhoids. - The examination was otherwise normal on direct and retroflexion views.ov 2018 Colonoscopy  -Surgical [P], sigmoid and descending, polyp (2) - TUBULAR ADENOMA (X2 FRAGMENTS). - NO HIGH GRADE DYSPLASIA OR MALIGNANCY  Recent Labs and Imaging DG [facility] [address]  Result Date: 11/01/2022 CLINICAL DATA:  Chest pressure. EXAM: PORTABLE CHEST 1 VIEW COMPARISON:  03/26/2007 FINDINGS: The heart size and mediastinal contours are within normal limits. Both lungs are clear. The visualized skeletal structures are unremarkable. IMPRESSION: No active disease. Electronically Signed   By: Kathreen Devoid M.D.   On: 11/01/2022 11:28    Labs:  Recent Labs    11/01/22 1029  WBC 11.9*  HGB 8.5*  HCT 26.9*  PLT 275   Recent Labs    11/01/22 1029  NA 138  K 3.9  CL 105  CO2 21*  GLUCOSE 266*  BUN 13  CREATININE 0.88  CALCIUM 9.0   No results for input(s): "PROT", "ALBUMIN", "AST", "ALT", "ALKPHOS", "BILITOT", "BILIDIR", "IBILI" in the  last 72 hours. No results for input(s): "HEPBSAG", "HCVAB", "HEPAIGM", "HEPBIGM" in the last 72 hours. No results for input(s): "LABPROT", "INR" in the last 72 hours.  Past Medical History:  Diagnosis Date   Anxiety    Arthritis    Diabetes mellitus without complication (HCC)    Heart murmur    Hyperlipidemia    Hypertension    Thyroid disease     Past Surgical History:  Procedure Laterality Date   APPENDECTOMY     CESAREAN SECTION     x 2   REPLACEMENT TOTAL KNEE     left knee    Family History  Problem Relation Age of Onset   Hypertension Mother    High Cholesterol Mother    Hypertension Father    High Cholesterol Father    Heart failure Father     Prior to Admission medications   Medication Sig Start Date End Date Taking? Authorizing Provider  alendronate (FOSAMAX) 70 MG tablet Take 70 mg by mouth once a week. 07/27/22  Yes [provider]  alum & mag hydroxide-simeth (MAALOX PLUS) 400-400-40 MG/5ML suspension Take 10 mLs by mouth every 6 (six) hours as needed for indigestion.   Yes [provider]  folic acid (FOLVITE) 1 MG tablet Take 1 mg by mouth daily. 09/06/17  Yes [provider]  ibuprofen (ADVIL,MOTRIN) 200 MG tablet Take 200 mg by mouth every 6 (six) hours as needed.   Yes [provider]  JARDIANCE 25 MG TABS tablet Take 25 mg by mouth daily.   Yes [provider]  levothyroxine (SYNTHROID, LEVOTHROID) 100 MCG tablet Take 100 mcg by mouth daily before breakfast.   Yes [provider]  lisinopril (ZESTRIL) 5 MG tablet Take 5 mg by mouth daily. 09/18/22  Yes [provider]  lisinopril-hydrochlorothiazide (PRINZIDE,ZESTORETIC) 10-12.5 MG tablet Take 1 tablet by mouth daily.   Yes [provider]  lovastatin (MEVACOR) 20 MG tablet Take 20 mg by mouth at bedtime.   Yes [provider]  metFORMIN (GLUCOPHAGE) 1000 MG tablet Take 2,000 mg by mouth in the morning and at bedtime.   Yes  [provider]  Multiple Vitamin (MULTIVITAMIN) capsule Take 1 capsule by mouth daily.   Yes [provider]  omeprazole (PRILOSEC) 40 MG capsule Take 40 mg by mouth daily. 10/16/17  Yes [provider]  Methotrexate Sodium (METHOTREXATE, PF,) 50 MG/2ML injection Inject into the vein once a week.    [provider]  omeprazole (PRILOSEC) 40 MG capsule Take 1 capsule (40 mg total) by mouth daily. Pt to take omeprazole 40 mg BID for 1 month, then decrease to q am. Patient not taking: Reported on 11/01/2022 10/22/17   Armbruster, Carlota Raspberry, MD  Current Facility-Administered Medications  Medication Dose Route Frequency Provider Last Rate Last Admin   0.9 %  sodium chloride infusion    Continuous PRN Adrian Prows, MD 10 mL/hr at 11/01/22 1435 10 mL/hr at 11/01/22 1435   [MAR Hold] acetaminophen (TYLENOL) tablet 650 mg  650 mg Oral Q4H PRN Tolia, Sunit, DO       [MAR Hold] aspirin EC tablet 81 mg  81 mg Oral Daily Tolia, Sunit, DO       [MAR Hold] atorvastatin (LIPITOR) tablet 80 mg  80 mg Oral Daily Tolia, Sunit, DO       fentaNYL (SUBLIMAZE) injection    PRN Adrian Prows, MD   25 mcg at 11/01/22 1427   Heparin (Porcine) in NaCl 1000-0.9 UT/500ML-% SOLN    PRN Adrian Prows, MD   500 mL at 11/01/22 1415   heparin ADULT infusion 100 units/mL (25000 units/262m)  900 Units/hr Intravenous Continuous Tolia, Sunit, DO 9 mL/hr at 11/01/22 1100 900 Units/hr at 11/01/22 1100   heparin sodium (porcine) injection    PRN GAdrian Prows MD   1,000 Units at 11/01/22 1438   [MAR Hold] insulin aspart (novoLOG) injection 0-15 Units  0-15 Units Subcutaneous Q4H Tolia, Sunit, DO       iohexol (OMNIPAQUE) 350 MG/ML injection    PRN GAdrian Prows MD   70 mL at 11/01/22 1453   lidocaine (PF) (XYLOCAINE) 1 % injection    PRN GAdrian Prows MD   2 mL at 11/01/22 1436   midazolam (VERSED) injection    PRN GAdrian Prows MD   2 mg at 11/01/22 1427   [MAR Hold] nitroGLYCERIN (NITROSTAT) SL tablet 0.4 mg   0.4 mg Sublingual Q5 Min x 3 PRN Tolia, Sunit, DO       [MAR Hold] ondansetron (ZOFRAN) injection 4 mg  4 mg Intravenous Q6H PRN Tolia, Sunit, DO       Radial Cocktail/Verapamil only    PRN GAdrian Prows MD   10 mL at 11/01/22 1436   sodium chloride 0.9 % bolus    Continuous PRN GAdrian Prows MD 500 mL/hr at 11/01/22 1435 500 mL/hr at 11/01/22 1435    Allergies as of 11/01/2022   (No Known Allergies)    Social History   Socioeconomic History   Marital status: Married    Spouse name: Not on file   Number of children: 2   Years of education: Not on file   Highest education level: Not on file  Occupational History   Not on file  Tobacco Use   Smoking status: Never   Smokeless tobacco: Never  Vaping Use   Vaping Use: Never used  Substance and Sexual Activity   Alcohol use: No   Drug use: No   Sexual activity: Not on file  Other Topics Concern   Not on file  Social History Narrative   Not on file   Social Determinants of Health   Financial Resource Strain: Not on file  Food Insecurity: Not on file  Transportation Needs: Not on file  Physical Activity: Not on file  Stress: Not on file  Social Connections: Not on file  Intimate Partner Violence: Not on file    Review of Systems: All systems reviewed and negative except where noted in HPI.  Physical Exam: Vital signs in last 24 hours: Temp:  [98.2 F (36.8 C)] 98.2 F (36.8 C) (11/02 1020) Pulse Rate:  [88-110] 91 (11/02 1200) Resp:  [13-19] 14 (11/02 1200) BP: (106-135)/(62-77) 106/62 (11/02 1200)  SpO2:  [96 %-99 %] 98 % (11/02 1413) Weight:  [83.9 kg] 83.9 kg (11/02 1023)    General:  Alert female in NAD Psych:  Pleasant, cooperative. Normal mood and affect Eyes: Pupils equal Ears:  Normal auditory acuity Nose: No deformity, discharge or lesions Neck:  Supple, no masses felt Lungs:  Clear to auscultation.  Heart:  Regular rate, regular rhythm. No lower extremity edema Abdomen:  Soft, nondistended, nontender,  active bowel sounds, no masses felt Rectal :  Deferred Msk: Symmetrical without gross deformities.  Neurologic:  Alert, oriented, grossly normal neurologically Skin:  Intact without significant lesions.    Intake/Output from previous day: No intake/output data recorded. Intake/Output this shift:  Total I/O In: 43.8 [I.V.:43.8] Out: -     Principal Problem:   NSTEMI (non-ST elevated myocardial infarction) ([place]) Active Problems:   DM (diabetes mellitus) ([place])   Essential hypertension, benign   Mixed hyperlipidemia    Tye Savoy, NP-C @  11/01/2022, 2:54 PM   Attending physician's note   I have taken history, reviewed the chart and examined the patient. I performed a substantive portion of this encounter, including complete performance of at least one of the key components, in conjunction with the APP. I agree with the Advanced Practitioner's note, impression and recommendations.   Melena with anemia Hb 8.5 (baseline 10). HD stable. Last EGD 09/2017 showed small HH, DU. Pt taking PPI on as needed basis. H/O ibuprofen use.  Adm with NSTEMI s/p cardiac cath showing 3V Dz, EF 30-35%. Not on AC.  H/O polyps. Nov 2018. Next elective colon due Nov 2025.  Fatty liver. No cirrhosis.  Plan: -IV protonix. -Ok to proceed with EGD per cardiology.  She would require PCI/CABG/anticoagulation in future.  Tentatively scheduled for tomorrow morning.  Explained risks and benefits. -Trend CBC. Keep Hb>8.   Carmell Austria, MD [facility] [phone]

## 2022-11-01 NOTE — Consult Note (Addendum)
Consultation Note   Referring Provider: [facility] PCP: Isaias Sakai, DO Primary Gastroenterologist: [place] Cellar, MD Reason for consultation: GI bleed with black stool  Hospital Day: 1  Assessment    # 66 yo admitted today with NSTEMI we were called to see for evaluation of black stool. She has been having nausea, non-bloody emesis and black stool since last week.   Discussed with Cardiologist. At some point in a few weeks patient will need either a cardiac bypass or stenting and will need to be anti-coagulated. Though there is some risk in doing EGD with a fresh NSTEMI, it is probably best to get it done before she requires PCI / CABG / anticoagulation.   Will discuss further with Dr. Lyndel Safe. In the interim needs PPI infusion. Will make NPO after MN for probable EGD.   # Tuttle anemia ( ? secondary to acute blood loss). Hgb 8.5. . Baseline hgb not known though it was 8-10 in 2018. She reports having labs in between then and now so I assume her hgb hasn't been 8 for very long. This may be anemia of acute blood loss.  Hemoccult  Iron studies  # See PMH for additional medical problems   HPI   Mercedes Adams is a 66 y.o. female with a past medical history significant for PUD, adenomatous colon polyps. See PMH for any additional medical problems.  Patient presented to ED today with chest pain. Had abnormal EKG, elevated troponin. Admitted with NSTEMI. 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   Mercedes Adams is a little sleepy after cardiac cath so details are lacking. She endorses black tarry stool since last week. No iron or bismuth use. She endorses some intermittent nausea / non-bloody emesis as well. She has had some "stomach" pain but points to lower abdomen. She takes Ibuprofen but says no more than once a month,  Denies any other NSAID use. Omeprazole on home  med list but says she takes it as needed ( maybe once a month). She had a small black BM this am.   Previous GI Evaluation    Oct 2018 EGD -Esophagogastric landmarks identified. - 2 cm hiatal hernia. - Normal esophagus - Normal stomach. Biopsied for H pylori testing given duodenal ulcer noted on this exam. - One non-bleeding clean based duodenal ulcer with no stigmata of bleeding, associated with edema / narrowed lumen and duodenitis. The area of edema / narrowed lumen near the ulcer was biopsied Surgical [P], duodenal BX around duodenal ulcer site - PEPTIC DUODENITIS. - NO DYSPLASIA OR MALIGNANCY. 2. Surgical [P], gastric antrum and gastric body - BENIGN GASTRIC MUCOSA. - NEGATIVE FOR HELICOBACTER PYLORI. - NO INTESTINAL METAPLASIA, DYSPLASIA, OR MALIGNANCY  Nov 2018 colonoscopy  -Two 6 to 7 mm polyps in the sigmoid colon and in the descending colon, removed with a cold snare. Resected and retrieved. - Diverticulosis in the left colon. - Internal hemorrhoids. - The examination was otherwise normal on direct and retroflexion views.ov 2018 Colonoscopy  -Surgical [P], sigmoid and descending, polyp (2) - TUBULAR ADENOMA (X2 FRAGMENTS). - NO HIGH GRADE DYSPLASIA OR MALIGNANCY  Recent Labs and Imaging DG Chest [facility] 1 [address]  Result Date: 11/01/2022 CLINICAL DATA:  Chest pressure. EXAM: PORTABLE CHEST 1 VIEW COMPARISON:  03/26/2007 FINDINGS: The heart size and mediastinal contours are within normal limits. Both lungs are clear. The visualized skeletal structures are unremarkable. IMPRESSION: No active disease. Electronically Signed   By: Kathreen Devoid M.D.   On: 11/01/2022 11:28    Labs:  Recent Labs    11/01/22 1029  WBC 11.9*  HGB 8.5*  HCT 26.9*  PLT 275   Recent Labs    11/01/22 1029  NA 138  K 3.9  CL 105  CO2 21*  GLUCOSE 266*  BUN 13  CREATININE 0.88  CALCIUM 9.0   No results for input(s): "PROT", "ALBUMIN", "AST", "ALT", "ALKPHOS", "BILITOT", "BILIDIR", "IBILI" in the  last 72 hours. No results for input(s): "HEPBSAG", "HCVAB", "HEPAIGM", "HEPBIGM" in the last 72 hours. No results for input(s): "LABPROT", "INR" in the last 72 hours.  Past Medical History:  Diagnosis Date   Anxiety    Arthritis    Diabetes mellitus without complication (HCC)    Heart murmur    Hyperlipidemia    Hypertension    Thyroid disease     Past Surgical History:  Procedure Laterality Date   APPENDECTOMY     CESAREAN SECTION     x 2   REPLACEMENT TOTAL KNEE     left knee    Family History  Problem Relation Age of Onset   Hypertension Mother    High Cholesterol Mother    Hypertension Father    High Cholesterol Father    Heart failure Father     Prior to Admission medications   Medication Sig Start Date End Date Taking? Authorizing Provider  alendronate (FOSAMAX) 70 MG tablet Take 70 mg by mouth once a week. 07/27/22  Yes [provider]  alum & mag hydroxide-simeth (MAALOX PLUS) 400-400-40 MG/5ML suspension Take 10 mLs by mouth every 6 (six) hours as needed for indigestion.   Yes [provider]  folic acid (FOLVITE) 1 MG tablet Take 1 mg by mouth daily. 09/06/17  Yes [provider]  ibuprofen (ADVIL,MOTRIN) 200 MG tablet Take 200 mg by mouth every 6 (six) hours as needed.   Yes [provider]  JARDIANCE 25 MG TABS tablet Take 25 mg by mouth daily.   Yes [provider]  levothyroxine (SYNTHROID, LEVOTHROID) 100 MCG tablet Take 100 mcg by mouth daily before breakfast.   Yes [provider]  lisinopril (ZESTRIL) 5 MG tablet Take 5 mg by mouth daily. 09/18/22  Yes [provider]  lisinopril-hydrochlorothiazide (PRINZIDE,ZESTORETIC) 10-12.5 MG tablet Take 1 tablet by mouth daily.   Yes [provider]  lovastatin (MEVACOR) 20 MG tablet Take 20 mg by mouth at bedtime.   Yes [provider]  metFORMIN (GLUCOPHAGE) 1000 MG tablet Take 2,000 mg by mouth in the morning and at bedtime.   Yes  [provider]  Multiple Vitamin (MULTIVITAMIN) capsule Take 1 capsule by mouth daily.   Yes [provider]  omeprazole (PRILOSEC) 40 MG capsule Take 40 mg by mouth daily. 10/16/17  Yes [provider]  Methotrexate Sodium (METHOTREXATE, PF,) 50 MG/2ML injection Inject into the vein once a week.    [provider]  omeprazole (PRILOSEC) 40 MG capsule Take 1 capsule (40 mg total) by mouth daily. Pt to take omeprazole 40 mg BID for 1 month, then decrease to q am. Patient not taking: Reported on 11/01/2022 10/22/17   Armbruster, Carlota Raspberry, MD  Current Facility-Administered Medications  Medication Dose Route Frequency Provider Last Rate Last Admin   0.9 %  sodium chloride infusion    Continuous PRN Adrian Prows, MD 10 mL/hr at 11/01/22 1435 10 mL/hr at 11/01/22 1435   [MAR Hold] acetaminophen (TYLENOL) tablet 650 mg  650 mg Oral Q4H PRN Tolia, Sunit, DO       [MAR Hold] aspirin EC tablet 81 mg  81 mg Oral Daily Tolia, Sunit, DO       [MAR Hold] atorvastatin (LIPITOR) tablet 80 mg  80 mg Oral Daily Tolia, Sunit, DO       fentaNYL (SUBLIMAZE) injection    PRN Adrian Prows, MD   25 mcg at 11/01/22 1427   Heparin (Porcine) in NaCl 1000-0.9 UT/500ML-% SOLN    PRN Adrian Prows, MD   500 mL at 11/01/22 1415   heparin ADULT infusion 100 units/mL (25000 units/260m)  900 Units/hr Intravenous Continuous Tolia, Sunit, DO 9 mL/hr at 11/01/22 1100 900 Units/hr at 11/01/22 1100   heparin sodium (porcine) injection    PRN GAdrian Prows MD   1,000 Units at 11/01/22 1438   [MAR Hold] insulin aspart (novoLOG) injection 0-15 Units  0-15 Units Subcutaneous Q4H Tolia, Sunit, DO       iohexol (OMNIPAQUE) 350 MG/ML injection    PRN GAdrian Prows MD   70 mL at 11/01/22 1453   lidocaine (PF) (XYLOCAINE) 1 % injection    PRN GAdrian Prows MD   2 mL at 11/01/22 1436   midazolam (VERSED) injection    PRN GAdrian Prows MD   2 mg at 11/01/22 1427   [MAR Hold] nitroGLYCERIN (NITROSTAT) SL tablet 0.4 mg   0.4 mg Sublingual Q5 Min x 3 PRN Tolia, Sunit, DO       [MAR Hold] ondansetron (ZOFRAN) injection 4 mg  4 mg Intravenous Q6H PRN Tolia, Sunit, DO       Radial Cocktail/Verapamil only    PRN GAdrian Prows MD   10 mL at 11/01/22 1436   sodium chloride 0.9 % bolus    Continuous PRN GAdrian Prows MD 500 mL/hr at 11/01/22 1435 500 mL/hr at 11/01/22 1435    Allergies as of 11/01/2022   (No Known Allergies)    Social History   Socioeconomic History   Marital status: Married    Spouse name: Not on file   Number of children: 2   Years of education: Not on file   Highest education level: Not on file  Occupational History   Not on file  Tobacco Use   Smoking status: Never   Smokeless tobacco: Never  Vaping Use   Vaping Use: Never used  Substance and Sexual Activity   Alcohol use: No   Drug use: No   Sexual activity: Not on file  Other Topics Concern   Not on file  Social History Narrative   Not on file   Social Determinants of Health   Financial Resource Strain: Not on file  Food Insecurity: Not on file  Transportation Needs: Not on file  Physical Activity: Not on file  Stress: Not on file  Social Connections: Not on file  Intimate Partner Violence: Not on file    Review of Systems: All systems reviewed and negative except where noted in HPI.  Physical Exam: Vital signs in last 24 hours: Temp:  [98.2 F (36.8 C)] 98.2 F (36.8 C) (11/02 1020) Pulse Rate:  [88-110] 91 (11/02 1200) Resp:  [13-19] 14 (11/02 1200) BP: (106-135)/(62-77) 106/62 (11/02 1200)  SpO2:  [96 %-99 %] 98 % (11/02 1413) Weight:  [83.9 kg] 83.9 kg (11/02 1023)    General:  Alert female in NAD Psych:  Pleasant, cooperative. Normal mood and affect Eyes: Pupils equal Ears:  Normal auditory acuity Nose: No deformity, discharge or lesions Neck:  Supple, no masses felt Lungs:  Clear to auscultation.  Heart:  Regular rate, regular rhythm. No lower extremity edema Abdomen:  Soft, nondistended, nontender,  active bowel sounds, no masses felt Rectal :  Deferred Msk: Symmetrical without gross deformities.  Neurologic:  Alert, oriented, grossly normal neurologically Skin:  Intact without significant lesions.    Intake/Output from previous day: No intake/output data recorded. Intake/Output this shift:  Total I/O In: 43.8 [I.V.:43.8] Out: -     Principal Problem:   NSTEMI (non-ST elevated myocardial infarction) ([place]) Active Problems:   DM (diabetes mellitus) ([place])   Essential hypertension, benign   Mixed hyperlipidemia    Tye Savoy, NP-C @  11/01/2022, 2:54 PM   Attending physician's note   I have taken history, reviewed the chart and examined the patient. I performed a substantive portion of this encounter, including complete performance of at least one of the key components, in conjunction with the APP. I agree with the Advanced Practitioner's note, impression and recommendations.   Melena with anemia Hb 8.5 (baseline 10). HD stable. Last EGD 09/2017 showed small HH, DU. Pt taking PPI on as needed basis. H/O ibuprofen use.  Adm with NSTEMI s/p cardiac cath showing 3V Dz, EF 30-35%. Not on AC.  H/O polyps. Nov 2018. Next elective colon due Nov 2025.  Fatty liver. No cirrhosis.  Plan: -IV protonix. -Ok to proceed with EGD per cardiology.  She would require PCI/CABG/anticoagulation in future.  Tentatively scheduled for tomorrow morning.  Explained risks and benefits. -Trend CBC. Keep Hb>8.   Carmell Austria, MD [facility] [phone]

## 2022-11-01 NOTE — ED Provider Notes (Signed)
[facility] EMERGENCY DEPARTMENT Provider Note  CSN: [id number] Arrival date & time: 11/01/22 1014  Chief Complaint(s) Chest Pain and Code STEMI  HPI Mercedes Adams is a 66 y.o. female with history of diabetes, tension, hyperlipidemia presenting to the emergency department chest pain.  Patient reports that yesterday she developed 5 out of 10 chest pain while lying down sitting.  The pain has been intermittent, persistent this morning so she went to urgent care, who obtained EKG and called EMS as it was abnormal.  Patient reports associated nausea, couple episodes of vomiting, no diaphoresis.  She also reports mild shortness of breath.  She received 324 mg aspirin per EMS with improvement in her pain, now reports no pain.  The pain does not radiate, she denies any headache, weakness.  She reports mild tingling in bilateral hands.  No numbness.  Pain is substernal.   Past Medical History Past Medical History:  Diagnosis Date   Anxiety    Arthritis    Diabetes mellitus without complication ([place])    Heart murmur    Hyperlipidemia    Hypertension    Thyroid disease    Patient Active Problem List   Diagnosis Date Noted   NSTEMI (non-ST elevated myocardial infarction) ([place]) 11/01/2022   DM (diabetes mellitus) ([place]) 11/01/2022   Essential hypertension, benign 11/01/2022   Mixed hyperlipidemia 11/01/2022   Home Medication(s) Prior to Admission medications   Medication Sig Start Date End Date Taking? Authorizing Provider  alendronate (FOSAMAX) 70 MG tablet Take 70 mg by mouth once a week. 07/27/22  Yes [provider]  alum & mag hydroxide-simeth (MAALOX PLUS) 400-400-40 MG/5ML suspension Take 10 mLs by mouth every 6 (six) hours as needed for indigestion.   Yes [provider]  folic acid (FOLVITE) 1 MG tablet Take 1 mg by mouth daily. 09/06/17  Yes [provider]  ibuprofen (ADVIL,MOTRIN) 200 MG tablet Take 200 mg by mouth every 6 (six) hours as  needed.   Yes [provider]  JARDIANCE 25 MG TABS tablet Take 25 mg by mouth daily.   Yes [provider]  levothyroxine (SYNTHROID, LEVOTHROID) 100 MCG tablet Take 100 mcg by mouth daily before breakfast.   Yes [provider]  lisinopril (ZESTRIL) 5 MG tablet Take 5 mg by mouth daily. 09/18/22  Yes [provider]  lisinopril-hydrochlorothiazide (PRINZIDE,ZESTORETIC) 10-12.5 MG tablet Take 1 tablet by mouth daily.   Yes [provider]  lovastatin (MEVACOR) 20 MG tablet Take 20 mg by mouth at bedtime.   Yes [provider]  metFORMIN (GLUCOPHAGE) 1000 MG tablet Take 2,000 mg by mouth in the morning and at bedtime.   Yes [provider]  Multiple Vitamin (MULTIVITAMIN) capsule Take 1 capsule by mouth daily.   Yes [provider]  omeprazole (PRILOSEC) 40 MG capsule Take 40 mg by mouth daily. 10/16/17  Yes [provider]  Methotrexate Sodium (METHOTREXATE, PF,) 50 MG/2ML injection Inject into the vein once a week.    [provider]  omeprazole (PRILOSEC) 40 MG capsule Take 1 capsule (40 mg total) by mouth daily. Pt to take omeprazole 40 mg BID for 1 month, then decrease to q am. Patient not taking: Reported on 11/01/2022 10/22/17   Armbruster, Carlota Raspberry, MD  Past Surgical History Past Surgical History:  Procedure Laterality Date   APPENDECTOMY     CESAREAN SECTION     x 2   REPLACEMENT TOTAL KNEE     left knee   Family History Family History  Problem Relation Age of Onset   Hypertension Mother    High Cholesterol Mother    Hypertension Father    High Cholesterol Father    Heart failure Father     Social History Social History   Tobacco Use   Smoking status: Never   Smokeless tobacco: Never  Vaping Use   Vaping Use: Never used  Substance Use Topics   Alcohol  use: No   Drug use: No   Allergies Patient has no known allergies.  Review of Systems Review of Systems  All other systems reviewed and are negative.   Physical Exam Vital Signs  I have reviewed the triage vital signs BP 135/77 (BP Location: Left Arm)   Pulse (!) 110   Temp 98.2 F (36.8 C) (Oral)   Resp 19   Ht '5\' 4"'$  (1.626 m)   Wt 83.9 kg   BMI 31.76 kg/m  Physical Exam Vitals and nursing note reviewed.  Constitutional:      General: She is not in acute distress.    Appearance: She is well-developed.  HENT:     Head: Normocephalic and atraumatic.     Mouth/Throat:     Mouth: Mucous membranes are moist.  Eyes:     Pupils: Pupils are equal, round, and reactive to light.  Cardiovascular:     Rate and Rhythm: Normal rate and regular rhythm.     Heart sounds: Murmur heard.  Pulmonary:     Effort: Pulmonary effort is normal. No respiratory distress.     Breath sounds: Normal breath sounds.  Abdominal:     General: Abdomen is flat.     Palpations: Abdomen is soft.     Tenderness: There is no abdominal tenderness.  Musculoskeletal:        General: No tenderness.     Right lower leg: Edema present.     Left lower leg: Edema present.     Comments: Mild bilateral peripheral edema  Skin:    General: Skin is warm and dry.  Neurological:     General: No focal deficit present.     Mental Status: She is alert. Mental status is at baseline.  Psychiatric:        Mood and Affect: Mood normal.        Behavior: Behavior normal.     ED Results and Treatments Labs (all labs ordered are listed, but only abnormal results are displayed) Labs Reviewed  BASIC METABOLIC PANEL - Abnormal; Notable for the following components:      Result Value   CO2 21 (*)    Glucose, Bld 266 (*)    All other components within normal limits  CBC - Abnormal; Notable for the following components:   WBC 11.9 (*)    RBC 2.94 (*)    Hemoglobin 8.5 (*)    HCT 26.9 (*)    All other components  within normal limits  HEMOGLOBIN A1C - Abnormal; Notable for the following components:   Hgb A1c MFr Bld 8.0 (*)    All other components within normal limits  TROPONIN I (HIGH SENSITIVITY) - Abnormal; Notable for the following components:   Troponin I (High Sensitivity) 2,065 (*)    All other components within normal limits  TSH  BRAIN NATRIURETIC PEPTIDE  HIV ANTIBODY (ROUTINE TESTING W REFLEX)                                                                                                                          Radiology No results found.  Pertinent labs & imaging results that were available during my care of the patient were reviewed by me and considered in my medical decision making (see MDM for details).  Medications Ordered in ED Medications  aspirin EC tablet 81 mg (has no administration in time range)  nitroGLYCERIN (NITROSTAT) SL tablet 0.4 mg (has no administration in time range)  acetaminophen (TYLENOL) tablet 650 mg (has no administration in time range)  ondansetron (ZOFRAN) injection 4 mg (has no administration in time range)  atorvastatin (LIPITOR) tablet 80 mg (has no administration in time range)  heparin ADULT infusion 100 units/mL (25000 units/240m) (900 Units/hr Intravenous New Bag/Given 11/01/22 1100)  heparin injection 4,000 Units (4,000 Units Intravenous Given 11/01/22 1028)  tirofiban (AGGRASTAT) bolus via infusion 2,100 mcg (2,100 mcg Intravenous Bolus from Bag 11/01/22 1103)                                                                                                                                     Procedures .Critical Care  Performed by: SCristie Hem MD Authorized by: SCristie Hem MD   Critical care provider statement:    Critical care time (minutes):  30   Critical care time was exclusive of:  Separately billable procedures and treating other patients   Critical care was necessary to treat or prevent imminent or life-threatening  deterioration of the following conditions:  Circulatory failure and cardiac failure   Critical care was time spent personally by me on the following activities:  Development of treatment plan with patient or surrogate, discussions with consultants, evaluation of patient's response to treatment, examination of patient, ordering and review of laboratory studies, ordering and review of radiographic studies, ordering and performing treatments and interventions, pulse oximetry, re-evaluation of patient's condition and review of old charts   Care discussed with: admitting provider     (including critical care time)  Medical Decision Making / ED Course   MDM:  66year old female presenting to the emergency department with chest pain.  EKG notable for diffuse ST depression with elevation in aVR, concerning for ischemia.  Discussed with Dr. TTerri Skainswho feels that  patient should receive very urgent cardiac catheterization for further work-up but does not quite meet criteria for STEMI.  Cardiology evaluated patient on arrival.  Low concern for dissection, no pain rating to the back, no severe acute onset pain.  Patient currently pain-free.  Patient will be started on heparin drip.  Dr. Terri Skains reports he will admit the patient to his service.  Also obtain x-ray, labs including troponin, BNP  Clinical Course as of 11/01/22 1129  Thu Nov 01, 2022  1026 Cardiology at bedside. Consented patient for catheterization.  [WS]    Clinical Course User Index [WS] Cristie Hem, MD     Additional history obtained: -Additional history obtained from ems -External records from outside source obtained and reviewed including: Chart review including previous notes, labs, imaging, consultation notes including rheumatology note 9/23 for leg swelling   Lab Tests: -I ordered, reviewed, and interpreted labs.   The pertinent results include:   Labs Reviewed  BASIC METABOLIC PANEL - Abnormal; Notable for the  following components:      Result Value   CO2 21 (*)    Glucose, Bld 266 (*)    All other components within normal limits  CBC - Abnormal; Notable for the following components:   WBC 11.9 (*)    RBC 2.94 (*)    Hemoglobin 8.5 (*)    HCT 26.9 (*)    All other components within normal limits  HEMOGLOBIN A1C - Abnormal; Notable for the following components:   Hgb A1c MFr Bld 8.0 (*)    All other components within normal limits  TROPONIN I (HIGH SENSITIVITY) - Abnormal; Notable for the following components:   Troponin I (High Sensitivity) 2,065 (*)    All other components within normal limits  TSH  BRAIN NATRIURETIC PEPTIDE  HIV ANTIBODY (ROUTINE TESTING W REFLEX)    Notable for significantly elevated troponin, blood glucose. Mild leukocytosis  EKG   EKG Interpretation  Date/Time:  Thursday November 01 2022 10:18:00 EDT Ventricular Rate:  110 PR Interval:  127 QRS Duration: 94 QT Interval:  312 QTC Calculation: 422 R Axis:   72 Text Interpretation: Sinus tachycardia Repol abnrm, severe global ischemia (LM/MVD) Confirmed by Garnette Gunner [id number]) on 11/01/2022 10:32:33 AM         Imaging Studies ordered: I ordered imaging studies including CXR On my interpretation imaging demonstrates clear lungs I independently visualized and interpreted imaging.   Medicines ordered and prescription drug management: Meds ordered this encounter  Medications   heparin injection 4,000 Units   aspirin EC tablet 81 mg   nitroGLYCERIN (NITROSTAT) SL tablet 0.4 mg   acetaminophen (TYLENOL) tablet 650 mg   ondansetron (ZOFRAN) injection 4 mg   atorvastatin (LIPITOR) tablet 80 mg   heparin ADULT infusion 100 units/mL (25000 units/213m)   tirofiban (AGGRASTAT) bolus via infusion 2,100 mcg   DISCONTD: tirofiban (AGGRASTAT) infusion 50 mcg/mL 100 mL    -I have reviewed the patients home medicines and have made adjustments as needed   Consultations Obtained: I requested consultation  with the cardiologist,  and discussed lab and imaging findings as well as pertinent plan - they recommend: admit to cardiology and [place]    Cardiac Monitoring: The patient was maintained on a cardiac monitor.  I personally viewed and interpreted the cardiac monitored which showed an underlying rhythm of: sinus tachycardia  Social Determinants of Health:  Diagnosis or treatment significantly limited by social determinants of health: obesity   Reevaluation: After the interventions noted above,  I reevaluated the patient and found that they have improved  Co morbidities that complicate the patient evaluation  Past Medical History:  Diagnosis Date   Anxiety    Arthritis    Diabetes mellitus without complication ([place])    Heart murmur    Hyperlipidemia    Hypertension    Thyroid disease       Dispostion: Disposition decision including need for hospitalization was considered, and patient admitted to the hospital.    Final Clinical Impression(s) / ED Diagnoses Final diagnoses:  NSTEMI (non-ST elevated myocardial infarction) [facility])     This chart was dictated using voice recognition software.  Despite best efforts to proofread,  errors can occur which can change the documentation meaning.    Cristie Hem, MD 11/01/22 [phone]

## 2022-11-01 NOTE — ED Triage Notes (Signed)
Pt BIBGEMS with cp that started yesterday while line dancing. Pt said it was sternal cp that did not radiate anywhere. Intermittently got better, on way to swim aerobic today cp increased, developed dizziness, and SOB. Went to urgent care, UC sent to ED for ST changes. Pt is alert speaking in full sentences, NAD.  ASA 324 provided by EMS en route.  132/84 100 hr 98 ra CBG 388

## 2022-11-01 NOTE — Progress Notes (Signed)
  Transition of Care [facility]) Screening Note   Patient Details  Name: Mercedes Adams Date of Birth: 06-19-1956   Transition of Care [facility]) CM/SW Contact:    Milas Gain, Blaine Phone Number: 11/01/2022, 4:09 PM    Transition of Care Department [facility]) has reviewed patient and no TOC needs have been identified at this time. We will continue to monitor patient advancement through interdisciplinary progression rounds. If new patient transition needs arise, please place a TOC consult.

## 2022-11-02 ENCOUNTER — Encounter (HOSPITAL_COMMUNITY)
Admission: EM | Disposition: A | Discharge status: Home / Self Care | Payer: Self-pay | Source: Home / Self Care | Attending: Cardiology

## 2022-11-02 ENCOUNTER — Inpatient Hospital Stay (HOSPITAL_COMMUNITY): Payer: Medicare Other | Admitting: Certified Registered Nurse Anesthetist

## 2022-11-02 ENCOUNTER — Inpatient Hospital Stay (HOSPITAL_COMMUNITY): Payer: Medicare Other

## 2022-11-02 ENCOUNTER — Encounter (HOSPITAL_COMMUNITY): Payer: Self-pay | Admitting: Cardiology

## 2022-11-02 DIAGNOSIS — I10 Essential (primary) hypertension: Secondary | ICD-10-CM

## 2022-11-02 DIAGNOSIS — K264 Chronic or unspecified duodenal ulcer with hemorrhage: Secondary | ICD-10-CM

## 2022-11-02 DIAGNOSIS — I5031 Acute diastolic (congestive) heart failure: Secondary | ICD-10-CM

## 2022-11-02 DIAGNOSIS — D62 Acute posthemorrhagic anemia: Secondary | ICD-10-CM

## 2022-11-02 DIAGNOSIS — K269 Duodenal ulcer, unspecified as acute or chronic, without hemorrhage or perforation: Secondary | ICD-10-CM

## 2022-11-02 DIAGNOSIS — E6609 Other obesity due to excess calories: Secondary | ICD-10-CM

## 2022-11-02 DIAGNOSIS — K922 Gastrointestinal hemorrhage, unspecified: Secondary | ICD-10-CM

## 2022-11-02 DIAGNOSIS — I252 Old myocardial infarction: Secondary | ICD-10-CM

## 2022-11-02 DIAGNOSIS — K449 Diaphragmatic hernia without obstruction or gangrene: Secondary | ICD-10-CM

## 2022-11-02 DIAGNOSIS — K2971 Gastritis, unspecified, with bleeding: Secondary | ICD-10-CM

## 2022-11-02 DIAGNOSIS — K2970 Gastritis, unspecified, without bleeding: Secondary | ICD-10-CM

## 2022-11-02 DIAGNOSIS — E66811 Obesity, class 1: Secondary | ICD-10-CM

## 2022-11-02 DIAGNOSIS — I509 Heart failure, unspecified: Secondary | ICD-10-CM

## 2022-11-02 DIAGNOSIS — I2510 Atherosclerotic heart disease of native coronary artery without angina pectoris: Secondary | ICD-10-CM

## 2022-11-02 HISTORY — PX: SUBMUCOSAL INJECTION: SHX5543

## 2022-11-02 HISTORY — DX: Acute posthemorrhagic anemia: D62

## 2022-11-02 HISTORY — PX: ESOPHAGOGASTRODUODENOSCOPY (EGD) WITH PROPOFOL: SHX5813

## 2022-11-02 HISTORY — DX: Gastrointestinal hemorrhage, unspecified: K92.2

## 2022-11-02 HISTORY — PX: BIOPSY: SHX5522

## 2022-11-02 HISTORY — DX: Acute diastolic (congestive) heart failure: I50.31

## 2022-11-02 HISTORY — DX: Obesity, class 1: E66.811

## 2022-11-02 HISTORY — PX: HEMOSTASIS CLIP PLACEMENT: SHX6857

## 2022-11-02 HISTORY — DX: Duodenal ulcer, unspecified as acute or chronic, without hemorrhage or perforation: K26.9

## 2022-11-02 HISTORY — DX: Body mass index (BMI) 32.0-32.9, adult: E66.09

## 2022-11-02 HISTORY — PX: HEMOSTASIS CONTROL: SHX6838

## 2022-11-02 LAB — GLUCOSE, CAPILLARY
Glucose-Capillary: 102 mg/dL — ABNORMAL HIGH (ref 70–99)
Glucose-Capillary: 107 mg/dL — ABNORMAL HIGH (ref 70–99)
Glucose-Capillary: 112 mg/dL — ABNORMAL HIGH (ref 70–99)
Glucose-Capillary: 120 mg/dL — ABNORMAL HIGH (ref 70–99)
Glucose-Capillary: 129 mg/dL — ABNORMAL HIGH (ref 70–99)
Glucose-Capillary: 129 mg/dL — ABNORMAL HIGH (ref 70–99)
Glucose-Capillary: 130 mg/dL — ABNORMAL HIGH (ref 70–99)

## 2022-11-02 LAB — LIPID PANEL
Cholesterol: 137 mg/dL (ref 0–200)
HDL: 31 mg/dL — ABNORMAL LOW (ref >40)
LDL Cholesterol: 70 mg/dL (ref 0–99)
Total CHOL/HDL Ratio: 4.4 RATIO
Triglycerides: 179 mg/dL — ABNORMAL HIGH (ref <150)
VLDL: 36 mg/dL (ref 0–40)

## 2022-11-02 LAB — CBC WITH DIFFERENTIAL/PLATELET
Abs Immature Granulocytes: 0.05 10*3/uL (ref 0.00–0.07)
Basophils Absolute: 0.1 10*3/uL (ref 0.0–0.1)
Basophils Relative: 1 %
Eosinophils Absolute: 0.2 10*3/uL (ref 0.0–0.5)
Eosinophils Relative: 2 %
HCT: 26.7 % — ABNORMAL LOW (ref 36.0–46.0)
Hemoglobin: 9.1 g/dL — ABNORMAL LOW (ref 12.0–15.0)
Immature Granulocytes: 0 %
Lymphocytes Relative: 18 %
Lymphs Abs: 2 10*3/uL (ref 0.7–4.0)
MCH: 29.8 pg (ref 26.0–34.0)
MCHC: 34.1 g/dL (ref 30.0–36.0)
MCV: 87.5 fL (ref 80.0–100.0)
Monocytes Absolute: 0.7 10*3/uL (ref 0.1–1.0)
Monocytes Relative: 6 %
Neutro Abs: 8.4 10*3/uL — ABNORMAL HIGH (ref 1.7–7.7)
Neutrophils Relative %: 73 %
Platelets: 224 10*3/uL (ref 150–400)
RBC: 3.05 MIL/uL — ABNORMAL LOW (ref 3.87–5.11)
RDW: 14.9 % (ref 11.5–15.5)
WBC: 11.5 10*3/uL — ABNORMAL HIGH (ref 4.0–10.5)
nRBC: 0.2 % (ref 0.0–0.2)

## 2022-11-02 LAB — ECHOCARDIOGRAM COMPLETE
AR max vel: 2.1 cm2
AV Area VTI: 2.24 cm2
AV Area mean vel: 2.27 cm2
AV Mean grad: 5 mmHg
AV Peak grad: 9.6 mmHg
Ao pk vel: 1.55 m/s
Area-P 1/2: 3.61 cm2
Calc EF: 54.7 %
Height: 64 in
MV M vel: 4.57 m/s
MV Peak grad: 83.7 mmHg
S' Lateral: 2.9 cm
Single Plane A2C EF: 43.6 %
Single Plane A4C EF: 62.3 %
Weight: 3022.95 oz

## 2022-11-02 LAB — COMPREHENSIVE METABOLIC PANEL
ALT: 17 U/L (ref 0–44)
AST: 26 U/L (ref 15–41)
Albumin: 2.7 g/dL — ABNORMAL LOW (ref 3.5–5.0)
Alkaline Phosphatase: 53 U/L (ref 38–126)
Anion gap: 7 (ref 5–15)
BUN: 16 mg/dL (ref 8–23)
CO2: 22 mmol/L (ref 22–32)
Calcium: 7.9 mg/dL — ABNORMAL LOW (ref 8.9–10.3)
Chloride: 106 mmol/L (ref 98–111)
Creatinine, Ser: 0.77 mg/dL (ref 0.44–1.00)
GFR, Estimated: >60 mL/min (ref >60)
Glucose, Bld: 118 mg/dL — ABNORMAL HIGH (ref 70–99)
Potassium: 3.8 mmol/L (ref 3.5–5.1)
Sodium: 135 mmol/L (ref 135–145)
Total Bilirubin: 1.7 mg/dL — ABNORMAL HIGH (ref 0.3–1.2)
Total Protein: 5.5 g/dL — ABNORMAL LOW (ref 6.5–8.1)

## 2022-11-02 LAB — HEMOGLOBIN AND HEMATOCRIT, BLOOD
HCT: 25.3 % — ABNORMAL LOW (ref 36.0–46.0)
Hemoglobin: 8.5 g/dL — ABNORMAL LOW (ref 12.0–15.0)

## 2022-11-02 LAB — PROTIME-INR
INR: 1.1 (ref 0.8–1.2)
Prothrombin Time: 13.8 seconds (ref 11.4–15.2)

## 2022-11-02 LAB — LDL CHOLESTEROL, DIRECT: Direct LDL: 79 mg/dL (ref 0–99)

## 2022-11-02 SURGERY — ESOPHAGOGASTRODUODENOSCOPY (EGD) WITH PROPOFOL
Anesthesia: Monitor Anesthesia Care

## 2022-11-02 MED ORDER — PERFLUTREN LIPID MICROSPHERE
1.0000 mL | INTRAVENOUS | Status: AC | PRN
  Administered 2022-11-02: 2 mL via INTRAVENOUS

## 2022-11-02 MED ORDER — DOCUSATE SODIUM 100 MG PO CAPS
100.0000 mg | ORAL_CAPSULE | Freq: Two times a day (BID) | ORAL | Status: DC
  Administered 2022-11-02 – 2022-11-05 (×7): 100 mg via ORAL
  Filled 2022-11-02 (×6): qty 1

## 2022-11-02 MED ORDER — EPHEDRINE SULFATE-NACL 50-0.9 MG/10ML-% IV SOSY
PREFILLED_SYRINGE | INTRAVENOUS | Status: DC | PRN
  Administered 2022-11-02: 5 mg via INTRAVENOUS

## 2022-11-02 MED ORDER — SODIUM CHLORIDE (PF) 0.9 % IJ SOLN
PREFILLED_SYRINGE | INTRAMUSCULAR | Status: DC | PRN
  Administered 2022-11-02: 3 mL

## 2022-11-02 MED ORDER — PANTOPRAZOLE 80MG IVPB - SIMPLE MED
80.0000 mg | Freq: Once | INTRAVENOUS | Status: DC
  Filled 2022-11-02: qty 100

## 2022-11-02 MED ORDER — EPINEPHRINE 1 MG/10ML IJ SOSY
PREFILLED_SYRINGE | INTRAMUSCULAR | Status: AC
  Filled 2022-11-02: qty 10

## 2022-11-02 MED ORDER — SENNA 8.6 MG PO TABS
1.0000 | ORAL_TABLET | Freq: Every day | ORAL | Status: DC
  Administered 2022-11-02: 8.6 mg via ORAL
  Filled 2022-11-02 (×3): qty 1

## 2022-11-02 MED ORDER — ONDANSETRON HCL 4 MG/2ML IJ SOLN
4.0000 mg | Freq: Once | INTRAMUSCULAR | Status: AC
  Administered 2022-11-02: 4 mg via INTRAVENOUS

## 2022-11-02 MED ORDER — METOPROLOL TARTRATE 25 MG PO TABS
25.0000 mg | ORAL_TABLET | Freq: Three times a day (TID) | ORAL | Status: DC
  Administered 2022-11-02 – 2022-11-03 (×2): 25 mg via ORAL
  Filled 2022-11-02 (×4): qty 1

## 2022-11-02 MED ORDER — PROPOFOL 10 MG/ML IV BOLUS
INTRAVENOUS | Status: DC | PRN
  Administered 2022-11-02: 30 mg via INTRAVENOUS
  Administered 2022-11-02: 70 mg via INTRAVENOUS
  Administered 2022-11-02 (×2): 30 mg via INTRAVENOUS
  Administered 2022-11-02: 20 mg via INTRAVENOUS
  Administered 2022-11-02: 40 mg via INTRAVENOUS
  Administered 2022-11-02: 20 mg via INTRAVENOUS

## 2022-11-02 MED ORDER — CHLORHEXIDINE GLUCONATE CLOTH 2 % EX PADS
6.0000 | MEDICATED_PAD | Freq: Every day | CUTANEOUS | Status: DC
  Administered 2022-11-03 – 2022-11-05 (×3): 6 via TOPICAL

## 2022-11-02 MED ORDER — PHENYLEPHRINE 80 MCG/ML (10ML) SYRINGE FOR IV PUSH (FOR BLOOD PRESSURE SUPPORT)
PREFILLED_SYRINGE | INTRAVENOUS | Status: DC | PRN
  Administered 2022-11-02 (×4): 160 ug via INTRAVENOUS

## 2022-11-02 MED ORDER — FUROSEMIDE 10 MG/ML IJ SOLN
10.0000 mg | Freq: Once | INTRAMUSCULAR | Status: DC
  Filled 2022-11-02: qty 2

## 2022-11-02 MED ORDER — ALBUMIN HUMAN 5 % IV SOLN: INTRAVENOUS | Status: DC | PRN

## 2022-11-02 MED ORDER — PANTOPRAZOLE INFUSION (NEW) - SIMPLE MED
8.0000 mg/h | INTRAVENOUS | Status: AC
  Administered 2022-11-02 – 2022-11-03 (×3): 8 mg/h via INTRAVENOUS
  Filled 2022-11-02 (×5): qty 100

## 2022-11-02 MED ORDER — ONDANSETRON HCL 4 MG/2ML IJ SOLN
INTRAMUSCULAR | Status: AC
  Filled 2022-11-02: qty 2

## 2022-11-02 SURGICAL SUPPLY — 15 items

## 2022-11-02 NOTE — Consult Note (Cosign Needed)
[address]       [place] [address]             [phone]        Mercedes Adams  Medical Record #[id number] Date of Birth: 01/16/1956  Referring: Terri Skains Primary Care: Isaias Sakai, DO Primary Cardiologist:None  Chief Complaint:    Chief Complaint  Patient presents with   Chest Pain   Code STEMI    History of Present Illness:      Mercedes Adams is a 66 yo female with history of HTN, HLD, Hypothyroidism, DM Type 2, and Obesity.  She presented to the ED with complaints of chest pain on 11/01/2022.  The patient developed chest pain while line dancing the evening of 10/31/2022.  The symptoms didn't last long, however they did occur throughout the day.  She also experienced dizziness, weakness, palpitations, and pressure.  She presented to urgent care for evaluation but was referred to the ED due to her EKG.  She was treated with ASA en route to the ED.  Workup in the ED showed positive Troponin levels.  EKG was free from ST changes.  She was ruled in for NSTEMI.  She underwent cardiac catheterization by Dr. Einar Gip and was found to have reduced EF and multivessel CAD.  It was felt coronary bypass grafting would be indicated.  The patient was noted to be anemic on CBC.  GI consult was obtained and patient underwent EGD workup which consisted of EGD.  She was noted to have a duodenal ulcer that required clipping.  She was also noted to have a small hiatal hernia.  Cardiothoracic surgery consutltation has been requested.  The patient states is currently chest pain free.  She denies smoking history.  She states she is active attending her local [employer] several days per week.  She participates in water aerobics and line dancing.  Positive family history of CAD with her father passing away at 68 from massive heart attack.  The patient admitted she was experiencing a decline in her exercise tolerance lately.  The patient is willing to have surgery.  She is nervous  about surgery.  Current Activity/ Functional Status: Patient is independent with mobility/ambulation, transfers, ADL's, IADL's.   Zubrod Score: At the time of surgery this patient's most appropriate activity status/level should be described as: '[]'$     0    Normal activity, no symptoms '[x]'$     1    Restricted in physical strenuous activity but ambulatory, able to do out light work '[]'$     2    Ambulatory and capable of self care, unable to do work activities, up and about                 more than 50%  Of the time                            '[]'$     3    Only limited self care, in bed greater than 50% of waking hours '[]'$     4    Completely disabled, no self care, confined to bed or chair '[]'$     5    Moribund  Past Medical History:  Diagnosis Date   Anxiety    Arthritis    Diabetes mellitus without complication ([place])    Heart murmur    Hyperlipidemia    Hypertension    Thyroid disease  Past Surgical History:  Procedure Laterality Date   APPENDECTOMY     CESAREAN SECTION     x 2   LEFT HEART CATH AND CORONARY ANGIOGRAPHY N/A 11/01/2022   Procedure: LEFT HEART CATH AND CORONARY ANGIOGRAPHY;  Surgeon: Adrian Prows, MD;  Location: [facility];  Service: Cardiovascular;  Laterality: N/A;   REPLACEMENT TOTAL KNEE     left knee    Social History   Tobacco Use  Smoking Status Never  Smokeless Tobacco Never    Social History   Substance and Sexual Activity  Alcohol Use No     No Known Allergies  Current Facility-Administered Medications  Medication Dose Route Frequency Provider Last Rate Last Admin   0.9 %  sodium chloride infusion (Manually program via Guardrails IV Fluids)   Intravenous Once Tolia, Sunit, DO       0.9 %  sodium chloride infusion  500 mL Intravenous Continuous Tolia, Sunit, DO 10 mL/hr at 11/02/22 1100 Restarted at 11/02/22 1117   0.9 %  sodium chloride infusion  250 mL Intravenous PRN Adrian Prows, MD       acetaminophen (TYLENOL) tablet 650 mg  650 mg Oral  Q4H PRN Adrian Prows, MD   650 mg at 11/01/22 2017   alum & mag hydroxide-simeth (MAALOX/MYLANTA) 200-200-20 MG/5ML suspension 10 mL  10 mL Oral Q6H PRN Tolia, Sunit, DO       aspirin EC tablet 81 mg  81 mg Oral Daily Adrian Prows, MD   81 mg at 11/02/22 1325   atorvastatin (LIPITOR) tablet 80 mg  80 mg Oral QHS Wilson Singer I, RPH   80 mg at 11/01/22 2018   Chlorhexidine Gluconate Cloth 2 % PADS 6 each  6 each Topical Daily Tolia, Sunit, DO       empagliflozin (JARDIANCE) tablet 25 mg  25 mg Oral Daily Tolia, Sunit, DO       folic acid (FOLVITE) tablet 1 mg  1 mg Oral Daily Tolia, Sunit, DO   1 mg at 11/02/22 1324   furosemide (LASIX) injection 10 mg  10 mg Intravenous Once Tolia, Sunit, DO       heparin injection 5,000 Units  5,000 Units Subcutaneous Q8H Adrian Prows, MD   5,000 Units at 11/02/22 1326   insulin aspart (novoLOG) injection 0-15 Units  0-15 Units Subcutaneous Q4H Adrian Prows, MD   2 Units at 11/02/22 0927   isosorbide mononitrate (IMDUR) 24 hr tablet 30 mg  30 mg Oral Daily Adrian Prows, MD   30 mg at 11/02/22 1325   levothyroxine (SYNTHROID) tablet 100 mcg  100 mcg Oral Q0600 Tolia, Sunit, DO   100 mcg at 11/02/22 1478   metoprolol tartrate (LOPRESSOR) tablet 25 mg  25 mg Oral BID Adrian Prows, MD       multivitamin with minerals tablet 1 tablet  1 tablet Oral Daily Tolia, Sunit, DO   1 tablet at 11/02/22 1326   nitroGLYCERIN (NITROSTAT) SL tablet 0.4 mg  0.4 mg Sublingual Q5 Min x 3 PRN Adrian Prows, MD       ondansetron [facility]) injection 4 mg  4 mg Intravenous Q6H PRN Adrian Prows, MD       [START ON 11/05/2022] pantoprazole (PROTONIX) injection 40 mg  40 mg Intravenous Q12H Willia Craze, NP       pantoprozole (PROTONIX) 80 mg /NS 100 mL infusion  8 mg/hr Intravenous Continuous Jackquline Denmark, MD 10 mL/hr at 11/02/22 1312 8 mg/hr at 11/02/22 1312  sodium chloride flush (NS) 0.9 % injection 3 mL  3 mL Intravenous Q12H Adrian Prows, MD       sodium chloride flush (NS) 0.9 % injection 3  mL  3 mL Intravenous PRN Adrian Prows, MD        Facility-Administered Medications Prior to Admission  Medication Dose Route Frequency Provider Last Rate Last Admin   0.9 %  sodium chloride infusion  500 mL Intravenous Continuous Ladene Artist, MD       Medications Prior to Admission  Medication Sig Dispense Refill Last Dose   alendronate (FOSAMAX) 70 MG tablet Take 70 mg by mouth once a week.   Past Week   alum & mag hydroxide-simeth (MAALOX PLUS) 400-400-40 MG/5ML suspension Take 10 mLs by mouth every 6 (six) hours as needed for indigestion.   40/08/1447   folic acid (FOLVITE) 1 MG tablet Take 1 mg by mouth daily.  0 11/01/2022   ibuprofen (ADVIL,MOTRIN) 200 MG tablet Take 200 mg by mouth every 6 (six) hours as needed.   Past Week   JARDIANCE 25 MG TABS tablet Take 25 mg by mouth daily.   11/01/2022   levothyroxine (SYNTHROID, LEVOTHROID) 100 MCG tablet Take 100 mcg by mouth daily before breakfast.   11/01/2022   lisinopril (ZESTRIL) 5 MG tablet Take 5 mg by mouth daily.   11/01/2022   lisinopril-hydrochlorothiazide (PRINZIDE,ZESTORETIC) 10-12.5 MG tablet Take 1 tablet by mouth daily.   11/01/2022   lovastatin (MEVACOR) 20 MG tablet Take 20 mg by mouth at bedtime.   10/31/2022   metFORMIN (GLUCOPHAGE) 1000 MG tablet Take 2,000 mg by mouth in the morning and at bedtime.   11/01/2022   Multiple Vitamin (MULTIVITAMIN) capsule Take 1 capsule by mouth daily.   Past Week   omeprazole (PRILOSEC) 40 MG capsule Take 40 mg by mouth daily.  0 Past Week   Methotrexate Sodium (METHOTREXATE, PF,) 50 MG/2ML injection Inject into the vein once a week.   10/21/2022   omeprazole (PRILOSEC) 40 MG capsule Take 1 capsule (40 mg total) by mouth daily. Pt to take omeprazole 40 mg BID for 1 month, then decrease to q am. (Patient not taking: Reported on 11/01/2022) 120 capsule 1 Not Taking    Family History  Problem Relation Age of Onset   Hypertension Mother    High Cholesterol Mother    Hypertension Father    High  Cholesterol Father    Heart failure Father    Review of Systems:      Cardiac Review of Systems: Y or  [    ]= no  Chest Pain [ Y, resolved   ]  Resting SOB [   ] Exertional SOB  [Y  ]  Orthopnea [  ]   Pedal Edema [   ]    Palpitations [ Y ] Syncope  [  ]   Presyncope [   ]  General Review of Systems: [Y] = yes [  ]=no Constitional: recent weight change [  ]; anorexia [  ]; fatigue [Y  ]; nausea [Y  ]; night sweats [  ]; fever [  ]; or chills [  ]  Dental: Last Dentist visit: long time ago  Eye : blurred vision [  ]; diplopia [   ]; vision changes [  ];  Amaurosis fugax[  ]; Resp: cough [  ];  wheezing[  ];  hemoptysis[  ]; shortness of breath[  ]; paroxysmal nocturnal dyspnea[  ]; dyspnea on exertion[ Y ]; or orthopnea[  ];  GI:  gallstones[  ], vomiting[  ];  dysphagia[  ]; melena[  ];  hematochezia [  ]; heartburn[  ];   Hx of  Colonoscopy[  ]; GU: kidney stones [  ]; hematuria[  ];   dysuria [  ];  nocturia[  ];  history of     obstruction [  ]; urinary frequency [  ]             Skin: rash, swelling[Y, mainly right leg  ];, hair loss[  ];  peripheral edema[  ];  or itching[  ]; Musculosketetal: myalgias[  ];  joint swelling[  ];  joint erythema[  ];  joint pain[  ];  back pain[  ];  Heme/Lymph: bruising[  ];  bleeding[  ];  anemia[  ];  Neuro: TIA[  ];  headaches[  ];  stroke[ N ];  vertigo[  ];  seizures[  ];   paresthesias[  ];  difficulty walking[  ];  Psych:depression[  ]; anxiety[  ];  Endocrine: diabetes[Y  ];  thyroid dysfunction[ Y ];             Physical Exam: BP (!) 113/59 (BP Location: Left Arm)   Pulse 93   Temp 98.5 F (36.9 C)   Resp 16   Ht '5\' 4"'$  (1.626 m)   Wt 85.7 kg   SpO2 94%   BMI 32.43 kg/m    General appearance: alert, cooperative, and no distress Head: Normocephalic, without obvious abnormality, atraumatic Neck: no adenopathy, no carotid bruit, no JVD, supple, symmetrical, trachea midline,  and thyroid not enlarged, symmetric, no tenderness/mass/nodules Resp: clear to auscultation bilaterally Cardio: regular rate and rhythm GI: soft, non-tender; bowel sounds normal; no masses,  no organomegaly Extremities: extremities normal, atraumatic, no cyanosis or edema Neurologic: Grossly normal  Diagnostic Studies & Laboratory data:     Recent Radiology Findings:   ECHOCARDIOGRAM COMPLETE  Result Date: 11/02/2022    ECHOCARDIOGRAM REPORT   Patient Name:   Mercedes Adams Date of Exam: 11/02/2022 Medical Rec #:  [id number]    Height:       64.0 in Accession #:    [id number]   Weight:       188.9 lb Date of Birth:  12-Jan-1956    BSA:          1.910 m Patient Age:    21 years     BP:           105/55 mmHg Patient Gender: F            HR:           86 bpm. Exam Location:  Inpatient Procedure: 2D Echo and Intracardiac Opacification Agent Indications:    NSTEMI  History:        Patient has no prior history of Echocardiogram examinations.                 CAD; Risk Factors:Hypertension, Diabetes and Dyslipidemia.  Sonographer:    Harvie Junior Referring Phys: [id number] Adrian Prows  Sonographer Comments: Technically difficult study due to poor echo windows. IMPRESSIONS  1. Definity was  used for evaluation of wallmotion and EF estimation. Left ventricular ejection fraction, by estimation, is 50 to 55%. Left ventricular ejection fraction by 2D MOD biplane is 54.7 %. The left ventricle has low normal function. The left ventricle demonstrates regional wall motion abnormalities (see scoring diagram/findings for description). There is mild left ventricular hypertrophy. Left ventricular diastolic parameters are consistent with Grade II diastolic dysfunction (pseudonormalization). Elevated left ventricular end-diastolic pressure. There is mild hypokinesis of the left ventricular, entire inferior wall.  2. Right ventricular systolic function is normal. The right ventricular size is normal. There is normal pulmonary artery systolic  pressure. The estimated right ventricular systolic pressure is 25.4 mmHg.  3. Left atrial size was mildly dilated.  4. The mitral valve is normal in structure. Mild to moderate mitral valve regurgitation.  5. The aortic valve was not well visualized. Aortic valve regurgitation is not visualized. Aortic valve sclerosis/calcification is present, without any evidence of aortic stenosis.  6. The inferior vena cava is normal in size with greater than 50% respiratory variability, suggesting right atrial pressure of 3 mmHg. FINDINGS  Left Ventricle: Definity was used for evaluation of wallmotion and EF estimation. Left ventricular ejection fraction, by estimation, is 50 to 55%. Left ventricular ejection fraction by 2D MOD biplane is 54.7 %. The left ventricle has low normal function. The left ventricle demonstrates regional wall motion abnormalities. Mild hypokinesis of the left ventricular, entire inferior wall. Definity contrast agent was given IV to delineate the left ventricular endocardial borders. The left ventricular  internal cavity size was small. There is mild left ventricular hypertrophy. Left ventricular diastolic parameters are consistent with Grade II diastolic dysfunction (pseudonormalization). Elevated left ventricular end-diastolic pressure. Right Ventricle: The right ventricular size is normal. No increase in right ventricular wall thickness. Right ventricular systolic function is normal. There is normal pulmonary artery systolic pressure. The tricuspid regurgitant velocity is 2.80 m/s, and  with an assumed right atrial pressure of 3 mmHg, the estimated right ventricular systolic pressure is 27.0 mmHg. Left Atrium: Left atrial size was mildly dilated. Right Atrium: Right atrial size was normal in size. Pericardium: There is no evidence of pericardial effusion. Mitral Valve: The mitral valve is normal in structure. Mild to moderate mitral valve regurgitation. Tricuspid Valve: The tricuspid valve is normal  in structure. Tricuspid valve regurgitation is mild. Aortic Valve: The aortic valve was not well visualized. Aortic valve regurgitation is not visualized. Aortic valve sclerosis/calcification is present, without any evidence of aortic stenosis. Aortic valve mean gradient measures 5.0 mmHg. Aortic valve peak gradient measures 9.6 mmHg. Aortic valve area, by VTI measures 2.24 cm. Pulmonic Valve: The pulmonic valve was normal in structure. Pulmonic valve regurgitation is not visualized. No evidence of pulmonic stenosis. Aorta: The aortic root is normal in size and structure. Venous: The inferior vena cava is normal in size with greater than 50% respiratory variability, suggesting right atrial pressure of 3 mmHg. IAS/Shunts: No atrial level shunt detected by color flow Doppler.  LEFT VENTRICLE PLAX 2D                        Biplane EF (MOD) LVIDd:         3.50 cm         LV Biplane EF:   Left LVIDs:         2.90 cm  ventricular LV PW:         1.20 cm                          ejection LV IVS:        1.30 cm                          fraction by LVOT diam:     1.80 cm                          2D MOD LV SV:         61                               biplane is LV SV Index:   32                               54.7 %. LVOT Area:     2.54 cm                                Diastology                                LV e' medial:    5.33 cm/s LV Volumes (MOD)               LV E/e' medial:  17.4 LV vol d, MOD    55.7 ml       LV e' lateral:   8.81 cm/s A2C:                           LV E/e' lateral: 10.5 LV vol d, MOD    88.6 ml A4C: LV vol s, MOD    31.4 ml A2C: LV vol s, MOD    33.4 ml A4C: LV SV MOD A2C:   24.3 ml LV SV MOD A4C:   88.6 ml LV SV MOD BP:    40.7 ml RIGHT VENTRICLE RV Basal diam:  3.30 cm RV Mid diam:    2.50 cm RV S prime:     15.30 cm/s TAPSE (M-mode): 2.3 cm LEFT ATRIUM             Index        RIGHT ATRIUM           Index LA diam:        3.40 cm 1.78 cm/m   RA Area:     10.40 cm LA Vol  (A2C):   48.2 ml 25.24 ml/m  RA Volume:   22.00 ml  11.52 ml/m LA Vol (A4C):   50.1 ml 26.24 ml/m LA Biplane Vol: 50.1 ml 26.24 ml/m  AORTIC VALVE                     PULMONIC VALVE AV Area (Vmax):    2.10 cm      PV Vmax:       1.10 m/s AV Area (Vmean):   2.27 cm      PV Peak grad:  4.8 mmHg AV Area (VTI):     2.24 cm  AV Vmax:           155.00 cm/s AV Vmean:          110.000 cm/s AV VTI:            0.274 m AV Peak Grad:      9.6 mmHg AV Mean Grad:      5.0 mmHg LVOT Vmax:         128.00 cm/s LVOT Vmean:        98.300 cm/s LVOT VTI:          0.241 m LVOT/AV VTI ratio: 0.88  AORTA Ao Root diam: 3.20 cm Ao Asc diam:  3.20 cm MITRAL VALVE               TRICUSPID VALVE MV Area (PHT): 3.61 cm    TR Peak grad:   31.4 mmHg MV Decel Time: 210 msec    TR Vmax:        280.00 cm/s MR Peak grad: 83.7 mmHg MR Vmax:      457.33 cm/s  SHUNTS MV E velocity: 92.50 cm/s  Systemic VTI:  0.24 m MV A velocity: 80.60 cm/s  Systemic Diam: 1.80 cm MV E/A ratio:  1.15 Adrian Prows MD Electronically signed by Adrian Prows MD Signature Date/Time: 11/02/2022/10:10:39 AM    Final    CARDIAC CATHETERIZATION  Result Date: 11/01/2022 Images from the original result were not included.   Ost Cx to Prox Cx lesion is 80% stenosed.   Dist LAD-3 lesion is 60% stenosed. Dist LAD-2 lesion is 60% stenosed.   Dist LAD-2 lesion is 60% stenosed.   2nd Diag lesion is 90% stenosed.   Ost LAD to Mid LAD lesion is 30% stenosed.   Mid LAD lesion is 90% stenosed.   Dist LAD-1 lesion is 80% stenosed.   Dist LAD-2 lesion is 40% stenosed.   Mid RCA lesion is 100% stenosed.   RPAV lesion is 60% stenosed.   RPDA lesion is 40% stenosed.   There is mild to moderate left ventricular systolic dysfunction.   LV end diastolic pressure is normal.   The left ventricular ejection fraction is 35-45% by visual estimate. Left Heart Catheterization 11/01/22: LV: 70/0, EDP 11 mmHg.  Aortic pressure 72/44, mean 58 mmHg.  Patient received 500 cc bolus with immediate normalization  of blood pressure, 90/66 mmHg. LVEF moderate to severely reduced at 30 to 35%, inferior and septal severe hypokinesis and global hypokinesis. LM: Large vessel, mildly calcified. LAD: Large vessel, gives origin to a moderate-sized D2 with ostial 90% stenosis.  Proximal to D2 there is a focal 90 to 95% stenosis in the LAD.  Mid segment has a 80% stenosis and is diffusely diseased in the mid to distal segment all the way to the apex and moderately diffusely calcified. LCx: Moderate to large caliber vessel, again mild amount of calcification noted throughout the vessel.  Proximal segment has a focal 80% stenosis. RI: Moderate caliber vessel.  Mildly tortuous in the midsegment.  Mild disease. RCA: Very large caliber vessel.  Occluded in the mid segment of very small RV branch.  It has contralateral collaterals from the LAD and circumflex.  There appears to be microchannel's with filling of the RCA all the way to very large PDA and PL branches.  PL has about a 60% proximal stenosis and PDA has about a 30 to 40% stenosis. Impression: Severe multivessel coronary artery disease.  Moderate to severely reduced LVEF, EF anywhere from 30 to 35%, would ideally  be a CABG candidate especially in view of GI bleed and difficulty in dual antiplatelet therapy long-term.  However for now we will manage her medically, obtain the CTS consult electively, do not think that the LAD is bypassable in view of severe disease.  Large RCA with large PDA and PL branch, fairly moderate-sized D2 and large CX are bypassable.   DG Chest Port 1 View  Result Date: 11/01/2022 CLINICAL DATA:  Chest pressure. EXAM: PORTABLE CHEST 1 VIEW COMPARISON:  03/26/2007 FINDINGS: The heart size and mediastinal contours are within normal limits. Both lungs are clear. The visualized skeletal structures are unremarkable. IMPRESSION: No active disease. Electronically Signed   By: Kathreen Devoid M.D.   On: 11/01/2022 11:28     I have independently reviewed the above  radiologic studies and discussed with the patient   Recent Lab Findings: Lab Results  Component Value Date   WBC 11.5 (H) 11/02/2022   HGB 9.1 (L) 11/02/2022   HCT 26.7 (L) 11/02/2022   PLT 224 11/02/2022   GLUCOSE 118 (H) 11/02/2022   CHOL 137 11/02/2022   TRIG 179 (H) 11/02/2022   HDL 31 (L) 11/02/2022   LDLDIRECT 79 11/02/2022   LDLCALC 70 11/02/2022   ALT 17 11/02/2022   AST 26 11/02/2022   NA 135 11/02/2022   K 3.8 11/02/2022   CL 106 11/02/2022   CREATININE 0.77 11/02/2022   BUN 16 11/02/2022   CO2 22 11/02/2022   TSH 8.243 (H) 11/01/2022   INR 1.1 11/02/2022   HGBA1C 8.0 (H) 11/01/2022    Assessment / Plan:     Coronary Artery Disease, requesting CABG consult GI bleed- S/P Clipping, GI following.. hgb at 9.1 today DM- poorly controlled A1c is 8.0 HLD Dispo- patient stable, currently without chest pain.. GI bleed on admission, Hgb up to 9.1.. GI following. Dr. Roxan Hockey will evaluate and discuss timing/candidacy for surgery.  I  spent 55 minutes counseling the patient face to face.   Ellwood Handler, PA-C 11/02/2022 1:27 PM   Patient seen and examined, agree with above See my separate note from 11/5  Remo Lipps C. Roxan Hockey, MD [facility] [phone]

## 2022-11-02 NOTE — Anesthesia Preprocedure Evaluation (Signed)
Anesthesia Evaluation  Patient identified by MRN, date of birth, ID band Patient awake    Reviewed: Allergy & Precautions, H&P , NPO status , Patient's Chart, lab work & pertinent test results  Airway Mallampati: II   Neck ROM: full    Dental   Pulmonary neg pulmonary ROS   breath sounds clear to auscultation       Cardiovascular hypertension, + CAD and + Past MI   Rhythm:regular Rate:Normal     Neuro/Psych  PSYCHIATRIC DISORDERS Anxiety        GI/Hepatic   Endo/Other  diabetes, Type 2    Renal/GU      Musculoskeletal  (+) Arthritis ,    Abdominal   Peds  Hematology   Anesthesia Other Findings   Reproductive/Obstetrics                             Anesthesia Physical Anesthesia Plan  ASA: 3  Anesthesia Plan: MAC   Post-op Pain Management:    Induction: Intravenous  PONV Risk Score and Plan: 2 and Propofol infusion and Treatment may vary due to age or medical condition  Airway Management Planned: Nasal Cannula  Additional Equipment:   Intra-op Plan:   Post-operative Plan:   Informed Consent: I have reviewed the patients History and Physical, chart, labs and discussed the procedure including the risks, benefits and alternatives for the proposed anesthesia with the patient or authorized representative who has indicated his/her understanding and acceptance.     Dental advisory given  Plan Discussed with: CRNA, Anesthesiologist and Surgeon  Anesthesia Plan Comments:        Anesthesia Quick Evaluation

## 2022-11-02 NOTE — Interval H&P Note (Signed)
History and Physical Interval Note:  11/02/2022 10:30 AM  Mercedes Adams  has presented today for surgery, with the diagnosis of anemia,  black stool.  The various methods of treatment have been discussed with the patient and family. After consideration of risks, benefits and other options for treatment, the patient has consented to  Procedure(s): ESOPHAGOGASTRODUODENOSCOPY (EGD) WITH PROPOFOL (N/A) as a surgical intervention.  The patient's history has been reviewed, patient examined, no change in status, stable for surgery.  I have reviewed the patient's chart and labs.  Questions were answered to the patient's satisfaction.     Jackquline Denmark

## 2022-11-02 NOTE — Progress Notes (Signed)
Progress Note  Patient Name: Mercedes Adams Date of Encounter: 11/02/2022  Attending physician: Rex Kras, DO Primary care provider: Isaias Sakai, DO  Subjective: Mercedes Adams is a 66 y.o. female who was seen and examined at bedside  Brother and sister at bedside.  S/p EGD  Post procedure some nausea.  No anginal symptoms.  S/p 2 PRBC yday night.  Case discussed and reviewed with her nurse.  Objective: Vital Signs in the last 24 hours: Temp:  [97.1 F (36.2 C)-99.1 F (37.3 C)] 98.5 F (36.9 C) (11/03 1600) Pulse Rate:  [83-104] 90 (11/03 1600) Resp:  [0-25] 19 (11/03 1600) BP: (81-115)/(46-76) 100/64 (11/03 1600) SpO2:  [91 %-100 %] 94 % (11/03 1600) Weight:  [85.7 kg] 85.7 kg (11/03 0500)  Intake/Output:  Intake/Output Summary (Last 24 hours) at 11/02/2022 1711 Last data filed at 11/02/2022 1117 Gross per 24 hour  Intake 4108.11 ml  Output 1100 ml  Net 3008.11 ml    Net IO Since Admission: 3,051.9 mL [11/02/22 1711]  Weights:  Filed Weights   11/01/22 1023 11/02/22 0500  Weight: 83.9 kg 85.7 kg    Telemetry: Personally reviewed. NSR / ST   Physical examination: PHYSICAL EXAM:    11/02/2022    4:00 PM 11/02/2022    3:30 PM 11/02/2022    3:00 PM  Vitals with BMI  Systolic 202  542  Diastolic 64  64  Pulse 90 91 97   Physical Exam  Constitutional: No distress.  Age appropriate, hemodynamically stable.   Neck: No JVD present.  Cardiovascular: Normal rate, regular rhythm, S1 normal, S2 normal, intact distal pulses and normal pulses. Exam reveals no gallop, no S3 and no S4.  No murmur heard. Pulses:      Dorsalis pedis pulses are 2+ on the right side and 2+ on the left side.       Posterior tibial pulses are 2+ on the right side and 2+ on the left side.  Pulmonary/Chest: Effort normal and breath sounds normal. No stridor. She has no wheezes. She has no rales.  Abdominal: Soft. Bowel sounds are normal. She exhibits no distension. There is no  abdominal tenderness.  Musculoskeletal:        General: No edema.     Cervical back: Neck supple.  Neurological: She is alert and oriented to person, place, and time. She has intact cranial nerves (2-12).  Skin: Skin is warm and moist.   Lab Results: Hematology Recent Labs  Lab 11/01/22 1029 11/01/22 2225 11/02/22 0758  WBC 11.9* 13.1* 11.5*  RBC 2.94* 2.17* 3.05*  HGB 8.5* 6.4* 9.1*  HCT 26.9* 19.7* 26.7*  MCV 91.5 90.8 87.5  MCH 28.9 29.5 29.8  MCHC 31.6 32.5 34.1  RDW 14.6 14.9 14.9  PLT 275 241 224    Chemistry Recent Labs  Lab 11/01/22 1029 11/02/22 0758  NA 138 135  K 3.9 3.8  CL 105 106  CO2 21* 22  GLUCOSE 266* 118*  BUN 13 16  CREATININE 0.88 0.77  CALCIUM 9.0 7.9*  PROT  --  5.5*  ALBUMIN  --  2.7*  AST  --  26  ALT  --  17  ALKPHOS  --  53  BILITOT  --  1.7*  GFRNONAA >60 >60  ANIONGAP 12 7     Cardiac Enzymes: Cardiac Panel (last 3 results) Recent Labs    11/01/22 1029 11/01/22 1143  TROPONINIHS 2,065* 2,487*    BNP (last 3 results) Recent Labs  11/01/22 1029  BNP 283.4*    ProBNP (last 3 results) No results for input(s): "PROBNP" in the last 8760 hours.   DDimer No results for input(s): "DDIMER" in the last 168 hours.   Hemoglobin A1c:  Lab Results  Component Value Date   HGBA1C 8.0 (H) 11/01/2022   MPG 182.9 11/01/2022    TSH  Recent Labs    11/01/22 1028  TSH 8.243*    Lipid Panel     Component Value Date/Time   CHOL 137 11/02/2022 0758   TRIG 179 (H) 11/02/2022 0758   HDL 31 (L) 11/02/2022 0758   CHOLHDL 4.4 11/02/2022 0758   VLDL 36 11/02/2022 0758   LDLCALC 70 11/02/2022 0758   LDLDIRECT 79 11/02/2022 0758    Imaging: ECHOCARDIOGRAM COMPLETE  Result Date: 11/02/2022    ECHOCARDIOGRAM REPORT   Patient Name:   Mercedes Adams Date of Exam: 11/02/2022 Medical Rec #:  [id number]    Height:       64.0 in Accession #:    [id number]   Weight:       188.9 lb Date of Birth:  1956/07/25    BSA:          1.910 m  Patient Age:    71 years     BP:           105/55 mmHg Patient Gender: F            HR:           86 bpm. Exam Location:  Inpatient Procedure: 2D Echo and Intracardiac Opacification Agent Indications:    NSTEMI  History:        Patient has no prior history of Echocardiogram examinations.                 CAD; Risk Factors:Hypertension, Diabetes and Dyslipidemia.  Sonographer:    Harvie Junior Referring Phys: [id number] Adrian Prows  Sonographer Comments: Technically difficult study due to poor echo windows. IMPRESSIONS  1. Definity was used for evaluation of wallmotion and EF estimation. Left ventricular ejection fraction, by estimation, is 50 to 55%. Left ventricular ejection fraction by 2D MOD biplane is 54.7 %. The left ventricle has low normal function. The left ventricle demonstrates regional wall motion abnormalities (see scoring diagram/findings for description). There is mild left ventricular hypertrophy. Left ventricular diastolic parameters are consistent with Grade II diastolic dysfunction (pseudonormalization). Elevated left ventricular end-diastolic pressure. There is mild hypokinesis of the left ventricular, entire inferior wall.  2. Right ventricular systolic function is normal. The right ventricular size is normal. There is normal pulmonary artery systolic pressure. The estimated right ventricular systolic pressure is 19.5 mmHg.  3. Left atrial size was mildly dilated.  4. The mitral valve is normal in structure. Mild to moderate mitral valve regurgitation.  5. The aortic valve was not well visualized. Aortic valve regurgitation is not visualized. Aortic valve sclerosis/calcification is present, without any evidence of aortic stenosis.  6. The inferior vena cava is normal in size with greater than 50% respiratory variability, suggesting right atrial pressure of 3 mmHg. FINDINGS  Left Ventricle: Definity was used for evaluation of wallmotion and EF estimation. Left ventricular ejection fraction, by estimation,  is 50 to 55%. Left ventricular ejection fraction by 2D MOD biplane is 54.7 %. The left ventricle has low normal function. The left ventricle demonstrates regional wall motion abnormalities. Mild hypokinesis of the left ventricular, entire inferior wall. Definity contrast agent was given IV to delineate the left  ventricular endocardial borders. The left ventricular  internal cavity size was small. There is mild left ventricular hypertrophy. Left ventricular diastolic parameters are consistent with Grade II diastolic dysfunction (pseudonormalization). Elevated left ventricular end-diastolic pressure. Right Ventricle: The right ventricular size is normal. No increase in right ventricular wall thickness. Right ventricular systolic function is normal. There is normal pulmonary artery systolic pressure. The tricuspid regurgitant velocity is 2.80 m/s, and  with an assumed right atrial pressure of 3 mmHg, the estimated right ventricular systolic pressure is 24.5 mmHg. Left Atrium: Left atrial size was mildly dilated. Right Atrium: Right atrial size was normal in size. Pericardium: There is no evidence of pericardial effusion. Mitral Valve: The mitral valve is normal in structure. Mild to moderate mitral valve regurgitation. Tricuspid Valve: The tricuspid valve is normal in structure. Tricuspid valve regurgitation is mild. Aortic Valve: The aortic valve was not well visualized. Aortic valve regurgitation is not visualized. Aortic valve sclerosis/calcification is present, without any evidence of aortic stenosis. Aortic valve mean gradient measures 5.0 mmHg. Aortic valve peak gradient measures 9.6 mmHg. Aortic valve area, by VTI measures 2.24 cm. Pulmonic Valve: The pulmonic valve was normal in structure. Pulmonic valve regurgitation is not visualized. No evidence of pulmonic stenosis. Aorta: The aortic root is normal in size and structure. Venous: The inferior vena cava is normal in size with greater than 50% respiratory  variability, suggesting right atrial pressure of 3 mmHg. IAS/Shunts: No atrial level shunt detected by color flow Doppler.  LEFT VENTRICLE PLAX 2D                        Biplane EF (MOD) LVIDd:         3.50 cm         LV Biplane EF:   Left LVIDs:         2.90 cm                          ventricular LV PW:         1.20 cm                          ejection LV IVS:        1.30 cm                          fraction by LVOT diam:     1.80 cm                          2D MOD LV SV:         61                               biplane is LV SV Index:   32                               54.7 %. LVOT Area:     2.54 cm                                Diastology  LV e' medial:    5.33 cm/s LV Volumes (MOD)               LV E/e' medial:  17.4 LV vol d, MOD    55.7 ml       LV e' lateral:   8.81 cm/s A2C:                           LV E/e' lateral: 10.5 LV vol d, MOD    88.6 ml A4C: LV vol s, MOD    31.4 ml A2C: LV vol s, MOD    33.4 ml A4C: LV SV MOD A2C:   24.3 ml LV SV MOD A4C:   88.6 ml LV SV MOD BP:    40.7 ml RIGHT VENTRICLE RV Basal diam:  3.30 cm RV Mid diam:    2.50 cm RV S prime:     15.30 cm/s TAPSE (M-mode): 2.3 cm LEFT ATRIUM             Index        RIGHT ATRIUM           Index LA diam:        3.40 cm 1.78 cm/m   RA Area:     10.40 cm LA Vol (A2C):   48.2 ml 25.24 ml/m  RA Volume:   22.00 ml  11.52 ml/m LA Vol (A4C):   50.1 ml 26.24 ml/m LA Biplane Vol: 50.1 ml 26.24 ml/m  AORTIC VALVE                     PULMONIC VALVE AV Area (Vmax):    2.10 cm      PV Vmax:       1.10 m/s AV Area (Vmean):   2.27 cm      PV Peak grad:  4.8 mmHg AV Area (VTI):     2.24 cm AV Vmax:           155.00 cm/s AV Vmean:          110.000 cm/s AV VTI:            0.274 m AV Peak Grad:      9.6 mmHg AV Mean Grad:      5.0 mmHg LVOT Vmax:         128.00 cm/s LVOT Vmean:        98.300 cm/s LVOT VTI:          0.241 m LVOT/AV VTI ratio: 0.88  AORTA Ao Root diam: 3.20 cm Ao Asc diam:  3.20 cm MITRAL VALVE                TRICUSPID VALVE MV Area (PHT): 3.61 cm    TR Peak grad:   31.4 mmHg MV Decel Time: 210 msec    TR Vmax:        280.00 cm/s MR Peak grad: 83.7 mmHg MR Vmax:      457.33 cm/s  SHUNTS MV E velocity: 92.50 cm/s  Systemic VTI:  0.24 m MV A velocity: 80.60 cm/s  Systemic Diam: 1.80 cm MV E/A ratio:  1.15 Adrian Prows MD Electronically signed by Adrian Prows MD Signature Date/Time: 11/02/2022/10:10:39 AM    Final    CARDIAC CATHETERIZATION  Result Date: 11/01/2022 Images from the original result were not included.   Ost Cx to Prox Cx lesion is 80% stenosed.   Dist LAD-3 lesion is 60% stenosed. Dist LAD-2  lesion is 60% stenosed.   Dist LAD-2 lesion is 60% stenosed.   2nd Diag lesion is 90% stenosed.   Ost LAD to Mid LAD lesion is 30% stenosed.   Mid LAD lesion is 90% stenosed.   Dist LAD-1 lesion is 80% stenosed.   Dist LAD-2 lesion is 40% stenosed.   Mid RCA lesion is 100% stenosed.   RPAV lesion is 60% stenosed.   RPDA lesion is 40% stenosed.   There is mild to moderate left ventricular systolic dysfunction.   LV end diastolic pressure is normal.   The left ventricular ejection fraction is 35-45% by visual estimate. Left Heart Catheterization 11/01/22: LV: 70/0, EDP 11 mmHg.  Aortic pressure 72/44, mean 58 mmHg.  Patient received 500 cc bolus with immediate normalization of blood pressure, 90/66 mmHg. LVEF moderate to severely reduced at 30 to 35%, inferior and septal severe hypokinesis and global hypokinesis. LM: Large vessel, mildly calcified. LAD: Large vessel, gives origin to a moderate-sized D2 with ostial 90% stenosis.  Proximal to D2 there is a focal 90 to 95% stenosis in the LAD.  Mid segment has a 80% stenosis and is diffusely diseased in the mid to distal segment all the way to the apex and moderately diffusely calcified. LCx: Moderate to large caliber vessel, again mild amount of calcification noted throughout the vessel.  Proximal segment has a focal 80% stenosis. RI: Moderate caliber vessel.  Mildly tortuous  in the midsegment.  Mild disease. RCA: Very large caliber vessel.  Occluded in the mid segment of very small RV branch.  It has contralateral collaterals from the LAD and circumflex.  There appears to be microchannel's with filling of the RCA all the way to very large PDA and PL branches.  PL has about a 60% proximal stenosis and PDA has about a 30 to 40% stenosis. Impression: Severe multivessel coronary artery disease.  Moderate to severely reduced LVEF, EF anywhere from 30 to 35%, would ideally be a CABG candidate especially in view of GI bleed and difficulty in dual antiplatelet therapy long-term.  However for now we will manage her medically, obtain the CTS consult electively, do not think that the LAD is bypassable in view of severe disease.  Large RCA with large PDA and PL branch, fairly moderate-sized D2 and large CX are bypassable.   DG Chest Port 1 View  Result Date: 11/01/2022 CLINICAL DATA:  Chest pressure. EXAM: PORTABLE CHEST 1 VIEW COMPARISON:  03/26/2007 FINDINGS: The heart size and mediastinal contours are within normal limits. Both lungs are clear. The visualized skeletal structures are unremarkable. IMPRESSION: No active disease. Electronically Signed   By: Kathreen Devoid M.D.   On: 11/01/2022 11:28    CARDIAC DATABASE: EKG: 11/01/2022: 9:42 AM: Sinus tachycardia, 108 bpm, diffuse ST depressions. 1018: Sinus tachycardia, 110 bpm, diffuse ST depressions concerning for multivessel CAD.  Echocardiogram: 11/02/2022 1. Definity was used for evaluation of wallmotion and EF estimation. Left  ventricular ejection fraction, by estimation, is 50 to 55%. Left  ventricular ejection fraction by 2D MOD biplane is 54.7 %. The left  ventricle has low normal function. The left  ventricle demonstrates regional wall motion abnormalities (see scoring  diagram/findings for description). There is mild left ventricular  hypertrophy. Left ventricular diastolic parameters are consistent with  Grade II  diastolic dysfunction  (pseudonormalization). Elevated left ventricular end-diastolic pressure.  There is mild hypokinesis of the left ventricular, entire inferior wall.   2. Right ventricular systolic function is normal. The right ventricular  size is normal. There is normal pulmonary artery systolic pressure. The  estimated right ventricular systolic pressure is 73.5 mmHg.   3. Left atrial size was mildly dilated.   4. The mitral valve is normal in structure. Mild to moderate mitral valve  regurgitation.   5. The aortic valve was not well visualized. Aortic valve regurgitation  is not visualized. Aortic valve sclerosis/calcification is present,  without any evidence of aortic stenosis.   6. The inferior vena cava is normal in size with greater than 50%  respiratory variability, suggesting right atrial pressure of 3 mmHg.   Stress test: None  Heart catheterization: Left Heart Catheterization 11/01/22:  LV: 70/0, EDP 11 mmHg.  Aortic pressure 72/44, mean 58 mmHg.  Patient received 500 cc bolus with immediate normalization of blood pressure, 90/66 mmHg. LVEF moderate to severely reduced at 30 to 35%, inferior and septal severe hypokinesis and global hypokinesis. LM: Large vessel, mildly calcified. LAD: Large vessel, gives origin to a moderate-sized D2 with ostial 90% stenosis.  Proximal to D2 there is a focal 90 to 95% stenosis in the LAD.  Mid segment has a 80% stenosis and is diffusely diseased in the mid to distal segment all the way to the apex and moderately diffusely calcified. LCx: Moderate to large caliber vessel, again mild amount of calcification noted throughout the vessel.  Proximal segment has a focal 80% stenosis. RI: Moderate caliber vessel.  Mildly tortuous in the midsegment.  Mild disease. RCA: Very large caliber vessel.  Occluded in the mid segment of very small RV branch.  It has contralateral collaterals from the LAD and circumflex.  There appears to be microchannel's  with filling of the RCA all the way to very large PDA and PL branches.  PL has about a 60% proximal stenosis and PDA has about a 30 to 40% stenosis.  Impression: Severe multivessel coronary artery disease.  Moderate to severely reduced LVEF, EF anywhere from 30 to 35%, would ideally be a CABG candidate especially in view of GI bleed and difficulty in dual antiplatelet therapy long-term.  However for now we will manage her medically, obtain the CTS consult electively, do not think that the LAD is bypassable in view of severe disease.  Large RCA with large PDA and PL branch, fairly moderate-sized D2 and large CX are bypassable.  Scheduled Meds:  sodium chloride   Intravenous Once   aspirin EC  81 mg Oral Daily   atorvastatin  80 mg Oral QHS   Chlorhexidine Gluconate Cloth  6 each Topical Daily   empagliflozin  25 mg Oral Daily   folic acid  1 mg Oral Daily   furosemide  10 mg Intravenous Once   heparin  5,000 Units Subcutaneous Q8H   insulin aspart  0-15 Units Subcutaneous Q4H   isosorbide mononitrate  30 mg Oral Daily   levothyroxine  100 mcg Oral Q0600   metoprolol tartrate  25 mg Oral TID   multivitamin with minerals  1 tablet Oral Daily   [START ON 11/05/2022] pantoprazole  40 mg Intravenous Q12H   sodium chloride flush  3 mL Intravenous Q12H    Continuous Infusions:  sodium chloride 500 mL (11/02/22 1556)   sodium chloride     pantoprazole 8 mg/hr (11/02/22 1314)    PRN Meds: sodium chloride, acetaminophen, alum & mag hydroxide-simeth, nitroGLYCERIN, ondansetron (ZOFRAN) IV, sodium chloride flush   IMPRESSION & RECOMMENDATIONS: QUINTA EIMER is a 66 y.o. Caucasian female whose past medical history and cardiac risk  factors include: Multivessel CAD, Upper GIB due to duodenal ulcer s/p epi/clips/PureStat, Hypertension, hyperlipidemia, non-insulin-dependent diabetes mellitus type 2, postmenopausal female, advanced age, obesity.   Impression:  NSTEMI Coronary disease without active  anginal discomfort Acute HFpEF Acute Upper GI Bleed 2/2 Duodenal Ulcer s/p epi/clips/PureStat. Acute Blood Loss Anemia due to duodenal ulcer s/p PRBCs NIDDM Type II Hypertension HLD Obesity due to excess calories.   Recommendations: NSTEMI / CAD:  Presented to the ED with anginal discomfort, EKG changes suggestive of multivessel CAD.  NSTEMI likely precipitated by GIB  Underwent left heart catheterization and was noted to have multivessel CAD. Baseline blood work noted a hemoglobin of 8.5 mg/dL when compared to hemoglobin at 13.8 as of 09/05/2022 with PCP.  Due to concerns for GI bleed did not undergo coronary intervention. Antianginal therapy includes metoprolol, Imdur. 2 units of PRBC since admission due to acute blood loss anemia. Underwent EGD was noted to have duodenal ulcer status post epinephrine/clips.  Dr. Lyndel Safe spoke to Dr. Einar Gip after the procedure and recommends avoiding antiplatelets for approximately 2 weeks if possible. We will arrange cardiothoracic surgery consult for evaluation of CABG candidacy and timing of surgery given the acute upper GI bleed. Will continue medical therapy.   Acute GIB:  Secondary to Duodenal ulcer w/t visible vessel s/p epi/clips/PureStat. Give Protonix (pantoprazole): 8 mg/hr IV by continuous infusion for 72hrs, then Protonix 40BID x 8 weeks, then QD Avoid nonsteroidals Trend CBC. Follow Bx for HP GI following  Acute Blood loss anemia:  2/2 GIB as noted above.  Hb 6.4 on 11/01/2022  S/p 2PRBCs and 9.1g/dL this morning.  GI trending CBC   NIDDM Type II:  A1c 8  Continue SSI   HTN:  Currently borderline due to GI bleeding  Imdur and Metoprolol w/ holding parameters.   HLD:  Continue statins.   Spoke to patient, sister, and brother at bedside their questions and concerns addressed.  RN updated w/ plan of care as well.  Spoke to Napoleon from CTS to help arrange a consult w/ surgeons.   Patient's questions and concerns were addressed to  her satisfaction. She voices understanding of the instructions provided during this encounter.   This note was created using a voice recognition software as a result there may be grammatical errors inadvertently enclosed that do not reflect the nature of this encounter. Every attempt is made to correct such errors.  Total time spent: 59 minutes.   Mechele Claude [facility]  Pager: [phone] Office: [phone] 11/02/2022, 5:11 PM

## 2022-11-02 NOTE — Plan of Care (Signed)
  Problem: Education: Goal: Understanding of cardiac disease, CV risk reduction, and recovery process will improve Outcome: Progressing Goal: Individualized Educational Video(s) Outcome: Progressing   Problem: Activity: Goal: Ability to tolerate increased activity will improve Outcome: Progressing   Problem: Cardiac: Goal: Ability to achieve and maintain adequate cardiovascular perfusion will improve Outcome: Progressing   Problem: Health Behavior/Discharge Planning: Goal: Ability to safely manage health-related needs after discharge will improve Outcome: Progressing   Problem: Education: Goal: Knowledge of General Education information will improve Description: Including pain rating scale, medication(s)/side effects and non-pharmacologic comfort measures Outcome: Progressing   Problem: Health Behavior/Discharge Planning: Goal: Ability to manage health-related needs will improve Outcome: Progressing   Problem: Clinical Measurements: Goal: Ability to maintain clinical measurements within normal limits will improve Outcome: Progressing Goal: Will remain free from infection Outcome: Progressing Goal: Diagnostic test results will improve Outcome: Progressing Goal: Respiratory complications will improve Outcome: Progressing Goal: Cardiovascular complication will be avoided Outcome: Progressing   Problem: Activity: Goal: Risk for activity intolerance will decrease Outcome: Progressing   Problem: Nutrition: Goal: Adequate nutrition will be maintained Outcome: Progressing   Problem: Coping: Goal: Level of anxiety will decrease Outcome: Progressing   Problem: Elimination: Goal: Will not experience complications related to bowel motility Outcome: Progressing Goal: Will not experience complications related to urinary retention Outcome: Progressing   Problem: Pain Managment: Goal: General experience of comfort will improve Outcome: Progressing   Problem:  Safety: Goal: Ability to remain free from injury will improve Outcome: Progressing   Problem: Skin Integrity: Goal: Risk for impaired skin integrity will decrease Outcome: Progressing   Problem: Education: Goal: Ability to describe self-care measures that may prevent or decrease complications (Diabetes Survival Skills Education) will improve Outcome: Progressing Goal: Individualized Educational Video(s) Outcome: Progressing   Problem: Coping: Goal: Ability to adjust to condition or change in health will improve Outcome: Progressing   Problem: Fluid Volume: Goal: Ability to maintain a balanced intake and output will improve Outcome: Progressing   Problem: Health Behavior/Discharge Planning: Goal: Ability to identify and utilize available resources and services will improve Outcome: Progressing Goal: Ability to manage health-related needs will improve Outcome: Progressing   Problem: Metabolic: Goal: Ability to maintain appropriate glucose levels will improve Outcome: Progressing   Problem: Nutritional: Goal: Maintenance of adequate nutrition will improve Outcome: Progressing Goal: Progress toward achieving an optimal weight will improve Outcome: Progressing   Problem: Skin Integrity: Goal: Risk for impaired skin integrity will decrease Outcome: Progressing   Problem: Tissue Perfusion: Goal: Adequacy of tissue perfusion will improve Outcome: Progressing   Problem: Education: Goal: Understanding of CV disease, CV risk reduction, and recovery process will improve Outcome: Progressing Goal: Individualized Educational Video(s) Outcome: Progressing   Problem: Activity: Goal: Ability to return to baseline activity level will improve Outcome: Progressing   Problem: Cardiovascular: Goal: Ability to achieve and maintain adequate cardiovascular perfusion will improve Outcome: Progressing Goal: Vascular access site(s) Level 0-1 will be maintained Outcome: Progressing    Problem: Health Behavior/Discharge Planning: Goal: Ability to safely manage health-related needs after discharge will improve Outcome: Progressing

## 2022-11-02 NOTE — Progress Notes (Addendum)
ON-CALL CARDIOLOGY 11/02/22  Patient's name: Mercedes Adams   MRN: [id number].    DOB: August 24, 1956 Primary care provider: Isaias Sakai, DO. Primary cardiologist: NA  Interaction regarding this patient's care today: Her RN (amanda) called to hold evening dose of BB as her BP was running low. Agreed.   Given the concern for possible GI bleed and soft BP.   Recommended checking CBC and Type and screen.   Repeat Hb was 6.4g/dL - she was asymptomatic w/ the exception of hypotension per VS.   Order 2unit PRBCs after Type and Screen is done.   Spoke to [place] - she is about to start the blood transfusion.   Recommended that if SBP >110mHG after the blood is given give Lasix '10mg'$  po once.   Will await morning labs.   Impression:  Acute GI Bleed - suspect UGIB  NSTEMI Multivessel CAD  Plan:  Transfuse 2PRBCs for now.  Lasix if SBP>1229mG after transfusion.  Await morning labs.   Time Spent: 30 minutes.   SuRex KrasDONevadaFAUpstate Gastroenterology LLCPager: [phone]: [phone]

## 2022-11-02 NOTE — Op Note (Signed)
[facility] Patient Name: Mercedes Adams Procedure Date : 11/02/2022 MRN: [id number] Attending MD: Jackquline Denmark , MD, [id number] Date of Birth: [birth year] CSN: [id number] Age: 66 Admit Type: Inpatient Procedure:                Upper GI endoscopy Indications:              Melena with Hb 6.4 s/p2U to 9. Providers:                Jackquline Denmark, MD, Dulcy Fanny, Cherylynn Ridges,                            Technician, Larene Beach, CRNA Referring MD:             Dr Nadyne Coombes. Medicines:                Monitored Anesthesia Care Complications:            No immediate complications. Estimated Blood Loss:     Estimated blood loss: none. Procedure:                Pre-Anesthesia Assessment:                           - Prior to the procedure, a History and Physical                            was performed, and patient medications and                            allergies were reviewed. The patient's tolerance of                            previous anesthesia was also reviewed. The risks                            and benefits of the procedure and the sedation                            options and risks were discussed with the patient.                            All questions were answered, and informed consent                            was obtained. Prior Anticoagulants: The patient has                            taken no anticoagulant or antiplatelet agents. ASA                            Grade Assessment: III - A patient with severe                            systemic disease. After reviewing the risks and  benefits, the patient was deemed in satisfactory                            condition to undergo the procedure.                           After obtaining informed consent, the endoscope was                            passed under direct vision. Throughout the                            procedure, the patient's blood pressure, pulse, and                             oxygen saturations were monitored continuously. The                            GIF-H190 ([id number]) Olympus endoscope was introduced                            through the mouth, and advanced to the second part                            of duodenum. The upper GI endoscopy was                            accomplished without difficulty. The patient                            tolerated the procedure well. Scope In: Scope Out: Findings:      The examined esophagus was normal with well-defined Z-line.      A small hiatal hernia was present.      Diffuse mild inflammation characterized by erythema was found in the       gastric antrum. 2 Biopsies were taken with a cold forceps for histology.      One deep cratered duodenal ulcer with a visible vessel was found in       D1/D2 junction with mild duodenal stenosis. The lesion was 12 mm in       largest dimension. Area was successfully injected with 3 mL of a 0.1       mg/mL solution of epinephrine for hemostasis. Two hemostatic clips were       successfully placed (MR conditional) followed by 3 ml of PureStat. There       was no bleeding at the end of the procedure. Impression:               - Duodenal ulcer w/t visible vessel s/p                            epi/clips/PureStat.                           - Small hiatal hernia.                           -  Gastritis. Biopsied x 2. Recommendation:           - Return patient to hospital ward for ongoing care.                           - Clear liquid diet.                           - Give Protonix (pantoprazole): 8 mg/hr IV by                            continuous infusion for 72hrs, then Protonix 40BID                            x 8 weeks, then QD                           - Avoid nonsteroidals                           - Trend CBC.                           - Follow Bx for HP                           - The findings and recommendations were discussed                             with the patient's family.                           - The findings and recommendations were discussed                            with the referring physician. Procedure Code(s):        --- Professional ---                           21308, 68, Esophagogastroduodenoscopy, flexible,                            transoral; with control of bleeding, any method                           43239, Esophagogastroduodenoscopy, flexible,                            transoral; with biopsy, single or multiple Diagnosis Code(s):        --- Professional ---                           K44.9, Diaphragmatic hernia without obstruction or                            gangrene  K29.70, Gastritis, unspecified, without bleeding                           K26.4, Chronic or unspecified duodenal ulcer with                            hemorrhage                           K92.1, Melena (includes Hematochezia) CPT copyright 2022 [employer]. All rights reserved. The codes documented in this report are preliminary and upon coder review may  be revised to meet current compliance requirements. Jackquline Denmark, MD 11/02/2022 11:36:21 AM This report has been signed electronically. Number of Addenda: 0

## 2022-11-02 NOTE — Anesthesia Postprocedure Evaluation (Signed)
Anesthesia Post Note  Patient: Mercedes Adams  Procedure(s) Performed: ESOPHAGOGASTRODUODENOSCOPY (EGD) WITH PROPOFOL HEMOSTASIS CONTROL HEMOSTASIS CLIP PLACEMENT BIOPSY     Patient location during evaluation: PACU Anesthesia Type: MAC Level of consciousness: awake and alert Pain management: pain level controlled Vital Signs Assessment: post-procedure vital signs reviewed and stable Respiratory status: spontaneous breathing, nonlabored ventilation, respiratory function stable and patient connected to nasal cannula oxygen Cardiovascular status: stable and blood pressure returned to baseline Postop Assessment: no apparent nausea or vomiting Anesthetic complications: no   No notable events documented.  Last Vitals:  Vitals:   11/02/22 1130 11/02/22 1145  BP: 98/72 (!) 113/59  Pulse: 96 94  Resp: 11 17  Temp:  36.9 C  SpO2: 95% 96%    Last Pain:  Vitals:   11/02/22 1145  TempSrc:   PainSc: 0-No pain                 Vestal Crandall S

## 2022-11-02 NOTE — Transfer of Care (Signed)
Immediate Anesthesia Transfer of Care Note  Patient: TYNEKA SCAFIDI  Procedure(s) Performed: ESOPHAGOGASTRODUODENOSCOPY (EGD) WITH PROPOFOL HEMOSTASIS CONTROL HEMOSTASIS CLIP PLACEMENT BIOPSY  Patient Location: PACU  Anesthesia Type:MAC  Level of Consciousness: drowsy and patient cooperative  Airway & Oxygen Therapy: Patient Spontanous Breathing and Patient connected to nasal cannula oxygen  Post-op Assessment: Report given to RN, Post -op Vital signs reviewed and stable, and Patient moving all extremities X 4  Post vital signs: Reviewed and stable  Last Vitals:  Vitals Value Taken Time  BP 115/62 11/02/22 1122  Temp    Pulse 103 11/02/22 1124  Resp 15 11/02/22 1124  SpO2 94 % 11/02/22 1124  Vitals shown include unvalidated device data.  Last Pain:  Vitals:   11/02/22 0953  TempSrc: Temporal  PainSc: 0-No pain         Complications: No notable events documented.

## 2022-11-02 NOTE — Progress Notes (Signed)
  Echocardiogram 2D Echocardiogram has been performed.  Lana Fish 11/02/2022, 9:36 AM

## 2022-11-02 NOTE — Progress Notes (Signed)
Explained results of cbc to pt and hgb 6.4. Risks and benefits of blood transfusion explained and Blood transfusion Consent obtained.

## 2022-11-03 DIAGNOSIS — E781 Pure hyperglyceridemia: Secondary | ICD-10-CM

## 2022-11-03 DIAGNOSIS — K921 Melena: Secondary | ICD-10-CM

## 2022-11-03 DIAGNOSIS — I214 Non-ST elevation (NSTEMI) myocardial infarction: Principal | ICD-10-CM

## 2022-11-03 HISTORY — DX: Pure hyperglyceridemia: E78.1

## 2022-11-03 LAB — CBC WITH DIFFERENTIAL/PLATELET
Abs Immature Granulocytes: 0.03 10*3/uL (ref 0.00–0.07)
Abs Immature Granulocytes: 0.03 10*3/uL (ref 0.00–0.07)
Basophils Absolute: 0.1 10*3/uL (ref 0.0–0.1)
Basophils Absolute: 0 10*3/uL (ref 0.0–0.1)
Basophils Relative: 1 %
Basophils Relative: 0 %
Eosinophils Absolute: 0.4 10*3/uL (ref 0.0–0.5)
Eosinophils Absolute: 0.4 10*3/uL (ref 0.0–0.5)
Eosinophils Relative: 4 %
Eosinophils Relative: 4 %
HCT: 26.5 % — ABNORMAL LOW (ref 36.0–46.0)
HCT: 28 % — ABNORMAL LOW (ref 36.0–46.0)
Hemoglobin: 8.7 g/dL — ABNORMAL LOW (ref 12.0–15.0)
Hemoglobin: 9 g/dL — ABNORMAL LOW (ref 12.0–15.0)
Immature Granulocytes: 0 %
Immature Granulocytes: 0 %
Lymphocytes Relative: 21 %
Lymphocytes Relative: 23 %
Lymphs Abs: 2.1 10*3/uL (ref 0.7–4.0)
Lymphs Abs: 2.4 10*3/uL (ref 0.7–4.0)
MCH: 29.2 pg (ref 26.0–34.0)
MCH: 29.3 pg (ref 26.0–34.0)
MCHC: 32.8 g/dL (ref 30.0–36.0)
MCHC: 32.1 g/dL (ref 30.0–36.0)
MCV: 88.9 fL (ref 80.0–100.0)
MCV: 91.2 fL (ref 80.0–100.0)
Monocytes Absolute: 0.7 10*3/uL (ref 0.1–1.0)
Monocytes Absolute: 0.8 10*3/uL (ref 0.1–1.0)
Monocytes Relative: 7 %
Monocytes Relative: 7 %
Neutro Abs: 6.8 10*3/uL (ref 1.7–7.7)
Neutro Abs: 6.8 10*3/uL (ref 1.7–7.7)
Neutrophils Relative %: 67 %
Neutrophils Relative %: 66 %
Platelets: 223 10*3/uL (ref 150–400)
Platelets: 258 10*3/uL (ref 150–400)
RBC: 2.98 MIL/uL — ABNORMAL LOW (ref 3.87–5.11)
RBC: 3.07 MIL/uL — ABNORMAL LOW (ref 3.87–5.11)
RDW: 15.4 % (ref 11.5–15.5)
RDW: 15.3 % (ref 11.5–15.5)
WBC: 10 10*3/uL (ref 4.0–10.5)
WBC: 10.4 10*3/uL (ref 4.0–10.5)
nRBC: 0 % (ref 0.0–0.2)
nRBC: 0 % (ref 0.0–0.2)

## 2022-11-03 LAB — BASIC METABOLIC PANEL
Anion gap: 11 (ref 5–15)
BUN: 8 mg/dL (ref 8–23)
CO2: 23 mmol/L (ref 22–32)
Calcium: 8.3 mg/dL — ABNORMAL LOW (ref 8.9–10.3)
Chloride: 105 mmol/L (ref 98–111)
Creatinine, Ser: 0.76 mg/dL (ref 0.44–1.00)
GFR, Estimated: >60 mL/min (ref >60)
Glucose, Bld: 106 mg/dL — ABNORMAL HIGH (ref 70–99)
Potassium: 3.7 mmol/L (ref 3.5–5.1)
Sodium: 139 mmol/L (ref 135–145)

## 2022-11-03 LAB — BPAM RBC
Blood Product Expiration Date: 202311172359
Blood Product Expiration Date: 202311172359
ISSUE DATE / TIME: 202311022343
ISSUE DATE / TIME: 202311030351
Unit Type and Rh: 9500
Unit Type and Rh: 9500

## 2022-11-03 LAB — TYPE AND SCREEN
ABO/RH(D): O NEG
Antibody Screen: NEGATIVE
Unit division: 0
Unit division: 0

## 2022-11-03 LAB — GLUCOSE, CAPILLARY
Glucose-Capillary: 110 mg/dL — ABNORMAL HIGH (ref 70–99)
Glucose-Capillary: 178 mg/dL — ABNORMAL HIGH (ref 70–99)
Glucose-Capillary: 110 mg/dL — ABNORMAL HIGH (ref 70–99)
Glucose-Capillary: 126 mg/dL — ABNORMAL HIGH (ref 70–99)
Glucose-Capillary: 206 mg/dL — ABNORMAL HIGH (ref 70–99)
Glucose-Capillary: 126 mg/dL — ABNORMAL HIGH (ref 70–99)

## 2022-11-03 LAB — LIPOPROTEIN A (LPA): Lipoprotein (a): 46.1 nmol/L — ABNORMAL HIGH

## 2022-11-03 LAB — HEMOGLOBIN AND HEMATOCRIT, BLOOD
HCT: 27 % — ABNORMAL LOW (ref 36.0–46.0)
Hemoglobin: 9 g/dL — ABNORMAL LOW (ref 12.0–15.0)

## 2022-11-03 MED ORDER — INSULIN ASPART 100 UNIT/ML IJ SOLN
0.0000 [IU] | Freq: Every day | INTRAMUSCULAR | Status: DC
  Administered 2022-11-03: 2 [IU] via SUBCUTANEOUS

## 2022-11-03 MED ORDER — FENOFIBRATE 160 MG PO TABS
160.0000 mg | ORAL_TABLET | Freq: Every day | ORAL | Status: DC
  Administered 2022-11-03 – 2022-11-05 (×3): 160 mg via ORAL
  Filled 2022-11-03 (×3): qty 1

## 2022-11-03 MED ORDER — POTASSIUM CHLORIDE 20 MEQ PO PACK: 40.0000 meq | PACK | Freq: Once | ORAL | Status: DC

## 2022-11-03 MED ORDER — POTASSIUM CHLORIDE 20 MEQ PO PACK
20.0000 meq | PACK | Freq: Once | ORAL | Status: AC
  Administered 2022-11-03: 20 meq via ORAL
  Filled 2022-11-03: qty 1

## 2022-11-03 MED ORDER — INSULIN ASPART 100 UNIT/ML IJ SOLN
0.0000 [IU] | Freq: Three times a day (TID) | INTRAMUSCULAR | Status: DC
  Administered 2022-11-03 – 2022-11-04 (×2): 3 [IU] via SUBCUTANEOUS
  Administered 2022-11-04: 4 [IU] via SUBCUTANEOUS
  Administered 2022-11-04 – 2022-11-05 (×2): 3 [IU] via SUBCUTANEOUS
  Administered 2022-11-05: 4 [IU] via SUBCUTANEOUS

## 2022-11-03 MED ORDER — ISOSORBIDE MONONITRATE ER 30 MG PO TB24
15.0000 mg | ORAL_TABLET | Freq: Every day | ORAL | Status: DC
  Filled 2022-11-03: qty 1

## 2022-11-03 NOTE — Progress Notes (Addendum)
Daily Progress Note  Hospital Day: 3  Chief Complaint: GI bleed   Attending physician's note   I have taken a history, reviewed the chart and examined the patient. I performed a substantive portion of this encounter, including complete performance of at least one of the key components, in conjunction with the APP. I agree with the APP's note, impression and recommendations.    66 year old very pleasant female with history of CAD, admitted with NSTEMI and melena  S/p EGD 11/02/22 with duodenal ulcer, visible vessel was intervened endoscopically with epi injection and Endo Clip Hemoglobin stable in the past 24 hours s/p PRBC transfusion  Continue PPI gtt. for 72 hours and then transition to pantoprazole 40 mg IV twice daily during hospitalization, oral 40 mg pantoprazole twice daily on discharge and continue for 3 months  Avoid NSAIDs  Advance diet to mechanical soft for 1 week and then resume regular diet  Await gastric biopsies, if H. pylori positive will treat with antibiotics once acute issues resolve  Monitor hemoglobin and transfuse as needed  GI will sign off but available if have any questions or has any change in clinical status   The patient was provided an opportunity to ask questions and all were answered. The patient agreed with the plan and demonstrated an understanding of the instructions.   Damaris Hippo , MD [phone]     Brief History 66 yo female with a pmh significant for PUD, adenomatous colon polyps, MD and CAD. Admitted with NSTEMI.  She reported black stools at home prior to admission. Hgb 8 range on admission. Baseline hgb not known.  Seen in consultation by [place] on 11/2 for GI bleed  Assessment / Plan   # GI bleed secondary to duodenal ulcer with visible vessel, s/p epi injection  / endoclip  on 11/3.  Continue PPI infusion through today then then change to BID Small dark BM today, likely old blood. Asking for diet advancement. Will given  carb modified diet.   # Gastritis biopsies pending   # Anemia, probably iron deficient with ferritin of 16. Hgb in 8 range on admission then down to 6.4. Baseline hgb not really known.   Hgb improved to 8.7 with 2 u PRBCs  Subjective   Feels okay. Had small dark stool this am. No abdominal pain. Wants to advance diet.   Objective   Endoscopic studies:  EGD  -Duodenal ulcer w/t visible vessel s/p epi/clips/PureStat. - Small hiatal hernia - Gastritis, biopsied  Imaging:  ECHOCARDIOGRAM COMPLETE  Result Date: 11/02/2022    ECHOCARDIOGRAM REPORT   Patient Name:   Mercedes Adams Date of Exam: 11/02/2022 Medical Rec #:  [id number]    Height:       64.0 in Accession #:    [id number]   Weight:       188.9 lb Date of Birth:  10/28/1956    BSA:          1.910 m Patient Age:    55 years     BP:           105/55 mmHg Patient Gender: F            HR:           86 bpm. Exam Location:  Inpatient Procedure: 2D Echo and Intracardiac Opacification Agent Indications:    NSTEMI  History:        Patient has no prior history of Echocardiogram examinations.  CAD; Risk Factors:Hypertension, Diabetes and Dyslipidemia.  Sonographer:    Harvie Junior Referring Phys: [id number] Mercedes Adams  Sonographer Comments: Technically difficult study due to poor echo windows. IMPRESSIONS  1. Definity was used for evaluation of wallmotion and EF estimation. Left ventricular ejection fraction, by estimation, is 50 to 55%. Left ventricular ejection fraction by 2D MOD biplane is 54.7 %. The left ventricle has low normal function. The left ventricle demonstrates regional wall motion abnormalities (see scoring diagram/findings for description). There is mild left ventricular hypertrophy. Left ventricular diastolic parameters are consistent with Grade II diastolic dysfunction (pseudonormalization). Elevated left ventricular end-diastolic pressure. There is mild hypokinesis of the left ventricular, entire inferior wall.  2. Right  ventricular systolic function is normal. The right ventricular size is normal. There is normal pulmonary artery systolic pressure. The estimated right ventricular systolic pressure is 40.9 mmHg.  3. Left atrial size was mildly dilated.  4. The mitral valve is normal in structure. Mild to moderate mitral valve regurgitation.  5. The aortic valve was not well visualized. Aortic valve regurgitation is not visualized. Aortic valve sclerosis/calcification is present, without any evidence of aortic stenosis.  6. The inferior vena cava is normal in size with greater than 50% respiratory variability, suggesting right atrial pressure of 3 mmHg. FINDINGS  Left Ventricle: Definity was used for evaluation of wallmotion and EF estimation. Left ventricular ejection fraction, by estimation, is 50 to 55%. Left ventricular ejection fraction by 2D MOD biplane is 54.7 %. The left ventricle has low normal function. The left ventricle demonstrates regional wall motion abnormalities. Mild hypokinesis of the left ventricular, entire inferior wall. Definity contrast agent was given IV to delineate the left ventricular endocardial borders. The left ventricular  internal cavity size was small. There is mild left ventricular hypertrophy. Left ventricular diastolic parameters are consistent with Grade II diastolic dysfunction (pseudonormalization). Elevated left ventricular end-diastolic pressure. Right Ventricle: The right ventricular size is normal. No increase in right ventricular wall thickness. Right ventricular systolic function is normal. There is normal pulmonary artery systolic pressure. The tricuspid regurgitant velocity is 2.80 m/s, and  with an assumed right atrial pressure of 3 mmHg, the estimated right ventricular systolic pressure is 73.5 mmHg. Left Atrium: Left atrial size was mildly dilated. Right Atrium: Right atrial size was normal in size. Pericardium: There is no evidence of pericardial effusion. Mitral Valve: The mitral  valve is normal in structure. Mild to moderate mitral valve regurgitation. Tricuspid Valve: The tricuspid valve is normal in structure. Tricuspid valve regurgitation is mild. Aortic Valve: The aortic valve was not well visualized. Aortic valve regurgitation is not visualized. Aortic valve sclerosis/calcification is present, without any evidence of aortic stenosis. Aortic valve mean gradient measures 5.0 mmHg. Aortic valve peak gradient measures 9.6 mmHg. Aortic valve area, by VTI measures 2.24 cm. Pulmonic Valve: The pulmonic valve was normal in structure. Pulmonic valve regurgitation is not visualized. No evidence of pulmonic stenosis. Aorta: The aortic root is normal in size and structure. Venous: The inferior vena cava is normal in size with greater than 50% respiratory variability, suggesting right atrial pressure of 3 mmHg. IAS/Shunts: No atrial level shunt detected by color flow Doppler.  LEFT VENTRICLE PLAX 2D                        Biplane EF (MOD) LVIDd:         3.50 cm         LV Biplane EF:   Left  LVIDs:         2.90 cm                          ventricular LV PW:         1.20 cm                          ejection LV IVS:        1.30 cm                          fraction by LVOT diam:     1.80 cm                          2D MOD LV SV:         61                               biplane is LV SV Index:   32                               54.7 %. LVOT Area:     2.54 cm                                Diastology                                LV e' medial:    5.33 cm/s LV Volumes (MOD)               LV E/e' medial:  17.4 LV vol d, MOD    55.7 ml       LV e' lateral:   8.81 cm/s A2C:                           LV E/e' lateral: 10.5 LV vol d, MOD    88.6 ml A4C: LV vol s, MOD    31.4 ml A2C: LV vol s, MOD    33.4 ml A4C: LV SV MOD A2C:   24.3 ml LV SV MOD A4C:   88.6 ml LV SV MOD BP:    40.7 ml RIGHT VENTRICLE RV Basal diam:  3.30 cm RV Mid diam:    2.50 cm RV S prime:     15.30 cm/s TAPSE (M-mode): 2.3 cm LEFT  ATRIUM             Index        RIGHT ATRIUM           Index LA diam:        3.40 cm 1.78 cm/m   RA Area:     10.40 cm LA Vol (A2C):   48.2 ml 25.24 ml/m  RA Volume:   22.00 ml  11.52 ml/m LA Vol (A4C):   50.1 ml 26.24 ml/m LA Biplane Vol: 50.1 ml 26.24 ml/m  AORTIC VALVE                     PULMONIC VALVE AV Area (Vmax):    2.10 cm      PV  Vmax:       1.10 m/s AV Area (Vmean):   2.27 cm      PV Peak grad:  4.8 mmHg AV Area (VTI):     2.24 cm AV Vmax:           155.00 cm/s AV Vmean:          110.000 cm/s AV VTI:            0.274 m AV Peak Grad:      9.6 mmHg AV Mean Grad:      5.0 mmHg LVOT Vmax:         128.00 cm/s LVOT Vmean:        98.300 cm/s LVOT VTI:          0.241 m LVOT/AV VTI ratio: 0.88  AORTA Ao Root diam: 3.20 cm Ao Asc diam:  3.20 cm MITRAL VALVE               TRICUSPID VALVE MV Area (PHT): 3.61 cm    TR Peak grad:   31.4 mmHg MV Decel Time: 210 msec    TR Vmax:        280.00 cm/s MR Peak grad: 83.7 mmHg MR Vmax:      457.33 cm/s  SHUNTS MV E velocity: 92.50 cm/s  Systemic VTI:  0.24 m MV A velocity: 80.60 cm/s  Systemic Diam: 1.80 cm MV E/A ratio:  1.15 Mercedes Prows MD Electronically signed by Mercedes Prows MD Signature Date/Time: 11/02/2022/10:10:39 AM    Final    CARDIAC CATHETERIZATION  Result Date: 11/01/2022 Images from the original result were not included.   Ost Cx to Prox Cx lesion is 80% stenosed.   Dist LAD-3 lesion is 60% stenosed. Dist LAD-2 lesion is 60% stenosed.   Dist LAD-2 lesion is 60% stenosed.   2nd Diag lesion is 90% stenosed.   Ost LAD to Mid LAD lesion is 30% stenosed.   Mid LAD lesion is 90% stenosed.   Dist LAD-1 lesion is 80% stenosed.   Dist LAD-2 lesion is 40% stenosed.   Mid RCA lesion is 100% stenosed.   RPAV lesion is 60% stenosed.   RPDA lesion is 40% stenosed.   There is mild to moderate left ventricular systolic dysfunction.   LV end diastolic pressure is normal.   The left ventricular ejection fraction is 35-45% by visual estimate. Left Heart Catheterization  11/01/22: LV: 70/0, EDP 11 mmHg.  Aortic pressure 72/44, mean 58 mmHg.  Patient received 500 cc bolus with immediate normalization of blood pressure, 90/66 mmHg. LVEF moderate to severely reduced at 30 to 35%, inferior and septal severe hypokinesis and global hypokinesis. LM: Large vessel, mildly calcified. LAD: Large vessel, gives origin to a moderate-sized D2 with ostial 90% stenosis.  Proximal to D2 there is a focal 90 to 95% stenosis in the LAD.  Mid segment has a 80% stenosis and is diffusely diseased in the mid to distal segment all the way to the apex and moderately diffusely calcified. LCx: Moderate to large caliber vessel, again mild amount of calcification noted throughout the vessel.  Proximal segment has a focal 80% stenosis. RI: Moderate caliber vessel.  Mildly tortuous in the midsegment.  Mild disease. RCA: Very large caliber vessel.  Occluded in the mid segment of very small RV branch.  It has contralateral collaterals from the LAD and circumflex.  There appears to be microchannel's with filling of the RCA all the way to very large PDA and PL branches.  PL has about a 60% proximal stenosis and PDA has about a 30 to 40% stenosis. Impression: Severe multivessel coronary artery disease.  Moderate to severely reduced LVEF, EF anywhere from 30 to 35%, would ideally be a CABG candidate especially in view of GI bleed and difficulty in dual antiplatelet therapy long-term.  However for now we will manage her medically, obtain the CTS consult electively, do not think that the LAD is bypassable in view of severe disease.  Large RCA with large PDA and PL branch, fairly moderate-sized D2 and large CX are bypassable.   DG Chest Port 1 View  Result Date: 11/01/2022 CLINICAL DATA:  Chest pressure. EXAM: PORTABLE CHEST 1 VIEW COMPARISON:  03/26/2007 FINDINGS: The heart size and mediastinal contours are within normal limits. Both lungs are clear. The visualized skeletal structures are unremarkable. IMPRESSION: No  active disease. Electronically Signed   By: Kathreen Devoid M.D.   On: 11/01/2022 11:28    Lab Results: Recent Labs    11/01/22 2225 11/02/22 0758 11/02/22 1840 11/03/22 0634  WBC 13.1* 11.5*  --  10.0  HGB 6.4* 9.1* 8.5* 8.7*  HCT 19.7* 26.7* 25.3* 26.5*  PLT 241 224  --  223   BMET Recent Labs    11/01/22 1029 11/02/22 0758  NA 138 135  K 3.9 3.8  CL 105 106  CO2 21* 22  GLUCOSE 266* 118*  BUN 13 16  CREATININE 0.88 0.77  CALCIUM 9.0 7.9*   LFT Recent Labs    11/02/22 0758  PROT 5.5*  ALBUMIN 2.7*  AST 26  ALT 17  ALKPHOS 53  BILITOT 1.7*   PT/INR Recent Labs    11/02/22 0758  LABPROT 13.8  INR 1.1    Scheduled inpatient medications:   sodium chloride   Intravenous Once   aspirin EC  81 mg Oral Daily   atorvastatin  80 mg Oral QHS   Chlorhexidine Gluconate Cloth  6 each Topical Daily   docusate sodium  100 mg Oral BID   empagliflozin  25 mg Oral Daily   folic acid  1 mg Oral Daily   furosemide  10 mg Intravenous Once   heparin  5,000 Units Subcutaneous Q8H   insulin aspart  0-15 Units Subcutaneous Q4H   isosorbide mononitrate  30 mg Oral Daily   levothyroxine  100 mcg Oral Q0600   metoprolol tartrate  25 mg Oral TID   multivitamin with minerals  1 tablet Oral Daily   [START ON 11/05/2022] pantoprazole  40 mg Intravenous Q12H   senna  1 tablet Oral Daily   sodium chloride flush  3 mL Intravenous Q12H   Continuous inpatient infusions:   sodium chloride 10 mL/hr at 11/03/22 8841   sodium chloride     pantoprazole 8 mg/hr (11/03/22 0213)   PRN inpatient medications: sodium chloride, acetaminophen, alum & mag hydroxide-simeth, nitroGLYCERIN, ondansetron (ZOFRAN) IV, sodium chloride flush  Vital signs in last 24 hours: Temp:  [97.1 F (36.2 C)-98.8 F (37.1 C)] 98.8 F (37.1 C) (11/04 0723) Pulse Rate:  [75-104] 92 (11/04 0700) Resp:  [11-26] 20 (11/04 0700) BP: (92-125)/(53-76) 125/72 (11/04 0700) SpO2:  [90 %-100 %] 94 % (11/04 0700) Weight:   [87.8 kg] 87.8 kg (11/04 0500)    Intake/Output Summary (Last 24 hours) at 11/03/2022 0849 Last data filed at 11/03/2022 0723 Gross per 24 hour  Intake 1002.45 ml  Output 700 ml  Net 302.45 ml    Intake/Output from previous day: 11/03 0701 - 11/04  0700 In: 927.2 [P.O.:450; I.V.:227.2; IV Piggyback:250] Out: 700 [Urine:700] Intake/Output this shift: Total I/O In: 120 [P.O.:120] Out: -    Physical Exam:  General: Alert female in NAD.  Heart:  Regular rate and rhythm. No lower extremity edema Pulmonary: Normal respiratory effort Abdomen: Soft, nondistended, nontender. Normal bowel sounds.  Neurologic: Alert and oriented Psych: Pleasant. Cooperative.    Principal Problem:   NSTEMI (non-ST elevated myocardial infarction) ([place]) Active Problems:   DM (diabetes mellitus) ([place])   Benign hypertension   Mixed hyperlipidemia   Coronary artery disease involving native coronary artery of native heart without angina pectoris   Acute GI bleeding   Duodenal ulcer   Acute heart failure with preserved ejection fraction (HFpEF) ([facility])   Acute blood loss anemia   Class 1 obesity due to excess calories with serious comorbidity and body mass index (BMI) of 32.0 to 32.9 in adult     LOS: 2 days   Tye Savoy ,NP 11/03/2022, 8:49 AM

## 2022-11-03 NOTE — Plan of Care (Signed)
Problem: Education: Goal: Understanding of cardiac disease, CV risk reduction, and recovery process will improve 11/03/2022 2221 by Mercedes Adams, Mercedes Fillers, RN Outcome: Progressing 11/03/2022 2221 by Mercedes Adams, Mercedes Fillers, RN Outcome: Progressing Goal: Individualized Educational Video(s) 11/03/2022 2221 by Mercedes Adams, Mercedes Fillers, RN Outcome: Progressing 11/03/2022 2221 by Mercedes Adams, Mercedes Fillers, RN Outcome: Progressing   Problem: Activity: Goal: Ability to tolerate increased activity will improve 11/03/2022 2221 by Mercedes Adams, Mercedes Fillers, RN Outcome: Progressing 11/03/2022 2221 by Mercedes Adams, Mercedes Fillers, RN Outcome: Progressing   Problem: Cardiac: Goal: Ability to achieve and maintain adequate cardiovascular perfusion will improve 11/03/2022 2221 by Mercedes Adams, Mercedes Fillers, RN Outcome: Progressing 11/03/2022 2221 by Mercedes Adams, Mercedes Fillers, RN Outcome: Progressing   Problem: Health Behavior/Discharge Planning: Goal: Ability to safely manage health-related needs after discharge will improve 11/03/2022 2221 by Mercedes Adams, Mercedes Fillers, RN Outcome: Progressing 11/03/2022 2221 by Mercedes Adams, Mercedes Fillers, RN Outcome: Progressing   Problem: Education: Goal: Knowledge of General Education information will improve Description: Including pain rating scale, medication(s)/side effects and non-pharmacologic comfort measures 11/03/2022 2221 by Mercedes Adams, Mercedes Fillers, RN Outcome: Progressing 11/03/2022 2221 by Mercedes Adams, Mercedes Fillers, RN Outcome: Progressing   Problem: Health Behavior/Discharge Planning: Goal: Ability to manage health-related needs will improve 11/03/2022 2221 by Mercedes Adams, Mercedes Fillers, RN Outcome: Progressing 11/03/2022 2221 by Mercedes Adams, Mercedes Fillers, RN Outcome: Progressing   Problem: Clinical Measurements: Goal: Ability to maintain clinical measurements within normal limits will improve 11/03/2022 2221 by Mercedes Adams,  Mercedes Fillers, RN Outcome: Progressing 11/03/2022 2221 by Mercedes Adams, Mercedes Fillers, RN Outcome: Progressing Goal: Will remain free from infection 11/03/2022 2221 by Mercedes Fantasia, RN Outcome: Progressing 11/03/2022 2221 by Mercedes Adams, Mercedes Fillers, RN Outcome: Progressing Goal: Diagnostic test results will improve 11/03/2022 2221 by Mercedes Fantasia, RN Outcome: Progressing 11/03/2022 2221 by Mercedes Adams, Mercedes Fillers, RN Outcome: Progressing Goal: Respiratory complications will improve 11/03/2022 2221 by Mercedes Adams, Mercedes Fillers, RN Outcome: Progressing 11/03/2022 2221 by Mercedes Adams, Mercedes Fillers, RN Outcome: Progressing Goal: Cardiovascular complication will be avoided 11/03/2022 2221 by Mercedes Fantasia, RN Outcome: Progressing 11/03/2022 2221 by Mercedes Adams, Mercedes Fillers, RN Outcome: Progressing   Problem: Activity: Goal: Risk for activity intolerance will decrease 11/03/2022 2221 by Mercedes Adams, Mercedes Fillers, RN Outcome: Progressing 11/03/2022 2221 by Mercedes Adams, Mercedes Fillers, RN Outcome: Progressing   Problem: Nutrition: Goal: Adequate nutrition will be maintained 11/03/2022 2221 by Mercedes Adams, Mercedes Fillers, RN Outcome: Progressing 11/03/2022 2221 by Mercedes Adams, Mercedes Fillers, RN Outcome: Progressing   Problem: Coping: Goal: Level of anxiety will decrease 11/03/2022 2221 by Mercedes Adams, Mercedes Fillers, RN Outcome: Progressing 11/03/2022 2221 by Mercedes Adams, Mercedes Fillers, RN Outcome: Progressing   Problem: Elimination: Goal: Will not experience complications related to bowel motility 11/03/2022 2221 by Mercedes Fantasia, RN Outcome: Progressing 11/03/2022 2221 by Mercedes Adams, Mercedes Fillers, RN Outcome: Progressing Goal: Will not experience complications related to urinary retention 11/03/2022 2221 by Mercedes Fantasia, RN Outcome: Progressing 11/03/2022 2221 by Mercedes Adams, Mercedes Fillers, RN Outcome: Progressing    Problem: Pain Managment: Goal: General experience of comfort will improve 11/03/2022 2221 by Mercedes Adams, Mercedes Fillers, RN Outcome: Progressing 11/03/2022 2221 by Mercedes Adams, Mercedes Fillers, RN Outcome: Progressing   Problem: Safety: Goal: Ability to remain free from injury will improve 11/03/2022 2221 by Mercedes Fantasia, RN Outcome: Progressing 11/03/2022 2221 by Mercedes Adams, Mercedes Fillers, RN Outcome: Progressing  Problem: Skin Integrity: Goal: Risk for impaired skin integrity will decrease 11/03/2022 2221 by Mercedes Adams, Mercedes Fillers, RN Outcome: Progressing 11/03/2022 2221 by Mercedes Adams, Mercedes Fillers, RN Outcome: Progressing   Problem: Education: Goal: Ability to describe self-care measures that may prevent or decrease complications (Diabetes Survival Skills Education) will improve 11/03/2022 2221 by Mercedes Adams, Mercedes Fillers, RN Outcome: Progressing 11/03/2022 2221 by Mercedes Adams, Mercedes Fillers, RN Outcome: Progressing Goal: Individualized Educational Video(s) 11/03/2022 2221 by Mercedes Adams, Mercedes Fillers, RN Outcome: Progressing 11/03/2022 2221 by Mercedes Adams, Mercedes Fillers, RN Outcome: Progressing   Problem: Coping: Goal: Ability to adjust to condition or change in health will improve 11/03/2022 2221 by Mercedes Adams, Mercedes Fillers, RN Outcome: Progressing 11/03/2022 2221 by Mercedes Adams, Mercedes Fillers, RN Outcome: Progressing   Problem: Fluid Volume: Goal: Ability to maintain a balanced intake and output will improve 11/03/2022 2221 by Mercedes Adams, Mercedes Fillers, RN Outcome: Progressing 11/03/2022 2221 by Mercedes Adams, Mercedes Fillers, RN Outcome: Progressing   Problem: Health Behavior/Discharge Planning: Goal: Ability to identify and utilize available resources and services will improve 11/03/2022 2221 by Mercedes Adams, Mercedes Fillers, RN Outcome: Progressing 11/03/2022 2221 by Mercedes Adams, Mercedes Fillers, RN Outcome: Progressing Goal: Ability to manage  health-related needs will improve 11/03/2022 2221 by Mercedes Fantasia, RN Outcome: Progressing 11/03/2022 2221 by Mercedes Adams, Mercedes Fillers, RN Outcome: Progressing   Problem: Metabolic: Goal: Ability to maintain appropriate glucose levels will improve 11/03/2022 2221 by Mercedes Adams, Mercedes Fillers, RN Outcome: Progressing 11/03/2022 2221 by Mercedes Adams, Mercedes Fillers, RN Outcome: Progressing   Problem: Nutritional: Goal: Maintenance of adequate nutrition will improve 11/03/2022 2221 by Mercedes Fantasia, RN Outcome: Progressing 11/03/2022 2221 by Mercedes Adams, Mercedes Fillers, RN Outcome: Progressing Goal: Progress toward achieving an optimal weight will improve 11/03/2022 2221 by Mercedes Adams, Mercedes Fillers, RN Outcome: Progressing 11/03/2022 2221 by Mercedes Adams, Mercedes Fillers, RN Outcome: Progressing   Problem: Skin Integrity: Goal: Risk for impaired skin integrity will decrease 11/03/2022 2221 by Mercedes Adams, Mercedes Fillers, RN Outcome: Progressing 11/03/2022 2221 by Mercedes Adams, Mercedes Fillers, RN Outcome: Progressing   Problem: Tissue Perfusion: Goal: Adequacy of tissue perfusion will improve 11/03/2022 2221 by Mercedes Fantasia, RN Outcome: Progressing 11/03/2022 2221 by Mercedes Adams, Mercedes Fillers, RN Outcome: Progressing   Problem: Education: Goal: Understanding of CV disease, CV risk reduction, and recovery process will improve 11/03/2022 2221 by Mercedes Fantasia, RN Outcome: Progressing 11/03/2022 2221 by Mercedes Adams, Mercedes Fillers, RN Outcome: Progressing Goal: Individualized Educational Video(s) 11/03/2022 2221 by Mercedes Adams, Mercedes Fillers, RN Outcome: Progressing 11/03/2022 2221 by Mercedes Adams, Mercedes Fillers, RN Outcome: Progressing   Problem: Activity: Goal: Ability to return to baseline activity level will improve 11/03/2022 2221 by Mercedes Adams, Mercedes Fillers, RN Outcome: Progressing 11/03/2022 2221 by Mercedes Adams, Mercedes Fillers,  RN Outcome: Progressing   Problem: Cardiovascular: Goal: Ability to achieve and maintain adequate cardiovascular perfusion will improve 11/03/2022 2221 by Mercedes Adams, Mercedes Fillers, RN Outcome: Progressing 11/03/2022 2221 by Mercedes Adams, Mercedes Fillers, RN Outcome: Progressing Goal: Vascular access site(s) Level 0-1 will be maintained 11/03/2022 2221 by Mercedes Adams, Mercedes Fillers, RN Outcome: Progressing 11/03/2022 2221 by Mercedes Adams, Mercedes Fillers, RN Outcome: Progressing   Problem: Health Behavior/Discharge Planning: Goal: Ability to safely manage health-related needs after discharge will improve 11/03/2022 2221 by Mercedes Fantasia, RN Outcome: Progressing 11/03/2022 2221 by Mercedes Adams, Mercedes Fillers, RN Outcome: Progressing

## 2022-11-03 NOTE — Progress Notes (Signed)
Progress Note  Patient Name: Mercedes Adams Date of Encounter: 11/03/2022  Attending physician: Mercedes Kras, DO Primary care provider: Isaias Sakai, DO  Subjective: Mercedes Adams is a 66 y.o. female who was seen and examined at bedside  Mercedes Adams, her sister present at bedside. Denies anginal discomfort or heart failure symptoms. Continues to have melanotic stools Constipation resolved. Hemoglobin remains relatively stable Case discussed and reviewed with her nurse.  Objective: Vital Signs in the last 24 hours: Temp:  [98.1 F (36.7 C)-98.8 F (37.1 C)] 98.7 F (37.1 C) (11/04 1130) Pulse Rate:  [66-92] 75 (11/04 1400) Resp:  [8-26] 18 (11/04 1400) BP: (92-125)/(53-73) 94/59 (11/04 1400) SpO2:  [90 %-100 %] 96 % (11/04 1400) Weight:  [87.8 kg] 87.8 kg (11/04 0500)  Intake/Output:  Intake/Output Summary (Last 24 hours) at 11/03/2022 1507 Last data filed at 11/03/2022 0800 Gross per 24 hour  Intake 730.47 ml  Output 700 ml  Net 30.47 ml     Net IO Since Admission: 3,082.37 mL [11/03/22 1507]  Weights:  Filed Weights   11/01/22 1023 11/02/22 0500 11/03/22 0500  Weight: 83.9 kg 85.7 kg 87.8 kg    Telemetry: Personally reviewed. NSR / ST   Physical examination:  Temp:  [98.1 F (36.7 C)-98.8 F (37.1 C)] 98.7 F (37.1 C) (11/04 1130) Pulse Rate:  [66-92] 75 (11/04 1400) Cardiac Rhythm: Normal sinus rhythm (11/04 0800) Resp:  [8-26] 18 (11/04 1400) BP: (92-125)/(53-73) 94/59 (11/04 1400) SpO2:  [90 %-100 %] 96 % (11/04 1400) Weight:  [87.8 kg] 87.8 kg (11/04 0500)   PHYSICAL EXAM:    11/03/2022    2:00 PM 11/03/2022    1:00 PM 11/03/2022   12:00 PM  Vitals with BMI  Systolic 94 818 99  Diastolic 59 61 59  Pulse 75 79 77   Physical Exam  Constitutional: No distress.  Age appropriate, hemodynamically stable.   Neck: No JVD present.  Cardiovascular: Normal rate, regular rhythm, S1 normal, S2 normal, intact distal pulses and normal pulses. Exam  reveals no gallop, no S3 and no S4.  No murmur heard. Pulses:      Dorsalis pedis pulses are 2+ on the right side and 2+ on the left side.       Posterior tibial pulses are 2+ on the right side and 2+ on the left side.  Pulmonary/Chest: Effort normal and breath sounds normal. No stridor. She has no wheezes. She has no rales.  Abdominal: Soft. Bowel sounds are normal. She exhibits no distension. There is no abdominal tenderness.  Musculoskeletal:        General: No edema.     Cervical back: Neck supple.  Neurological: She is alert and oriented to person, place, and time. She has intact cranial nerves (2-12).  Skin: Skin is warm and moist.   Lab Results: Hematology Recent Labs  Lab 11/01/22 2225 11/02/22 0758 11/02/22 1840 11/03/22 0634  WBC 13.1* 11.5*  --  10.0  RBC 2.17* 3.05*  --  2.98*  HGB 6.4* 9.1* 8.5* 8.7*  HCT 19.7* 26.7* 25.3* 26.5*  MCV 90.8 87.5  --  88.9  MCH 29.5 29.8  --  29.2  MCHC 32.5 34.1  --  32.8  RDW 14.9 14.9  --  15.4  PLT 241 224  --  223     Chemistry Recent Labs  Lab 11/01/22 1029 11/02/22 0758 11/03/22 0634  NA 138 135 139  K 3.9 3.8 3.7  CL 105 106 105  CO2  21* 22 23  GLUCOSE 266* 118* 106*  BUN '13 16 8  '$ CREATININE 0.88 0.77 0.76  CALCIUM 9.0 7.9* 8.3*  PROT  --  5.5*  --   ALBUMIN  --  2.7*  --   AST  --  26  --   ALT  --  17  --   ALKPHOS  --  53  --   BILITOT  --  1.7*  --   GFRNONAA >60 >60 >60  ANIONGAP '12 7 11      '$ Cardiac Enzymes: Cardiac Panel (last 3 results) Recent Labs    11/01/22 1029 11/01/22 1143  TROPONINIHS 2,065* 2,487*     BNP (last 3 results) Recent Labs    11/01/22 1029  BNP 283.4*     ProBNP (last 3 results) No results for input(s): "PROBNP" in the last 8760 hours.   DDimer No results for input(s): "DDIMER" in the last 168 hours.   Hemoglobin A1c:  Lab Results  Component Value Date   HGBA1C 8.0 (H) 11/01/2022   MPG 182.9 11/01/2022    TSH  Recent Labs    11/01/22 1028  TSH  8.243*     Lipid Panel     Component Value Date/Time   CHOL 137 11/02/2022 0758   TRIG 179 (H) 11/02/2022 0758   HDL 31 (L) 11/02/2022 0758   CHOLHDL 4.4 11/02/2022 0758   VLDL 36 11/02/2022 0758   LDLCALC 70 11/02/2022 0758   LDLDIRECT 79 11/02/2022 0758    Imaging: ECHOCARDIOGRAM COMPLETE  Result Date: 11/02/2022    ECHOCARDIOGRAM REPORT   Patient Name:   Mercedes Adams Date of Exam: 11/02/2022 Medical Rec #:  [id number]    Height:       64.0 in Accession #:    [id number]   Weight:       188.9 lb Date of Birth:  [birth year]    BSA:          1.910 m Patient Age:    78 years     BP:           105/55 mmHg Patient Gender: F            HR:           86 bpm. Exam Location:  Inpatient Procedure: 2D Echo and Intracardiac Opacification Agent Indications:    NSTEMI  History:        Patient has no prior history of Echocardiogram examinations.                 CAD; Risk Factors:Hypertension, Diabetes and Dyslipidemia.  Sonographer:    Harvie Junior Referring Phys: [id number] Mercedes Adams  Sonographer Comments: Technically difficult study due to poor echo windows. IMPRESSIONS  1. Definity was used for evaluation of wallmotion and EF estimation. Left ventricular ejection fraction, by estimation, is 50 to 55%. Left ventricular ejection fraction by 2D MOD biplane is 54.7 %. The left ventricle has low normal function. The left ventricle demonstrates regional wall motion abnormalities (see scoring diagram/findings for description). There is mild left ventricular hypertrophy. Left ventricular diastolic parameters are consistent with Grade II diastolic dysfunction (pseudonormalization). Elevated left ventricular end-diastolic pressure. There is mild hypokinesis of the left ventricular, entire inferior wall.  2. Right ventricular systolic function is normal. The right ventricular size is normal. There is normal pulmonary artery systolic pressure. The estimated right ventricular systolic pressure is 35.4 mmHg.  3. Left atrial size  was mildly dilated.  4. The mitral valve is  normal in structure. Mild to moderate mitral valve regurgitation.  5. The aortic valve was not well visualized. Aortic valve regurgitation is not visualized. Aortic valve sclerosis/calcification is present, without any evidence of aortic stenosis.  6. The inferior vena cava is normal in size with greater than 50% respiratory variability, suggesting right atrial pressure of 3 mmHg. FINDINGS  Left Ventricle: Definity was used for evaluation of wallmotion and EF estimation. Left ventricular ejection fraction, by estimation, is 50 to 55%. Left ventricular ejection fraction by 2D MOD biplane is 54.7 %. The left ventricle has low normal function. The left ventricle demonstrates regional wall motion abnormalities. Mild hypokinesis of the left ventricular, entire inferior wall. Definity contrast agent was given IV to delineate the left ventricular endocardial borders. The left ventricular  internal cavity size was small. There is mild left ventricular hypertrophy. Left ventricular diastolic parameters are consistent with Grade II diastolic dysfunction (pseudonormalization). Elevated left ventricular end-diastolic pressure. Right Ventricle: The right ventricular size is normal. No increase in right ventricular wall thickness. Right ventricular systolic function is normal. There is normal pulmonary artery systolic pressure. The tricuspid regurgitant velocity is 2.80 m/s, and  with an assumed right atrial pressure of 3 mmHg, the estimated right ventricular systolic pressure is 81.2 mmHg. Left Atrium: Left atrial size was mildly dilated. Right Atrium: Right atrial size was normal in size. Pericardium: There is no evidence of pericardial effusion. Mitral Valve: The mitral valve is normal in structure. Mild to moderate mitral valve regurgitation. Tricuspid Valve: The tricuspid valve is normal in structure. Tricuspid valve regurgitation is mild. Aortic Valve: The aortic valve was not  well visualized. Aortic valve regurgitation is not visualized. Aortic valve sclerosis/calcification is present, without any evidence of aortic stenosis. Aortic valve mean gradient measures 5.0 mmHg. Aortic valve peak gradient measures 9.6 mmHg. Aortic valve area, by VTI measures 2.24 cm. Pulmonic Valve: The pulmonic valve was normal in structure. Pulmonic valve regurgitation is not visualized. No evidence of pulmonic stenosis. Aorta: The aortic root is normal in size and structure. Venous: The inferior vena cava is normal in size with greater than 50% respiratory variability, suggesting right atrial pressure of 3 mmHg. IAS/Shunts: No atrial level shunt detected by color flow Doppler.  LEFT VENTRICLE PLAX 2D                        Biplane EF (MOD) LVIDd:         3.50 cm         LV Biplane EF:   Left LVIDs:         2.90 cm                          ventricular LV PW:         1.20 cm                          ejection LV IVS:        1.30 cm                          fraction by LVOT diam:     1.80 cm                          2D MOD LV SV:         61  biplane is LV SV Index:   32                               54.7 %. LVOT Area:     2.54 cm                                Diastology                                LV e' medial:    5.33 cm/s LV Volumes (MOD)               LV E/e' medial:  17.4 LV vol d, MOD    55.7 ml       LV e' lateral:   8.81 cm/s A2C:                           LV E/e' lateral: 10.5 LV vol d, MOD    88.6 ml A4C: LV vol s, MOD    31.4 ml A2C: LV vol s, MOD    33.4 ml A4C: LV SV MOD A2C:   24.3 ml LV SV MOD A4C:   88.6 ml LV SV MOD BP:    40.7 ml RIGHT VENTRICLE RV Basal diam:  3.30 cm RV Mid diam:    2.50 cm RV S prime:     15.30 cm/s TAPSE (M-mode): 2.3 cm LEFT ATRIUM             Index        RIGHT ATRIUM           Index LA diam:        3.40 cm 1.78 cm/m   RA Area:     10.40 cm LA Vol (A2C):   48.2 ml 25.24 ml/m  RA Volume:   22.00 ml  11.52 ml/m LA Vol (A4C):   50.1 ml  26.24 ml/m LA Biplane Vol: 50.1 ml 26.24 ml/m  AORTIC VALVE                     PULMONIC VALVE AV Area (Vmax):    2.10 cm      PV Vmax:       1.10 m/s AV Area (Vmean):   2.27 cm      PV Peak grad:  4.8 mmHg AV Area (VTI):     2.24 cm AV Vmax:           155.00 cm/s AV Vmean:          110.000 cm/s AV VTI:            0.274 m AV Peak Grad:      9.6 mmHg AV Mean Grad:      5.0 mmHg LVOT Vmax:         128.00 cm/s LVOT Vmean:        98.300 cm/s LVOT VTI:          0.241 m LVOT/AV VTI ratio: 0.88  AORTA Ao Root diam: 3.20 cm Ao Asc diam:  3.20 cm MITRAL VALVE               TRICUSPID VALVE MV Area (PHT): 3.61 cm    TR Peak grad:   31.4 mmHg MV Decel Time: 210 msec  TR Vmax:        280.00 cm/s MR Peak grad: 83.7 mmHg MR Vmax:      457.33 cm/s  SHUNTS MV E velocity: 92.50 cm/s  Systemic VTI:  0.24 m MV A velocity: 80.60 cm/s  Systemic Diam: 1.80 cm MV E/A ratio:  1.15 Mercedes Adams by Mercedes Adams Signature Date/Time: 11/02/2022/10:10:39 AM    Final     CARDIAC DATABASE: EKG: 11/01/2022: 9:42 AM: Sinus tachycardia, 108 bpm, diffuse ST depressions. 1018: Sinus tachycardia, 110 bpm, diffuse ST depressions concerning for multivessel CAD.  Echocardiogram: 11/02/2022 1. Definity was used for evaluation of wallmotion and EF estimation. Left  ventricular ejection fraction, by estimation, is 50 to 55%. Left  ventricular ejection fraction by 2D MOD biplane is 54.7 %. The left  ventricle has low normal function. The left  ventricle demonstrates regional wall motion abnormalities (see scoring  diagram/findings for description). There is mild left ventricular  hypertrophy. Left ventricular diastolic parameters are consistent with  Grade II diastolic dysfunction  (pseudonormalization). Elevated left ventricular end-diastolic pressure.  There is mild hypokinesis of the left ventricular, entire inferior wall.   2. Right ventricular systolic function is normal. The right ventricular  size is  normal. There is normal pulmonary artery systolic pressure. The  estimated right ventricular systolic pressure is 83.1 mmHg.   3. Left atrial size was mildly dilated.   4. The mitral valve is normal in structure. Mild to moderate mitral valve  regurgitation.   5. The aortic valve was not well visualized. Aortic valve regurgitation  is not visualized. Aortic valve sclerosis/calcification is present,  without any evidence of aortic stenosis.   6. The inferior vena cava is normal in size with greater than 50%  respiratory variability, suggesting right atrial pressure of 3 mmHg.   Stress test: None  Heart catheterization: Left Heart Catheterization 11/01/22:  LV: 70/0, EDP 11 mmHg.  Aortic pressure 72/44, mean 58 mmHg.  Patient received 500 cc bolus with immediate normalization of blood pressure, 90/66 mmHg. LVEF moderate to severely reduced at 30 to 35%, inferior and septal severe hypokinesis and global hypokinesis. LM: Large vessel, mildly calcified. LAD: Large vessel, gives origin to a moderate-sized D2 with ostial 90% stenosis.  Proximal to D2 there is a focal 90 to 95% stenosis in the LAD.  Mid segment has a 80% stenosis and is diffusely diseased in the mid to distal segment all the way to the apex and moderately diffusely calcified. LCx: Moderate to large caliber vessel, again mild amount of calcification noted throughout the vessel.  Proximal segment has a focal 80% stenosis. RI: Moderate caliber vessel.  Mildly tortuous in the midsegment.  Mild disease. RCA: Very large caliber vessel.  Occluded in the mid segment of very small RV branch.  It has contralateral collaterals from the LAD and circumflex.  There appears to be microchannel's with filling of the RCA all the way to very large PDA and PL branches.  PL has about a 60% proximal stenosis and PDA has about a 30 to 40% stenosis.  Impression: Severe multivessel coronary artery disease.  Moderate to severely reduced LVEF, EF anywhere from  30 to 35%, would ideally be a CABG candidate especially in view of GI bleed and difficulty in dual antiplatelet therapy long-term.  However for now we will manage her medically, obtain the CTS consult electively, do not think that the LAD is bypassable in view of severe disease.  Large RCA with large PDA and  PL branch, fairly moderate-sized D2 and large CX are bypassable.  Scheduled Meds:  sodium chloride   Intravenous Once   aspirin EC  81 mg Oral Daily   atorvastatin  80 mg Oral QHS   Chlorhexidine Gluconate Cloth  6 each Topical Daily   docusate sodium  100 mg Oral BID   empagliflozin  25 mg Oral Daily   folic acid  1 mg Oral Daily   furosemide  10 mg Intravenous Once   heparin  5,000 Units Subcutaneous Q8H   insulin aspart  0-15 Units Subcutaneous Q4H   isosorbide mononitrate  30 mg Oral Daily   levothyroxine  100 mcg Oral Q0600   metoprolol tartrate  25 mg Oral TID   multivitamin with minerals  1 tablet Oral Daily   [START ON 11/05/2022] pantoprazole  40 mg Intravenous Q12H   senna  1 tablet Oral Daily   sodium chloride flush  3 mL Intravenous Q12H    Continuous Infusions:  sodium chloride 500 mL (11/03/22 1432)   sodium chloride     pantoprazole 8 mg/hr (11/03/22 0213)    PRN Meds: sodium chloride, acetaminophen, alum & mag hydroxide-simeth, nitroGLYCERIN, ondansetron (ZOFRAN) IV, sodium chloride flush   IMPRESSION & RECOMMENDATIONS: Mercedes Adams is a 66 y.o. Caucasian female whose past medical history and cardiac risk factors include: Multivessel CAD, Upper GIB due to duodenal ulcer s/p epi/clips/PureStat, Hypertension, hyperlipidemia, non-insulin-dependent diabetes mellitus type 2, postmenopausal female, advanced age, obesity.   Impression:  NSTEMI Multivessel coronary disease without active anginal discomfort Newly discovered HFpEF Acute Upper GI Bleed 2/2 Duodenal Ulcer s/p epi/clips/PureStat. Acute Blood Loss Anemia due to duodenal ulcer s/p PRBCs NIDDM Type  II Hypertension HLD Obesity due to excess calories.   Recommendations: NSTEMI / CAD:  Presented to the ED with anginal discomfort, EKG changes suggestive of multivessel CAD.  NSTEMI likely precipitated by GIB  Underwent left heart catheterization and was noted to have multivessel CAD. Hemoglobin on arrival 8.5 mg/dL when compared to hemoglobin at 13.8 as of 09/05/2022 with PCP.   Antianginal therapy includes metoprolol, Imdur. 2 units of PRBC since admission due to acute blood loss anemia. Underwent EGD was noted to have duodenal ulcer status post epinephrine/clips.  Dr. Lyndel Safe spoke to Dr. Einar Gip after the procedure and recommends avoiding antiplatelets for approximately 2 weeks if possible. We will arrange cardiothoracic surgery consult for evaluation of CABG candidacy and timing of surgery given the acute upper GI bleed. Will continue medical therapy.   Acute GIB:  Stable Secondary to Duodenal ulcer w/ visible vessel s/p epi/clips/PureStat. Give Protonix (pantoprazole): 8 mg/hr IV by continuous infusion for 72hrs, then Protonix 40BID x 8 weeks, then QD Avoid nonsteroidals Trend H&H. Follow Bx for [facility] GI Adams off today.  Acute Blood loss anemia:  See above  NIDDM Type II:  A1c 8  Increased ISS to improve glycemic control Holding ARB/ACEi due to hypotension Continue Jardiance  Continue Statins Will add Fenofibrate to help augment her TAGs.   HTN:  Currently borderline due to GI bleeding  Imdur and Metoprolol w/ holding parameters.  Reduce Imdur to '15mg'$  po qday.   HLD:  Continue statins.   Spoke to patient, sister, at bedside their questions and concerns addressed.  RN updated w/ plan of care as well.  CT surgery consult reviewed - final recommendation pending.   Patient is informed that if CT surgery recommends bypass in the coming weeks due to the recent GI Bleeding would likely discharge her after completing  IV Protonix for 72hr as recommended by GI.   Patient's  questions and concerns were addressed to her satisfaction. She voices understanding of the instructions provided during this encounter.   This note was created using a voice recognition software as a result there may be grammatical errors inadvertently enclosed that do not reflect the nature of this encounter. Every attempt is made to correct such errors.  Total time spent: 35 minutes.   Mechele Claude [facility]  Pager: [phone] Office: [phone] 11/03/2022, 3:07 PM

## 2022-11-03 NOTE — Plan of Care (Signed)
  Problem: Education: Goal: Understanding of cardiac disease, CV risk reduction, and recovery process will improve Outcome: Progressing Goal: Individualized Educational Video(s) Outcome: Progressing   Problem: Activity: Goal: Ability to tolerate increased activity will improve Outcome: Progressing   Problem: Cardiac: Goal: Ability to achieve and maintain adequate cardiovascular perfusion will improve Outcome: Progressing   Problem: Health Behavior/Discharge Planning: Goal: Ability to safely manage health-related needs after discharge will improve Outcome: Progressing   Problem: Education: Goal: Knowledge of General Education information will improve Description: Including pain rating scale, medication(s)/side effects and non-pharmacologic comfort measures Outcome: Progressing   Problem: Health Behavior/Discharge Planning: Goal: Ability to manage health-related needs will improve Outcome: Progressing   Problem: Clinical Measurements: Goal: Ability to maintain clinical measurements within normal limits will improve Outcome: Progressing Goal: Will remain free from infection Outcome: Progressing Goal: Diagnostic test results will improve Outcome: Progressing Goal: Respiratory complications will improve Outcome: Progressing Goal: Cardiovascular complication will be avoided Outcome: Progressing   Problem: Activity: Goal: Risk for activity intolerance will decrease Outcome: Progressing   Problem: Nutrition: Goal: Adequate nutrition will be maintained Outcome: Progressing   Problem: Coping: Goal: Level of anxiety will decrease Outcome: Progressing   Problem: Elimination: Goal: Will not experience complications related to bowel motility Outcome: Progressing Goal: Will not experience complications related to urinary retention Outcome: Progressing   Problem: Pain Managment: Goal: General experience of comfort will improve Outcome: Progressing   Problem:  Safety: Goal: Ability to remain free from injury will improve Outcome: Progressing   Problem: Skin Integrity: Goal: Risk for impaired skin integrity will decrease Outcome: Progressing   Problem: Education: Goal: Ability to describe self-care measures that may prevent or decrease complications (Diabetes Survival Skills Education) will improve Outcome: Progressing Goal: Individualized Educational Video(s) Outcome: Progressing   Problem: Coping: Goal: Ability to adjust to condition or change in health will improve Outcome: Progressing   Problem: Fluid Volume: Goal: Ability to maintain a balanced intake and output will improve Outcome: Progressing   Problem: Health Behavior/Discharge Planning: Goal: Ability to identify and utilize available resources and services will improve Outcome: Progressing Goal: Ability to manage health-related needs will improve Outcome: Progressing   Problem: Metabolic: Goal: Ability to maintain appropriate glucose levels will improve Outcome: Progressing   Problem: Nutritional: Goal: Maintenance of adequate nutrition will improve Outcome: Progressing Goal: Progress toward achieving an optimal weight will improve Outcome: Progressing   Problem: Skin Integrity: Goal: Risk for impaired skin integrity will decrease Outcome: Progressing   Problem: Tissue Perfusion: Goal: Adequacy of tissue perfusion will improve Outcome: Progressing   Problem: Education: Goal: Understanding of CV disease, CV risk reduction, and recovery process will improve Outcome: Progressing Goal: Individualized Educational Video(s) Outcome: Progressing   Problem: Activity: Goal: Ability to return to baseline activity level will improve Outcome: Progressing   Problem: Cardiovascular: Goal: Ability to achieve and maintain adequate cardiovascular perfusion will improve Outcome: Progressing Goal: Vascular access site(s) Level 0-1 will be maintained Outcome: Progressing    Problem: Health Behavior/Discharge Planning: Goal: Ability to safely manage health-related needs after discharge will improve Outcome: Progressing

## 2022-11-03 NOTE — TOC Initial Note (Signed)
Transition of Care [facility]) - Initial/Assessment Note    Patient Details  Name: Mercedes Adams MRN: [id number] Date of Birth: April 21, 1956  Transition of Care [facility]) CM/SW Contact:    Bethena Roys, RN Phone Number: 11/03/2022, 8:54 AM  Clinical Narrative: Case Manager received a consult for home needs and that patient is the caregiver for her spouse. Case Manager spoke with the patient regarding disposition needs and she wants to see if insurance will pay for CIR vs SNF for rehab prior to returning home. Patient states neighbors are checking in on her spouse at this time. Case Manager asked Staff RN to place orders for PT/OT for recommendations and then we can proceed with disposition plan. Case Manager will continue to follow for additional transition of care needs as the patient progresses.                   Expected Discharge Plan: IP Rehab Facility Barriers to Discharge: Continued Medical Work up   Patient Goals and CMS Choice Patient states their goals for this hospitalization and ongoing recovery are:: Patient wants to go to CIR vs SNF for rehab to ge stronger.      Expected Discharge Plan and Services Expected Discharge Plan: [place]   Discharge Planning Services: CM Consult   Living arrangements for the past 2 months: Single Family Home   Prior Living Arrangements/Services Living arrangements for the past 2 months: Single Family Home Lives with:: Spouse Patient language and need for interpreter reviewed:: Yes        Need for Family Participation in Patient Care: Yes (Comment) Care giver support system in place?: Yes (comment)   Criminal Activity/Legal Involvement Pertinent to Current Situation/Hospitalization: No - Comment as needed  Activities of Daily Living Home Assistive Devices/Equipment: None ADL Screening (condition at time of admission) Is the patient deaf or have difficulty hearing?: Yes Does the patient have difficulty seeing, even when  wearing glasses/contacts?: No Does the patient have difficulty concentrating, remembering, or making decisions?: Yes Patient able to express need for assistance with ADLs?: Yes Does the patient have difficulty dressing or bathing?: No Independently performs ADLs?: Yes (appropriate for developmental age) Does the patient have difficulty walking or climbing stairs?: Yes (climbing stairs) Weakness of Legs: Right Weakness of Arms/Hands: None  Permission Sought/Granted Permission sought to share information with : Case Manager, Family Supports    Emotional Assessment Appearance:: Appears stated age Attitude/Demeanor/Rapport: Engaged Affect (typically observed): Appropriate Orientation: : Oriented to Self, Oriented to Place, Oriented to  Time, Oriented to Situation Alcohol / Substance Use: Not Applicable Psych Involvement: No (comment)  Admission diagnosis:  NSTEMI (non-ST elevated myocardial infarction) ([place]) [I21.4] Patient Active Problem List   Diagnosis Date Noted   Acute GI bleeding 11/02/2022   Duodenal ulcer 11/02/2022   Acute heart failure with preserved ejection fraction (HFpEF) ([place]) 11/02/2022   Acute blood loss anemia 11/02/2022   Class 1 obesity due to excess calories with serious comorbidity and body mass index (BMI) of 32.0 to 32.9 in adult 11/02/2022   NSTEMI (non-ST elevated myocardial infarction) ([place]) 11/01/2022   DM (diabetes mellitus) ([place]) 11/01/2022   Benign hypertension 11/01/2022   Mixed hyperlipidemia 11/01/2022   Coronary artery disease involving native coronary artery of native heart without angina pectoris 11/01/2022   PCP:  Isaias Sakai, DO Pharmacy:   [facility] [phone] - [place], [address] AT [place] [address] [place] [address] [place]  [place] [address] Phone: [phone] Fax: [phone]   Readmission Risk Interventions     No data to display

## 2022-11-04 ENCOUNTER — Encounter (HOSPITAL_COMMUNITY): Payer: Self-pay | Admitting: Gastroenterology

## 2022-11-04 DIAGNOSIS — I214 Non-ST elevation (NSTEMI) myocardial infarction: Secondary | ICD-10-CM

## 2022-11-04 DIAGNOSIS — I2510 Atherosclerotic heart disease of native coronary artery without angina pectoris: Secondary | ICD-10-CM

## 2022-11-04 LAB — CBC WITH DIFFERENTIAL/PLATELET
Abs Immature Granulocytes: 0.03 10*3/uL (ref 0.00–0.07)
Abs Immature Granulocytes: 0.04 10*3/uL (ref 0.00–0.07)
Basophils Absolute: 0.1 10*3/uL (ref 0.0–0.1)
Basophils Absolute: 0.1 10*3/uL (ref 0.0–0.1)
Basophils Relative: 1 %
Basophils Relative: 1 %
Eosinophils Absolute: 0.3 10*3/uL (ref 0.0–0.5)
Eosinophils Absolute: 0.3 10*3/uL (ref 0.0–0.5)
Eosinophils Relative: 3 %
Eosinophils Relative: 3 %
HCT: 26.2 % — ABNORMAL LOW (ref 36.0–46.0)
HCT: 27.7 % — ABNORMAL LOW (ref 36.0–46.0)
Hemoglobin: 8.3 g/dL — ABNORMAL LOW (ref 12.0–15.0)
Hemoglobin: 8.9 g/dL — ABNORMAL LOW (ref 12.0–15.0)
Immature Granulocytes: 0 %
Immature Granulocytes: 0 %
Lymphocytes Relative: 20 %
Lymphocytes Relative: 22 %
Lymphs Abs: 1.9 10*3/uL (ref 0.7–4.0)
Lymphs Abs: 2.3 10*3/uL (ref 0.7–4.0)
MCH: 29 pg (ref 26.0–34.0)
MCH: 29.4 pg (ref 26.0–34.0)
MCHC: 31.7 g/dL (ref 30.0–36.0)
MCHC: 32.1 g/dL (ref 30.0–36.0)
MCV: 91.6 fL (ref 80.0–100.0)
MCV: 91.4 fL (ref 80.0–100.0)
Monocytes Absolute: 0.6 10*3/uL (ref 0.1–1.0)
Monocytes Absolute: 0.7 10*3/uL (ref 0.1–1.0)
Monocytes Relative: 6 %
Monocytes Relative: 7 %
Neutro Abs: 6.9 10*3/uL (ref 1.7–7.7)
Neutro Abs: 6.9 10*3/uL (ref 1.7–7.7)
Neutrophils Relative %: 70 %
Neutrophils Relative %: 67 %
Platelets: 219 10*3/uL (ref 150–400)
Platelets: 247 10*3/uL (ref 150–400)
RBC: 2.86 MIL/uL — ABNORMAL LOW (ref 3.87–5.11)
RBC: 3.03 MIL/uL — ABNORMAL LOW (ref 3.87–5.11)
RDW: 15 % (ref 11.5–15.5)
RDW: 15.1 % (ref 11.5–15.5)
WBC: 9.9 10*3/uL (ref 4.0–10.5)
WBC: 10.2 10*3/uL (ref 4.0–10.5)
nRBC: 0 % (ref 0.0–0.2)
nRBC: 0.2 % (ref 0.0–0.2)

## 2022-11-04 LAB — GLUCOSE, CAPILLARY
Glucose-Capillary: 114 mg/dL — ABNORMAL HIGH (ref 70–99)
Glucose-Capillary: 126 mg/dL — ABNORMAL HIGH (ref 70–99)
Glucose-Capillary: 134 mg/dL — ABNORMAL HIGH (ref 70–99)
Glucose-Capillary: 170 mg/dL — ABNORMAL HIGH (ref 70–99)
Glucose-Capillary: 199 mg/dL — ABNORMAL HIGH (ref 70–99)

## 2022-11-04 LAB — BASIC METABOLIC PANEL
Anion gap: 8 (ref 5–15)
BUN: 13 mg/dL (ref 8–23)
CO2: 24 mmol/L (ref 22–32)
Calcium: 8 mg/dL — ABNORMAL LOW (ref 8.9–10.3)
Chloride: 104 mmol/L (ref 98–111)
Creatinine, Ser: 0.79 mg/dL (ref 0.44–1.00)
GFR, Estimated: >60 mL/min (ref >60)
Glucose, Bld: 132 mg/dL — ABNORMAL HIGH (ref 70–99)
Potassium: 4 mmol/L (ref 3.5–5.1)
Sodium: 136 mmol/L (ref 135–145)

## 2022-11-04 MED ORDER — METOPROLOL TARTRATE 12.5 MG HALF TABLET
12.5000 mg | ORAL_TABLET | Freq: Two times a day (BID) | ORAL | Status: DC
  Administered 2022-11-04 – 2022-11-05 (×3): 12.5 mg via ORAL
  Filled 2022-11-04 (×3): qty 1

## 2022-11-04 NOTE — Progress Notes (Signed)
Progress Note  Patient Name: Mercedes Adams Date of Encounter: 11/04/2022  Attending physician: Rex Kras, DO Primary care provider: Isaias Sakai, DO  Subjective: Mercedes Adams is a 66 y.o. female who was seen and examined at bedside  Denies anginal discomfort or heart failure symptoms. No melanotic stools / BM overnight  Hemoglobin stable but slowly down trending Case discussed and reviewed with her nurse and sister Mercedes Adams.  Objective: Vital Signs in the last 24 hours: Temp:  [98.2 F (36.8 C)-98.4 F (36.9 C)] 98.2 F (36.8 C) (11/05 1130) Pulse Rate:  [73-99] 85 (11/05 1000) Resp:  [0-25] 18 (11/05 1000) BP: (87-113)/(47-68) 102/62 (11/05 1000) SpO2:  [90 %-100 %] 97 % (11/05 1000) Weight:  [87.5 kg] 87.5 kg (11/05 0500)  Intake/Output:  Intake/Output Summary (Last 24 hours) at 11/04/2022 1331 Last data filed at 11/04/2022 0900 Gross per 24 hour  Intake 1160.85 ml  Output 300 ml  Net 860.85 ml     Net IO Since Admission: 4,183.22 mL [11/04/22 1331]  Weights:  Filed Weights   11/02/22 0500 11/03/22 0500 11/04/22 0500  Weight: 85.7 kg 87.8 kg 87.5 kg    Telemetry: Personally reviewed. NSR / ST   Physical examination:  Temp:  [98.2 F (36.8 C)-98.4 F (36.9 C)] 98.2 F (36.8 C) (11/05 1130) Pulse Rate:  [73-99] 85 (11/05 1000) Cardiac Rhythm: Normal sinus rhythm (11/05 0800) Resp:  [0-25] 18 (11/05 1000) BP: (87-113)/(47-68) 102/62 (11/05 1000) SpO2:  [90 %-100 %] 97 % (11/05 1000) Weight:  [87.5 kg] 87.5 kg (11/05 0500)   PHYSICAL EXAM:    11/04/2022   10:00 AM 11/04/2022    9:00 AM 11/04/2022    8:00 AM  Vitals with BMI  Systolic 782 956 213  Diastolic 62 62 54  Pulse 85 80 82   Physical Exam  Constitutional: No distress.  Age appropriate, hemodynamically stable.   Neck: No JVD present.  Cardiovascular: Normal rate, regular rhythm, S1 normal, S2 normal, intact distal pulses and normal pulses. Exam reveals no gallop, no S3 and no S4.   No murmur heard. Pulses:      Dorsalis pedis pulses are 2+ on the right side and 2+ on the left side.       Posterior tibial pulses are 2+ on the right side and 2+ on the left side.  Pulmonary/Chest: Effort normal and breath sounds normal. No stridor. She has no wheezes. She has no rales.  Abdominal: Soft. Bowel sounds are normal. She exhibits no distension. There is no abdominal tenderness.  Musculoskeletal:        General: No edema.     Cervical back: Neck supple.  Neurological: She is alert and oriented to person, place, and time. She has intact cranial nerves (2-12).  Skin: Skin is warm and moist.   Lab Results: Hematology Recent Labs  Lab 11/03/22 0634 11/03/22 1557 11/03/22 1558 11/04/22 0606  WBC 10.0  --  10.4 9.9  RBC 2.98*  --  3.07* 2.86*  HGB 8.7* 9.0* 9.0* 8.3*  HCT 26.5* 27.0* 28.0* 26.2*  MCV 88.9  --  91.2 91.6  MCH 29.2  --  29.3 29.0  MCHC 32.8  --  32.1 31.7  RDW 15.4  --  15.3 15.0  PLT 223  --  258 219     Chemistry Recent Labs  Lab 11/02/22 0758 11/03/22 0634 11/04/22 0606  NA 135 139 136  K 3.8 3.7 4.0  CL 106 105 104  CO2 22  23 24  GLUCOSE 118* 106* 132*  BUN '16 8 13  '$ CREATININE 0.77 0.76 0.79  CALCIUM 7.9* 8.3* 8.0*  PROT 5.5*  --   --   ALBUMIN 2.7*  --   --   AST 26  --   --   ALT 17  --   --   ALKPHOS 53  --   --   BILITOT 1.7*  --   --   GFRNONAA >60 >60 >60  ANIONGAP '7 11 8      '$ Cardiac Enzymes: Cardiac Panel (last 3 results) No results for input(s): "CKTOTAL", "CKMB", "TROPONINIHS", "RELINDX" in the last 72 hours.   BNP (last 3 results) Recent Labs    11/01/22 1029  BNP 283.4*     ProBNP (last 3 results) No results for input(s): "PROBNP" in the last 8760 hours.   DDimer No results for input(s): "DDIMER" in the last 168 hours.   Hemoglobin A1c:  Lab Results  Component Value Date   HGBA1C 8.0 (H) 11/01/2022   MPG 182.9 11/01/2022    TSH  Recent Labs    11/01/22 1028  TSH 8.243*     Lipid Panel      Component Value Date/Time   CHOL 137 11/02/2022 0758   TRIG 179 (H) 11/02/2022 0758   HDL 31 (L) 11/02/2022 0758   CHOLHDL 4.4 11/02/2022 0758   VLDL 36 11/02/2022 0758   LDLCALC 70 11/02/2022 0758   LDLDIRECT 79 11/02/2022 0758    Imaging: No results found.  CARDIAC DATABASE: EKG: 11/01/2022: 9:42 AM: Sinus tachycardia, 108 bpm, diffuse ST depressions. 1018: Sinus tachycardia, 110 bpm, diffuse ST depressions concerning for multivessel CAD.  Echocardiogram: 11/02/2022 1. Definity was used for evaluation of wallmotion and EF estimation. Left  ventricular ejection fraction, by estimation, is 50 to 55%. Left  ventricular ejection fraction by 2D MOD biplane is 54.7 %. The left  ventricle has low normal function. The left  ventricle demonstrates regional wall motion abnormalities (see scoring  diagram/findings for description). There is mild left ventricular  hypertrophy. Left ventricular diastolic parameters are consistent with  Grade II diastolic dysfunction  (pseudonormalization). Elevated left ventricular end-diastolic pressure.  There is mild hypokinesis of the left ventricular, entire inferior wall.   2. Right ventricular systolic function is normal. The right ventricular  size is normal. There is normal pulmonary artery systolic pressure. The  estimated right ventricular systolic pressure is 12.8 mmHg.   3. Left atrial size was mildly dilated.   4. The mitral valve is normal in structure. Mild to moderate mitral valve  regurgitation.   5. The aortic valve was not well visualized. Aortic valve regurgitation  is not visualized. Aortic valve sclerosis/calcification is present,  without any evidence of aortic stenosis.   6. The inferior vena cava is normal in size with greater than 50%  respiratory variability, suggesting right atrial pressure of 3 mmHg.   Stress test: None  Heart catheterization: Left Heart Catheterization 11/01/22:  LV: 70/0, EDP 11 mmHg.  Aortic  pressure 72/44, mean 58 mmHg.  Patient received 500 cc bolus with immediate normalization of blood pressure, 90/66 mmHg. LVEF moderate to severely reduced at 30 to 35%, inferior and septal severe hypokinesis and global hypokinesis. LM: Large vessel, mildly calcified. LAD: Large vessel, gives origin to a moderate-sized D2 with ostial 90% stenosis.  Proximal to D2 there is a focal 90 to 95% stenosis in the LAD.  Mid segment has a 80% stenosis and is diffusely diseased in the  mid to distal segment all the way to the apex and moderately diffusely calcified. LCx: Moderate to large caliber vessel, again mild amount of calcification noted throughout the vessel.  Proximal segment has a focal 80% stenosis. RI: Moderate caliber vessel.  Mildly tortuous in the midsegment.  Mild disease. RCA: Very large caliber vessel.  Occluded in the mid segment of very small RV branch.  It has contralateral collaterals from the LAD and circumflex.  There appears to be microchannel's with filling of the RCA all the way to very large PDA and PL branches.  PL has about a 60% proximal stenosis and PDA has about a 30 to 40% stenosis.  Impression: Severe multivessel coronary artery disease.  Moderate to severely reduced LVEF, EF anywhere from 30 to 35%, would ideally be a CABG candidate especially in view of GI bleed and difficulty in dual antiplatelet therapy long-term.  However for now we will manage her medically, obtain the CTS consult electively, do not think that the LAD is bypassable in view of severe disease.  Large RCA with large PDA and PL branch, fairly moderate-sized D2 and large CX are bypassable.  Scheduled Meds:  sodium chloride   Intravenous Once   aspirin EC  81 mg Oral Daily   atorvastatin  80 mg Oral QHS   Chlorhexidine Gluconate Cloth  6 each Topical Daily   docusate sodium  100 mg Oral BID   empagliflozin  25 mg Oral Daily   fenofibrate  160 mg Oral Daily   folic acid  1 mg Oral Daily   furosemide  10 mg  Intravenous Once   heparin  5,000 Units Subcutaneous Q8H   insulin aspart  0-20 Units Subcutaneous TID WC   insulin aspart  0-5 Units Subcutaneous QHS   isosorbide mononitrate  15 mg Oral Daily   levothyroxine  100 mcg Oral Q0600   metoprolol tartrate  25 mg Oral TID   multivitamin with minerals  1 tablet Oral Daily   [START ON 11/05/2022] pantoprazole  40 mg Intravenous Q12H   senna  1 tablet Oral Daily   sodium chloride flush  3 mL Intravenous Q12H    Continuous Infusions:  sodium chloride Stopped (11/03/22 1931)   sodium chloride     pantoprazole 8 mg/hr (11/03/22 2351)    PRN Meds: sodium chloride, acetaminophen, alum & mag hydroxide-simeth, nitroGLYCERIN, ondansetron (ZOFRAN) IV, sodium chloride flush   IMPRESSION & RECOMMENDATIONS: NUSAIBA GUALLPA is a 66 y.o. Caucasian female whose past medical history and cardiac risk factors include: Multivessel CAD, Upper GIB due to duodenal ulcer s/p epi/clips/PureStat, Hypertension, hyperlipidemia, non-insulin-dependent diabetes mellitus type 2, postmenopausal female, advanced age, obesity.   Impression:  NSTEMI Multivessel coronary disease without active anginal discomfort Newly discovered HFpEF Acute Upper GI Bleed 2/2 Duodenal Ulcer s/p epi/clips/PureStat. Acute Blood Loss Anemia due to duodenal ulcer s/p PRBCs NIDDM Type II Hypertension HLD Obesity due to excess calories.   Recommendations: NSTEMI / CAD:  Presented to the ED with anginal discomfort, EKG changes suggestive of multivessel CAD.  NSTEMI likely precipitated by GIB  Underwent left heart catheterization and was noted to have multivessel CAD. Hemoglobin on arrival 8.5 mg/dL when compared to hemoglobin at 13.8 as of 09/05/2022 with PCP.   Will decrease Lopressor 25 mg p.o. 3 times daily to Lopressor 12.5 mg p.o. twice daily with holding parameters. We will discontinue Imdur due to soft blood pressure and no anginal discomfort for now.  2 units of PRBC since admission due  to  acute blood loss anemia. Underwent EGD was noted to have duodenal ulcer status post epinephrine/clips.  Dr. Lyndel Safe spoke to Dr. Einar Gip after the procedure and recommends avoiding antiplatelets for approximately 2 weeks if possible. Dr. Roxan Hockey saw the patient and recommended outpatient evaluation in couple weeks to reevaluate CABG candidacy in the setting of acute GI bleed.  Agree with the plan of care. We will try to arrange the pre-CABG ultrasound/work-up prior to discharge  Will continue medical therapy.   Acute GIB:  Relatively stable -no active bleeding per patient; however, hemoglobin downtrending Due to duodenal ulcer w/ visible vessel s/p epi/clips/PureStat. Remains on IV Protonix for 72 hours as recommended by GI until tonight. Tomorrow Protonix 40 mg p.o. twice daily for 8 weeks and 40 mg daily thereafter Since hemoglobin is downtrending I suspect this may be secondary to subcu heparin. We will discontinue subcu heparin.   SCDs, ambulate the patient w/ assistance, and patient is asked to dorsiflex and plantarflex her feet "pumping motion" Avoid nonsteroidals Trend H&H. GI recommends blood transfusion if hemoglobin less than 8. Follow Bx for [facility] GI signed off 11/03/2022. If patient requires a blood transfusion will have GI reevaluate her in the morning to facilitate a safe discharge/planning  Acute Blood loss anemia:  See above  NIDDM Type II:  A1c 8  Increased ISS to improve glycemic control Holding ARB/ACEi due to hypotension Continue Jardiance  Continue Statins Added fenofibrate to help augment her TAGs.   HTN:  Currently borderline due to GI bleeding  Medication changes as noted above.  HLD:  Continue statins.   We will place transfer orders for 3E as well.   Spoke to patient, sister, at bedside their questions and concerns addressed.  RN updated w/ plan of care as well.   Patient's questions and concerns were addressed to her satisfaction. She voices  understanding of the instructions provided during this encounter.   This note was created using a voice recognition software as a result there may be grammatical errors inadvertently enclosed that do not reflect the nature of this encounter. Every attempt is made to correct such errors.  Total time spent: 35 minutes.   Mechele Claude [facility]  Pager: [phone] Office: [phone] 11/04/2022, 1:31 PM

## 2022-11-04 NOTE — Plan of Care (Signed)
  Problem: Education: Goal: Understanding of cardiac disease, CV risk reduction, and recovery process will improve Outcome: Progressing Goal: Individualized Educational Video(s) Outcome: Progressing   Problem: Activity: Goal: Ability to tolerate increased activity will improve Outcome: Progressing   Problem: Cardiac: Goal: Ability to achieve and maintain adequate cardiovascular perfusion will improve Outcome: Progressing   Problem: Health Behavior/Discharge Planning: Goal: Ability to safely manage health-related needs after discharge will improve Outcome: Progressing   Problem: Education: Goal: Knowledge of General Education information will improve Description: Including pain rating scale, medication(s)/side effects and non-pharmacologic comfort measures Outcome: Progressing   Problem: Health Behavior/Discharge Planning: Goal: Ability to manage health-related needs will improve Outcome: Progressing   Problem: Clinical Measurements: Goal: Ability to maintain clinical measurements within normal limits will improve Outcome: Progressing Goal: Will remain free from infection Outcome: Progressing Goal: Diagnostic test results will improve Outcome: Progressing Goal: Respiratory complications will improve Outcome: Progressing Goal: Cardiovascular complication will be avoided Outcome: Progressing   Problem: Activity: Goal: Risk for activity intolerance will decrease Outcome: Progressing   Problem: Nutrition: Goal: Adequate nutrition will be maintained Outcome: Progressing   Problem: Coping: Goal: Level of anxiety will decrease Outcome: Progressing   Problem: Elimination: Goal: Will not experience complications related to bowel motility Outcome: Progressing Goal: Will not experience complications related to urinary retention Outcome: Progressing   Problem: Pain Managment: Goal: General experience of comfort will improve Outcome: Progressing   Problem:  Safety: Goal: Ability to remain free from injury will improve Outcome: Progressing   Problem: Skin Integrity: Goal: Risk for impaired skin integrity will decrease Outcome: Progressing   Problem: Education: Goal: Ability to describe self-care measures that may prevent or decrease complications (Diabetes Survival Skills Education) will improve Outcome: Progressing Goal: Individualized Educational Video(s) Outcome: Progressing   Problem: Coping: Goal: Ability to adjust to condition or change in health will improve Outcome: Progressing   Problem: Fluid Volume: Goal: Ability to maintain a balanced intake and output will improve Outcome: Progressing   Problem: Health Behavior/Discharge Planning: Goal: Ability to identify and utilize available resources and services will improve Outcome: Progressing Goal: Ability to manage health-related needs will improve Outcome: Progressing   Problem: Metabolic: Goal: Ability to maintain appropriate glucose levels will improve Outcome: Progressing   Problem: Nutritional: Goal: Maintenance of adequate nutrition will improve Outcome: Progressing Goal: Progress toward achieving an optimal weight will improve Outcome: Progressing   Problem: Skin Integrity: Goal: Risk for impaired skin integrity will decrease Outcome: Progressing   Problem: Tissue Perfusion: Goal: Adequacy of tissue perfusion will improve Outcome: Progressing   Problem: Education: Goal: Understanding of CV disease, CV risk reduction, and recovery process will improve Outcome: Progressing Goal: Individualized Educational Video(s) Outcome: Progressing   Problem: Activity: Goal: Ability to return to baseline activity level will improve Outcome: Progressing   Problem: Cardiovascular: Goal: Ability to achieve and maintain adequate cardiovascular perfusion will improve Outcome: Progressing Goal: Vascular access site(s) Level 0-1 will be maintained Outcome: Progressing    Problem: Health Behavior/Discharge Planning: Goal: Ability to safely manage health-related needs after discharge will improve Outcome: Progressing

## 2022-11-04 NOTE — Consult Note (Signed)
Reason for Consult:3 vessel CAD Referring Physician: Dr. Harvest Forest Mercedes Adams is an 66 y.o. female.  HPI: 66 yo woman presented with CP  Mercedes Adams is a 66 yo woman with a history of type II diabetes, hypertension, hyperlipidemia, arthritis, anxiety and obesity.  She presented with CP, weakness, and dizziness.  She originally thought it was indigestion, but friends noticed she was very pale.  Went to Urgent Care.  ECG showed global ischemia and she was sent to [facility]. Ruled in for non STEMI with troponin of 2500.  Profoundly anemic with a Hgb of 6.4 due to Gi bleed. She had noticed black stools about 2 weeks prior to admission.  Stabilized medically.  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  EF 30%.   No additional CP since admission.  Required multiple transfusions. Underwent EGD which revealed a duodenal ulcer with a visible vessel.  Past Medical History:  Diagnosis Date   Anxiety    Arthritis    Diabetes mellitus without complication ([place])    Heart murmur    Hyperlipidemia    Hypertension    Thyroid disease     Past Surgical History:  Procedure Laterality Date   APPENDECTOMY     CESAREAN SECTION     x 2   LEFT HEART CATH AND CORONARY ANGIOGRAPHY N/A 11/01/2022   Procedure: LEFT HEART CATH AND CORONARY ANGIOGRAPHY;  Surgeon: Adrian Prows, MD;  Location: [facility];  Service: Cardiovascular;  Laterality: N/A;   REPLACEMENT TOTAL KNEE     left knee    Family History  Problem Relation Age of Onset   Hypertension Mother    High Cholesterol Mother    Hypertension Father    High Cholesterol Father    Heart failure Father     Social History:  reports that she has never smoked. She has never used smokeless tobacco. She reports that she does not drink alcohol and does not use drugs.  Allergies: No Known Allergies  Medications: Scheduled:  sodium chloride   Intravenous Once   aspirin EC  81 mg Oral Daily   atorvastatin  80 mg Oral QHS    Chlorhexidine Gluconate Cloth  6 each Topical Daily   docusate sodium  100 mg Oral BID   empagliflozin  25 mg Oral Daily   fenofibrate  160 mg Oral Daily   folic acid  1 mg Oral Daily   furosemide  10 mg Intravenous Once   heparin  5,000 Units Subcutaneous Q8H   insulin aspart  0-20 Units Subcutaneous TID WC   insulin aspart  0-5 Units Subcutaneous QHS   isosorbide mononitrate  15 mg Oral Daily   levothyroxine  100 mcg Oral Q0600   metoprolol tartrate  25 mg Oral TID   multivitamin with minerals  1 tablet Oral Daily   [START ON 11/05/2022] pantoprazole  40 mg Intravenous Q12H   senna  1 tablet Oral Daily   sodium chloride flush  3 mL Intravenous Q12H    Results for orders placed or performed during the hospital encounter of 11/01/22 (from the past 48 hour(s))  Glucose, capillary     Status: Abnormal   Collection Time: 11/02/22 11:23 AM  Result Value Ref Range   Glucose-Capillary 112 (H) 70 - 99 mg/dL    Comment: Glucose reference range applies only to samples taken after fasting for at least 8 hours.  Glucose, capillary     Status: Abnormal   Collection Time: 11/02/22  4:10 PM  Result Value Ref Range   Glucose-Capillary 129 (H) 70 - 99 mg/dL    Comment: Glucose reference range applies only to samples taken after fasting for at least 8 hours.  Hemoglobin and hematocrit, blood     Status: Abnormal   Collection Time: 11/02/22  6:40 PM  Result Value Ref Range   Hemoglobin 8.5 (L) 12.0 - 15.0 g/dL   HCT 25.3 (L) 36.0 - 46.0 %    Comment: Performed at [facility], [place] [address], [place], [place] [address]  Glucose, capillary     Status: Abnormal   Collection Time: 11/02/22  6:55 PM  Result Value Ref Range   Glucose-Capillary 120 (H) 70 - 99 mg/dL    Comment: Glucose reference range applies only to samples taken after fasting for at least 8 hours.  Glucose, capillary     Status: Abnormal   Collection Time: 11/02/22  7:19 PM  Result Value Ref Range   Glucose-Capillary 129  (H) 70 - 99 mg/dL    Comment: Glucose reference range applies only to samples taken after fasting for at least 8 hours.  Glucose, capillary     Status: Abnormal   Collection Time: 11/02/22 11:30 PM  Result Value Ref Range   Glucose-Capillary 107 (H) 70 - 99 mg/dL    Comment: Glucose reference range applies only to samples taken after fasting for at least 8 hours.  Glucose, capillary     Status: Abnormal   Collection Time: 11/03/22  3:21 AM  Result Value Ref Range   Glucose-Capillary 110 (H) 70 - 99 mg/dL    Comment: Glucose reference range applies only to samples taken after fasting for at least 8 hours.  Basic metabolic panel     Status: Abnormal   Collection Time: 11/03/22  6:34 AM  Result Value Ref Range   Sodium 139 135 - 145 mmol/L   Potassium 3.7 3.5 - 5.1 mmol/L   Chloride 105 98 - 111 mmol/L   CO2 23 22 - 32 mmol/L   Glucose, Bld 106 (H) 70 - 99 mg/dL    Comment: Glucose reference range applies only to samples taken after fasting for at least 8 hours.   BUN 8 8 - 23 mg/dL   Creatinine, Ser 0.76 0.44 - 1.00 mg/dL   Calcium 8.3 (L) 8.9 - 10.3 mg/dL   GFR, Estimated >60 >60 mL/min    Comment: (NOTE) Calculated using the CKD-EPI Creatinine Equation (2021)    Anion gap 11 5 - 15    Comment: Performed at [facility] [address], [place], [place] [address]  CBC with Differential/Platelet     Status: Abnormal   Collection Time: 11/03/22  6:34 AM  Result Value Ref Range   WBC 10.0 4.0 - 10.5 K/uL   RBC 2.98 (L) 3.87 - 5.11 MIL/uL   Hemoglobin 8.7 (L) 12.0 - 15.0 g/dL   HCT 26.5 (L) 36.0 - 46.0 %   MCV 88.9 80.0 - 100.0 fL   MCH 29.2 26.0 - 34.0 pg   MCHC 32.8 30.0 - 36.0 g/dL   RDW 15.4 11.5 - 15.5 %   Platelets 223 150 - 400 K/uL   nRBC 0.0 0.0 - 0.2 %   Neutrophils Relative % 67 %   Neutro Abs 6.8 1.7 - 7.7 K/uL   Lymphocytes Relative 21 %   Lymphs Abs 2.1 0.7 - 4.0 K/uL   Monocytes Relative 7 %   Monocytes Absolute 0.7 0.1 - 1.0  K/uL   Eosinophils  Relative 4 %   Eosinophils Absolute 0.4 0.0 - 0.5 K/uL   Basophils Relative 1 %   Basophils Absolute 0.1 0.0 - 0.1 K/uL   Immature Granulocytes 0 %   Abs Immature Granulocytes 0.03 0.00 - 0.07 K/uL    Comment: Performed at [facility] [address] [place], [place] [address]  Glucose, capillary     Status: Abnormal   Collection Time: 11/03/22  7:22 AM  Result Value Ref Range   Glucose-Capillary 178 (H) 70 - 99 mg/dL    Comment: Glucose reference range applies only to samples taken after fasting for at least 8 hours.  Glucose, capillary     Status: Abnormal   Collection Time: 11/03/22 11:26 AM  Result Value Ref Range   Glucose-Capillary 110 (H) 70 - 99 mg/dL    Comment: Glucose reference range applies only to samples taken after fasting for at least 8 hours.  Hemoglobin and hematocrit, blood     Status: Abnormal   Collection Time: 11/03/22  3:57 PM  Result Value Ref Range   Hemoglobin 9.0 (L) 12.0 - 15.0 g/dL   HCT 27.0 (L) 36.0 - 46.0 %    Comment: Performed at [facility], [place] [address], [place], [place] [address]  CBC with Differential/Platelet     Status: Abnormal   Collection Time: 11/03/22  3:58 PM  Result Value Ref Range   WBC 10.4 4.0 - 10.5 K/uL   RBC 3.07 (L) 3.87 - 5.11 MIL/uL   Hemoglobin 9.0 (L) 12.0 - 15.0 g/dL   HCT 28.0 (L) 36.0 - 46.0 %   MCV 91.2 80.0 - 100.0 fL   MCH 29.3 26.0 - 34.0 pg   MCHC 32.1 30.0 - 36.0 g/dL   RDW 15.3 11.5 - 15.5 %   Platelets 258 150 - 400 K/uL   nRBC 0.0 0.0 - 0.2 %   Neutrophils Relative % 66 %   Neutro Abs 6.8 1.7 - 7.7 K/uL   Lymphocytes Relative 23 %   Lymphs Abs 2.4 0.7 - 4.0 K/uL   Monocytes Relative 7 %   Monocytes Absolute 0.8 0.1 - 1.0 K/uL   Eosinophils Relative 4 %   Eosinophils Absolute 0.4 0.0 - 0.5 K/uL   Basophils Relative 0 %   Basophils Absolute 0.0 0.0 - 0.1 K/uL   Immature Granulocytes 0 %   Abs Immature Granulocytes 0.03 0.00 - 0.07 K/uL    Comment: Performed at [facility],  [address] [place], [place] [address]  Glucose, capillary     Status: Abnormal   Collection Time: 11/03/22  4:12 PM  Result Value Ref Range   Glucose-Capillary 126 (H) 70 - 99 mg/dL    Comment: Glucose reference range applies only to samples taken after fasting for at least 8 hours.  Glucose, capillary     Status: Abnormal   Collection Time: 11/03/22  8:14 PM  Result Value Ref Range   Glucose-Capillary 206 (H) 70 - 99 mg/dL    Comment: Glucose reference range applies only to samples taken after fasting for at least 8 hours.  Glucose, capillary     Status: Abnormal   Collection Time: 11/03/22 11:16 PM  Result Value Ref Range   Glucose-Capillary 126 (H) 70 - 99 mg/dL    Comment: Glucose reference range applies only to samples taken after fasting for at least 8 hours.  Glucose, capillary     Status: Abnormal   Collection  Time: 11/04/22  3:54 AM  Result Value Ref Range   Glucose-Capillary 114 (H) 70 - 99 mg/dL    Comment: Glucose reference range applies only to samples taken after fasting for at least 8 hours.  CBC with Differential/Platelet     Status: Abnormal   Collection Time: 11/04/22  6:06 AM  Result Value Ref Range   WBC 9.9 4.0 - 10.5 K/uL   RBC 2.86 (L) 3.87 - 5.11 MIL/uL   Hemoglobin 8.3 (L) 12.0 - 15.0 g/dL   HCT 26.2 (L) 36.0 - 46.0 %   MCV 91.6 80.0 - 100.0 fL   MCH 29.0 26.0 - 34.0 pg   MCHC 31.7 30.0 - 36.0 g/dL   RDW 15.0 11.5 - 15.5 %   Platelets 219 150 - 400 K/uL   nRBC 0.0 0.0 - 0.2 %   Neutrophils Relative % 70 %   Neutro Abs 6.9 1.7 - 7.7 K/uL   Lymphocytes Relative 20 %   Lymphs Abs 1.9 0.7 - 4.0 K/uL   Monocytes Relative 6 %   Monocytes Absolute 0.6 0.1 - 1.0 K/uL   Eosinophils Relative 3 %   Eosinophils Absolute 0.3 0.0 - 0.5 K/uL   Basophils Relative 1 %   Basophils Absolute 0.1 0.0 - 0.1 K/uL   Immature Granulocytes 0 %   Abs Immature Granulocytes 0.03 0.00 - 0.07 K/uL    Comment: Performed at [facility], [address] [place],  [place] [address]  Glucose, capillary     Status: Abnormal   Collection Time: 11/04/22  6:53 AM  Result Value Ref Range   Glucose-Capillary 126 (H) 70 - 99 mg/dL    Comment: Glucose reference range applies only to samples taken after fasting for at least 8 hours.    ECHOCARDIOGRAM COMPLETE  Result Date: 11/02/2022    ECHOCARDIOGRAM REPORT   Patient Name:   Mercedes Adams Date of Exam: 11/02/2022 Medical Rec #:  [id number]    Height:       64.0 in Accession #:    [id number]   Weight:       188.9 lb Date of Birth:  [birth year]    BSA:          1.910 m Patient Age:    78 years     BP:           105/55 mmHg Patient Gender: F            HR:           86 bpm. Exam Location:  Inpatient Procedure: 2D Echo and Intracardiac Opacification Agent Indications:    NSTEMI  History:        Patient has no prior history of Echocardiogram examinations.                 CAD; Risk Factors:Hypertension, Diabetes and Dyslipidemia.  Sonographer:    Harvie Junior Referring Phys: [id number] Adrian Prows  Sonographer Comments: Technically difficult study due to poor echo windows. IMPRESSIONS  1. Definity was used for evaluation of wallmotion and EF estimation. Left ventricular ejection fraction, by estimation, is 50 to 55%. Left ventricular ejection fraction by 2D MOD biplane is 54.7 %. The left ventricle has low normal function. The left ventricle demonstrates regional wall motion abnormalities (see scoring diagram/findings for description). There is mild left ventricular hypertrophy. Left ventricular diastolic parameters are consistent with Grade II diastolic dysfunction (pseudonormalization). Elevated left ventricular end-diastolic pressure. There is mild hypokinesis of the left ventricular, entire  inferior wall.  2. Right ventricular systolic function is normal. The right ventricular size is normal. There is normal pulmonary artery systolic pressure. The estimated right ventricular systolic pressure is 09.8 mmHg.  3. Left atrial size was mildly dilated.   4. The mitral valve is normal in structure. Mild to moderate mitral valve regurgitation.  5. The aortic valve was not well visualized. Aortic valve regurgitation is not visualized. Aortic valve sclerosis/calcification is present, without any evidence of aortic stenosis.  6. The inferior vena cava is normal in size with greater than 50% respiratory variability, suggesting right atrial pressure of 3 mmHg. FINDINGS  Left Ventricle: Definity was used for evaluation of wallmotion and EF estimation. Left ventricular ejection fraction, by estimation, is 50 to 55%. Left ventricular ejection fraction by 2D MOD biplane is 54.7 %. The left ventricle has low normal function. The left ventricle demonstrates regional wall motion abnormalities. Mild hypokinesis of the left ventricular, entire inferior wall. Definity contrast agent was given IV to delineate the left ventricular endocardial borders. The left ventricular  internal cavity size was small. There is mild left ventricular hypertrophy. Left ventricular diastolic parameters are consistent with Grade II diastolic dysfunction (pseudonormalization). Elevated left ventricular end-diastolic pressure. Right Ventricle: The right ventricular size is normal. No increase in right ventricular wall thickness. Right ventricular systolic function is normal. There is normal pulmonary artery systolic pressure. The tricuspid regurgitant velocity is 2.80 m/s, and  with an assumed right atrial pressure of 3 mmHg, the estimated right ventricular systolic pressure is 11.9 mmHg. Left Atrium: Left atrial size was mildly dilated. Right Atrium: Right atrial size was normal in size. Pericardium: There is no evidence of pericardial effusion. Mitral Valve: The mitral valve is normal in structure. Mild to moderate mitral valve regurgitation. Tricuspid Valve: The tricuspid valve is normal in structure. Tricuspid valve regurgitation is mild. Aortic Valve: The aortic valve was not well visualized. Aortic  valve regurgitation is not visualized. Aortic valve sclerosis/calcification is present, without any evidence of aortic stenosis. Aortic valve mean gradient measures 5.0 mmHg. Aortic valve peak gradient measures 9.6 mmHg. Aortic valve area, by VTI measures 2.24 cm. Pulmonic Valve: The pulmonic valve was normal in structure. Pulmonic valve regurgitation is not visualized. No evidence of pulmonic stenosis. Aorta: The aortic root is normal in size and structure. Venous: The inferior vena cava is normal in size with greater than 50% respiratory variability, suggesting right atrial pressure of 3 mmHg. IAS/Shunts: No atrial level shunt detected by color flow Doppler.  LEFT VENTRICLE PLAX 2D                        Biplane EF (MOD) LVIDd:         3.50 cm         LV Biplane EF:   Left LVIDs:         2.90 cm                          ventricular LV PW:         1.20 cm                          ejection LV IVS:        1.30 cm                          fraction by LVOT diam:  1.80 cm                          2D MOD LV SV:         61                               biplane is LV SV Index:   32                               54.7 %. LVOT Area:     2.54 cm                                Diastology                                LV e' medial:    5.33 cm/s LV Volumes (MOD)               LV E/e' medial:  17.4 LV vol d, MOD    55.7 ml       LV e' lateral:   8.81 cm/s A2C:                           LV E/e' lateral: 10.5 LV vol d, MOD    88.6 ml A4C: LV vol s, MOD    31.4 ml A2C: LV vol s, MOD    33.4 ml A4C: LV SV MOD A2C:   24.3 ml LV SV MOD A4C:   88.6 ml LV SV MOD BP:    40.7 ml RIGHT VENTRICLE RV Basal diam:  3.30 cm RV Mid diam:    2.50 cm RV S prime:     15.30 cm/s TAPSE (M-mode): 2.3 cm LEFT ATRIUM             Index        RIGHT ATRIUM           Index LA diam:        3.40 cm 1.78 cm/m   RA Area:     10.40 cm LA Vol (A2C):   48.2 ml 25.24 ml/m  RA Volume:   22.00 ml  11.52 ml/m LA Vol (A4C):   50.1 ml 26.24 ml/m LA Biplane  Vol: 50.1 ml 26.24 ml/m  AORTIC VALVE                     PULMONIC VALVE AV Area (Vmax):    2.10 cm      PV Vmax:       1.10 m/s AV Area (Vmean):   2.27 cm      PV Peak grad:  4.8 mmHg AV Area (VTI):     2.24 cm AV Vmax:           155.00 cm/s AV Vmean:          110.000 cm/s AV VTI:            0.274 m AV Peak Grad:      9.6 mmHg AV Mean Grad:      5.0 mmHg LVOT Vmax:         128.00 cm/s LVOT Vmean:  98.300 cm/s LVOT VTI:          0.241 m LVOT/AV VTI ratio: 0.88  AORTA Ao Root diam: 3.20 cm Ao Asc diam:  3.20 cm MITRAL VALVE               TRICUSPID VALVE MV Area (PHT): 3.61 cm    TR Peak grad:   31.4 mmHg MV Decel Time: 210 msec    TR Vmax:        280.00 cm/s MR Peak grad: 83.7 mmHg MR Vmax:      457.33 cm/s  SHUNTS MV E velocity: 92.50 cm/s  Systemic VTI:  0.24 m MV A velocity: 80.60 cm/s  Systemic Diam: 1.80 cm MV E/A ratio:  1.15 Adrian Prows MD Electronically signed by Adrian Prows MD Signature Date/Time: 11/02/2022/10:10:39 AM    Final     Cardiac catheterization   Ost Cx to Prox Cx lesion is 80% stenosed.   Dist LAD-3 lesion is 60% stenosed. Dist LAD-2 lesion is 60% stenosed.   Dist LAD-2 lesion is 60% stenosed.   2nd Diag lesion is 90% stenosed.   Ost LAD to Mid LAD lesion is 30% stenosed.   Mid LAD lesion is 90% stenosed.   Dist LAD-1 lesion is 80% stenosed.   Dist LAD-2 lesion is 40% stenosed.   Mid RCA lesion is 100% stenosed.   RPAV lesion is 60% stenosed.   RPDA lesion is 40% stenosed.   There is mild to moderate left ventricular systolic dysfunction.   LV end diastolic pressure is normal.   The left ventricular ejection fraction is 35-45% by visual estimate.   Left Heart Catheterization 11/01/22:  LV: 70/0, EDP 11 mmHg.  Aortic pressure 72/44, mean 58 mmHg.  Patient received 500 cc bolus with immediate normalization of blood pressure, 90/66 mmHg. LVEF moderate to severely reduced at 30 to 35%, inferior and septal severe hypokinesis and global hypokinesis. LM: Large vessel, mildly  calcified. LAD: Large vessel, gives origin to a moderate-sized D2 with ostial 90% stenosis.  Proximal to D2 there is a focal 90 to 95% stenosis in the LAD.  Mid segment has a 80% stenosis and is diffusely diseased in the mid to distal segment all the way to the apex and moderately diffusely calcified. LCx: Moderate to large caliber vessel, again mild amount of calcification noted throughout the vessel.  Proximal segment has a focal 80% stenosis. RI: Moderate caliber vessel.  Mildly tortuous in the midsegment.  Mild disease. RCA: Very large caliber vessel.  Occluded in the mid segment of very small RV branch.  It has contralateral collaterals from the LAD and circumflex.  There appears to be microchannel's with filling of the RCA all the way to very large PDA and PL branches.  PL has about a 60% proximal stenosis and PDA has about a 30 to 40% stenosis.     I personally reviewed the cath images.  She has severe 3 vessel CAD with moderate LV dysfunction with EF ~30%  Review of Systems  Constitutional:  Positive for activity change, diaphoresis and fatigue.  HENT:  Negative for trouble swallowing and voice change.   Respiratory:  Positive for shortness of breath.   Cardiovascular:  Positive for chest pain.  Gastrointestinal:  Positive for abdominal pain.  Neurological:  Positive for dizziness and weakness.  Hematological:  Bruises/bleeds easily.   Blood pressure 102/62, pulse 79, temperature 98.2 F (36.8 C), resp. rate 18, height '5\' 4"'$  (1.626 m), weight 87.5 kg, SpO2 94 %.  Physical Exam Constitutional:      General: She is not in acute distress.    Appearance: She is obese.  HENT:     Head: Normocephalic and atraumatic.  Eyes:     Extraocular Movements: Extraocular movements intact.     Pupils: Pupils are equal, round, and reactive to light.  Neck:     Vascular: No carotid bruit.  Cardiovascular:     Rate and Rhythm: Normal rate and regular rhythm.     Heart sounds: Murmur (faint  systolic) heard.  Pulmonary:     Effort: Pulmonary effort is normal. No respiratory distress.     Breath sounds: Normal breath sounds. No wheezing.  Abdominal:     General: There is no distension.     Palpations: Abdomen is soft.  Musculoskeletal:     Cervical back: Neck supple.  Lymphadenopathy:     Cervical: No cervical adenopathy.  Skin:    General: Skin is warm.  Neurological:     General: No focal deficit present.     Mental Status: She is alert and oriented to person, place, and time.     Cranial Nerves: No cranial nerve deficit.     Motor: No weakness.     Assessment/Plan: 66 yo woman with a history of type II diabetes, hypertension, hyperlipidemia, arthritis and obesity but no prior cardiac history.  Presented with CP and weakness.  Non STEMI in setting of acute GI bleed secondary to a duodenal ulcer.  Cath showed severe 3 vessel CAD with impaired LV function.  CABG indicated for survival benefit and relief of symptoms.  As she has not had anginal symptoms outside the setting of severe anemia would favor delaying surgery for a few weeks to allow healing of ulcer site.  I discussed the general nature of the procedure, including the need for general anesthesia, the incisions to be used, the use of cardiopulmonary bypass and the use of drainage tubes and temporary pacemaker wires with Ms. Glassco.  We discussed the expected hospital stay, overall recovery and short and long term outcomes. I informed them of the indications, risks, benefits and alternatives.  She understands the risks include, but are not limited to death, stroke, MI, DVT/PE, bleeding, possible need for transfusion, infections, recurrent GI bleeding, cardiac arrhythmias, as well as other organ system dysfunction including respiratory, renal, or GI complications.   She accepts the risks and agrees to proceed.   Will arrange outpatient follow up as outpatient to schedule surgery.  Melrose Nakayama 11/04/2022,  7:13 AM

## 2022-11-05 ENCOUNTER — Inpatient Hospital Stay (HOSPITAL_COMMUNITY): Payer: Medicare Other

## 2022-11-05 ENCOUNTER — Other Ambulatory Visit (HOSPITAL_COMMUNITY): Payer: Self-pay

## 2022-11-05 DIAGNOSIS — Z01810 Encounter for preprocedural cardiovascular examination: Secondary | ICD-10-CM

## 2022-11-05 LAB — CBC WITH DIFFERENTIAL/PLATELET
Abs Immature Granulocytes: 0.04 10*3/uL (ref 0.00–0.07)
Basophils Absolute: 0.1 10*3/uL (ref 0.0–0.1)
Basophils Relative: 1 %
Eosinophils Absolute: 0.3 10*3/uL (ref 0.0–0.5)
Eosinophils Relative: 3 %
HCT: 29.9 % — ABNORMAL LOW (ref 36.0–46.0)
Hemoglobin: 9.7 g/dL — ABNORMAL LOW (ref 12.0–15.0)
Immature Granulocytes: 0 %
Lymphocytes Relative: 17 %
Lymphs Abs: 1.6 10*3/uL (ref 0.7–4.0)
MCH: 29.5 pg (ref 26.0–34.0)
MCHC: 32.4 g/dL (ref 30.0–36.0)
MCV: 90.9 fL (ref 80.0–100.0)
Monocytes Absolute: 0.6 10*3/uL (ref 0.1–1.0)
Monocytes Relative: 6 %
Neutro Abs: 6.6 10*3/uL (ref 1.7–7.7)
Neutrophils Relative %: 73 %
Platelets: 262 10*3/uL (ref 150–400)
RBC: 3.29 MIL/uL — ABNORMAL LOW (ref 3.87–5.11)
RDW: 15.3 % (ref 11.5–15.5)
WBC: 9.1 10*3/uL (ref 4.0–10.5)
nRBC: 0 % (ref 0.0–0.2)

## 2022-11-05 LAB — BASIC METABOLIC PANEL
Anion gap: 7 (ref 5–15)
BUN: 16 mg/dL (ref 8–23)
CO2: 24 mmol/L (ref 22–32)
Calcium: 8.3 mg/dL — ABNORMAL LOW (ref 8.9–10.3)
Chloride: 106 mmol/L (ref 98–111)
Creatinine, Ser: 0.88 mg/dL (ref 0.44–1.00)
GFR, Estimated: >60 mL/min (ref >60)
Glucose, Bld: 134 mg/dL — ABNORMAL HIGH (ref 70–99)
Potassium: 4.3 mmol/L (ref 3.5–5.1)
Sodium: 137 mmol/L (ref 135–145)

## 2022-11-05 LAB — SURGICAL PATHOLOGY

## 2022-11-05 LAB — GLUCOSE, CAPILLARY
Glucose-Capillary: 199 mg/dL — ABNORMAL HIGH (ref 70–99)
Glucose-Capillary: 124 mg/dL — ABNORMAL HIGH (ref 70–99)

## 2022-11-05 MED ORDER — FENOFIBRATE 160 MG PO TABS
160.0000 mg | ORAL_TABLET | Freq: Every day | ORAL | 0 refills | Status: AC
  Filled 2022-11-05: qty 90, 90d supply, fill #0

## 2022-11-05 MED ORDER — DOCUSATE SODIUM 100 MG PO CAPS
100.0000 mg | ORAL_CAPSULE | Freq: Two times a day (BID) | ORAL | 0 refills | Status: DC
  Filled 2022-11-05: qty 10, 5d supply, fill #0

## 2022-11-05 MED ORDER — ASPIRIN 81 MG PO TBEC
81.0000 mg | DELAYED_RELEASE_TABLET | Freq: Every day | ORAL | 12 refills | Status: DC
  Filled 2022-11-05: qty 30, 30d supply, fill #0

## 2022-11-05 MED ORDER — PANTOPRAZOLE SODIUM 40 MG PO TBEC
40.0000 mg | DELAYED_RELEASE_TABLET | Freq: Every day | ORAL | 2 refills | Status: DC
  Filled 2022-11-05: qty 30, 30d supply, fill #0

## 2022-11-05 MED ORDER — PANTOPRAZOLE SODIUM 40 MG PO TBEC
40.0000 mg | DELAYED_RELEASE_TABLET | Freq: Two times a day (BID) | ORAL | 0 refills | Status: DC
  Filled 2022-11-05: qty 60, 30d supply, fill #0

## 2022-11-05 MED ORDER — ATORVASTATIN CALCIUM 80 MG PO TABS
80.0000 mg | ORAL_TABLET | Freq: Every day | ORAL | 0 refills | Status: DC
  Filled 2022-11-05: qty 90, 90d supply, fill #0

## 2022-11-05 MED ORDER — NITROGLYCERIN 0.4 MG SL SUBL
0.4000 mg | SUBLINGUAL_TABLET | SUBLINGUAL | 0 refills | Status: DC | PRN
  Filled 2022-11-05: qty 25, 14d supply, fill #0

## 2022-11-05 MED ORDER — METOPROLOL SUCCINATE ER 25 MG PO TB24
25.0000 mg | ORAL_TABLET | Freq: Every day | ORAL | 0 refills | Status: DC
  Filled 2022-11-05: qty 90, 90d supply, fill #0

## 2022-11-05 NOTE — Discharge Summary (Signed)
Physician Discharge Summary  Patient ID: Mercedes Adams MRN: [id number] DOB/AGE: [birth year] 66 y.o.  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 Non-insulin-dependent diabetes mellitus type 2 with complications of hyperglycemia Hypertension. Hyperlipidemia. Hypertriglyceridemia  Procedures performed: Left heart catheterization. EGD status post intervention. Echocardiogram. Pre-CABG vascular under ultrasound  Hospital Course:   66 y.o. Caucasian female  whose past medical history and cardiac risk factors include: Multivessel CAD, Upper GIB due to duodenal ulcer s/p epi/clips/PureStat, right radial artery occluded, hypertension, hyperlipidemia, non-insulin-dependent diabetes mellitus type 2, postmenopausal female, advanced age, obesity.   Patient presented to the hospital with cardiac chest pain, elevated high sensitive troponins, and diffuse T wave inversions concerning for multivessel CAD.  She ruled in for NSTEMI and was scheduled to undergo urgent left heart catheterization with possible intervention.  Initial blood work was also concerning for possible GI bleed as her hemoglobin was 8.5 g/dL and she had melanotic stools (started couple weeks prior).  Given her presentation and NSTEMI and multiple cardiovascular risk factors including diabetes she did undergo left heart catheterization to rule out obstructive CAD.  Patient is noted to have multivessel CAD and due to acute GI bleed she did not undergo intervention.  Of note, anticoagulation was provided during the procedure but not systemic due to concern for GI bleed which may have led to the right radial artery occlusion per ultrasound.  No pain or hematoma at the right radial site.  Hand is perfusing well.  Patient is able to Abduct and adduct fingers and  palms.  Given her GI bleed hemoglobin dropped to 6.4 g/dL.  She received 2 units of blood.  Follow-up hemoglobin was 9.1 g/dL.  She was evaluated by Dr. Lyndel Safe from gastroenterology and underwent EGD.  She was noted to have a bleeding ulcer and underwent intervention.  She was monitored in the ICU given her NSTEMI and GI bleed.  Her hemoglobin remained stable at the day of discharge is 9.7 g/dL.  She still has some residual melanotic stools intermittently which may be due to prior bleed.  Blood pressures and vital signs are stable.  Patient was evaluated by Dr. Roxan Hockey from cardiothoracic surgery who recommends CABG evaluation as outpatient for now given the acute GI bleed and no active anginal pain.  Agree with the recommendations.   Discharge Exam: Temp:  [97.9 F (36.6 C)-98.4 F (36.9 C)] 98 F (36.7 C) (11/06 0743) Pulse Rate:  [70-105] 85 (11/06 1200) Cardiac Rhythm: Normal sinus rhythm (11/06 1200) Resp:  [0-27] 21 (11/06 1200) BP: (93-122)/(54-80) 98/80 (11/06 1200) SpO2:  [91 %-100 %] 95 % (11/06 1200) Weight:  [87.4 kg] 87.4 kg (11/06 0500)  Today's Vitals   11/05/22 0955 11/05/22 1000 11/05/22 1100 11/05/22 1200  BP:  115/68 114/67 98/80  Pulse: (!) 105 82  85  Resp: 14 15 (!) 21 (!) 21  Temp:      TempSrc:      SpO2: 100% 99%  95%  Weight:      Height:      PainSc:    0-No pain   Body mass index is 33.07 kg/m.  Physical Exam  Constitutional: No distress.  Age appropriate, hemodynamically stable.   Neck: No JVD present.  Cardiovascular: Normal rate, regular rhythm, S1 normal, S2 normal, intact distal pulses and normal pulses. Exam reveals no gallop, no S3 and no S4.  No murmur heard. Pulses:      Dorsalis pedis pulses are 2+ on the right side and 2+ on the left side.       Posterior tibial pulses are 2+ on the right side and 2+ on the left side.  Pulmonary/Chest: Effort normal and breath sounds normal. No stridor. She has no wheezes. She has no rales.   Abdominal: Soft. Bowel sounds are normal. She exhibits no distension. There is no abdominal tenderness.  Musculoskeletal:        General: No edema.     Cervical back: Neck supple.  Neurological: She is alert and oriented to person, place, and time. She has intact cranial nerves (2-12).  Skin: Skin is warm and moist.   Recommendations on discharge:   #1.  Continue take medications as prescribed. #2. monitor for symptoms and signs for bleeding. #3. follow-up with cardiothoracic surgery likely in 2 weeks to discuss bypass surgery. #4.  Go to the closest ER via EMS if chest pain or if you notice signs of bleeding. #5.  Follow-up with PCP in 1 week and our practice as scheduled.  #6.  Follow-up with gastroenterology within the next 4 weeks - or sooner (if directed) to follow up on biopsy results.   Significant Diagnostic Studies: CARDIAC DATABASE: EKG: 11/01/2022: 9:42 AM: Sinus tachycardia, 108 bpm, diffuse ST depressions. 1018: Sinus tachycardia, 110 bpm, diffuse ST depressions concerning for multivessel CAD.   Echocardiogram: 11/02/2022 1. Definity was used for evaluation of wallmotion and EF estimation. Left  ventricular ejection fraction, by estimation, is 50 to 55%. Left  ventricular ejection fraction by 2D MOD biplane is 54.7 %. The left  ventricle has low normal function. The left  ventricle demonstrates regional wall motion abnormalities (see scoring  diagram/findings for description). There is mild left ventricular  hypertrophy. Left ventricular diastolic parameters are consistent with  Grade II diastolic dysfunction  (pseudonormalization). Elevated left ventricular end-diastolic pressure.  There is mild hypokinesis of the left ventricular, entire inferior wall.   2. Right ventricular systolic function is normal. The right ventricular  size is normal. There is normal pulmonary artery systolic pressure. The  estimated right ventricular systolic pressure is 52.7 mmHg.   3.  Left atrial size was mildly dilated.   4. The mitral valve is normal in structure. Mild to moderate mitral valve  regurgitation.   5. The aortic valve was not well visualized. Aortic valve regurgitation  is not visualized. Aortic valve sclerosis/calcification is present,  without any evidence of aortic stenosis.   6. The inferior vena cava is normal in size with greater than 50%  respiratory variability, suggesting right atrial pressure of 3 mmHg.    Stress test: None   Heart catheterization: Left Heart Catheterization 11/01/22:  LV: 70/0, EDP 11 mmHg.  Aortic pressure 72/44, mean 58 mmHg.  Patient received 500 cc bolus with immediate normalization of blood pressure, 90/66 mmHg. LVEF moderate to severely reduced at 30 to 35%, inferior and septal severe hypokinesis and global hypokinesis. LM: Large vessel, mildly calcified. LAD: Large vessel, gives origin to a moderate-sized D2 with ostial 90% stenosis.  Proximal to D2 there is a focal 90 to 95% stenosis in the LAD.  Mid segment has a 80% stenosis and is diffusely diseased in the mid to distal segment all the way to the apex and moderately diffusely calcified. LCx: Moderate to large caliber vessel, again mild amount of calcification noted throughout the vessel.  Proximal segment has a focal 80% stenosis. RI: Moderate  caliber vessel.  Mildly tortuous in the midsegment.  Mild disease. RCA: Very large caliber vessel.  Occluded in the mid segment of very small RV branch.  It has contralateral collaterals from the LAD and circumflex.  There appears to be microchannel's with filling of the RCA all the way to very large PDA and PL branches.  PL has about a 60% proximal stenosis and PDA has about a 30 to 40% stenosis.   Impression: Severe multivessel coronary artery disease.  Moderate to severely reduced LVEF, EF anywhere from 30 to 35%, would ideally be a CABG candidate especially in view of GI bleed and difficulty in dual antiplatelet therapy  long-term.  However for now we will manage her medically, obtain the CTS consult electively, do not think that the LAD is bypassable in view of severe disease.  Large RCA with large PDA and PL branch, fairly moderate-sized D2 and large CX are bypassable.   EGD: 11/02/2022 - Duodenal ulcer w/t visible vessel s/p epi/clips/PureStat. - Small hiatal hernia. - Gastritis. Biopsied. Gastritis. Biopsied x 2. - Return patient to hospital ward for ongoing care. - Clear liquid diet. - Give Protonix (pantoprazole): 8 mg/hr IV by continuous infusion for 72hrs, then Protonix 40BID x 8 weeks, then QD - Avoid nonsteroidals - Trend CBC. - Follow Bx for HP - The findings and recommendations were discussed with the patient's family. - The findings and recommendations were discussed with the referring physician. Labs:   Lab Results  Component Value Date   WBC 9.1 11/05/2022   HGB 9.7 (L) 11/05/2022   HCT 29.9 (L) 11/05/2022   MCV 90.9 11/05/2022   PLT 262 11/05/2022    Recent Labs  Lab 11/02/22 0758 11/03/22 0634 11/05/22 0611  NA 135   < > 137  K 3.8   < > 4.3  CL 106   < > 106  CO2 22   < > 24  BUN 16   < > 16  CREATININE 0.77   < > 0.88  CALCIUM 7.9*   < > 8.3*  PROT 5.5*  --   --   BILITOT 1.7*  --   --   ALKPHOS 53  --   --   ALT 17  --   --   AST 26  --   --   GLUCOSE 118*   < > 134*   < > = values in this interval not displayed.    Lipid Panel     Component Value Date/Time   CHOL 137 11/02/2022 0758   TRIG 179 (H) 11/02/2022 0758   HDL 31 (L) 11/02/2022 0758   CHOLHDL 4.4 11/02/2022 0758   VLDL 36 11/02/2022 0758   LDLCALC 70 11/02/2022 0758    BNP (last 3 results) Recent Labs    11/01/22 1029  BNP 283.4*    HEMOGLOBIN A1C Lab Results  Component Value Date   HGBA1C 8.0 (H) 11/01/2022   MPG 182.9 11/01/2022    Cardiac Panel (last 3 results) No results for input(s): "CKTOTAL", "CKMB", "TROPONINI", "RELINDX" in the last 8760 hours.  No results found for:  "CKTOTAL", "CKMB", "CKMBINDEX", "TROPONINI"   TSH Recent Labs    11/01/22 1028  TSH 8.243*    Radiology: VAS US DOPPLER PRE CABG  Result Date: 11/05/2022 PREOPERATIVE VASCULAR EVALUATION Patient Name:  Mercedes Adams  Date of Exam:   11/05/2022 Medical Rec #: [id number]     Accession #:    [id number] Date of Birth: [birth year]  Patient Gender: F Patient Age:   59 years Exam Location:  [facility] Procedure:      VAS US DOPPLER PRE CABG Referring Phys: STEVEN HENDRICKSON --------------------------------------------------------------------------------  Indications:      Pre-CABG. Risk Factors:     Hypertension, hyperlipidemia, Diabetes, prior MI, coronary                   artery disease. Comparison Study: No prior studies. Performing Technologist: Darlin Coco RDMS, RVT Supporting Technologist: Sharion Dove RVS  Examination Guidelines: A complete evaluation includes B-mode imaging, spectral Doppler, color Doppler, and power Doppler as needed of all accessible portions of each vessel. Bilateral testing is considered an integral part of a complete examination. Limited examinations for reoccurring indications may be performed as noted.  Right Carotid Findings: +----------+--------+--------+--------+--------+------------------+           PSV cm/sEDV cm/sStenosisDescribeComments           +----------+--------+--------+--------+--------+------------------+ CCA Prox  116     11                                         +----------+--------+--------+--------+--------+------------------+ CCA Distal100     30                                         +----------+--------+--------+--------+--------+------------------+ ICA Prox  80      21                      intimal thickening +----------+--------+--------+--------+--------+------------------+ ICA Mid   81      35                                         +----------+--------+--------+--------+--------+------------------+ ICA  Distal107     43                                         +----------+--------+--------+--------+--------+------------------+ ECA       95      11                                         +----------+--------+--------+--------+--------+------------------+ +----------+--------+-------+----------------+------------+           PSV cm/sEDV cmsDescribe        Arm Pressure +----------+--------+-------+----------------+------------+ Subclavian151            Multiphasic, WNL             +----------+--------+-------+----------------+------------+ +---------+--------+--+--------+-+---------+ VertebralPSV cm/s22EDV cm/s5Antegrade +---------+--------+--+--------+-+---------+ Left Carotid Findings: +----------+--------+--------+--------+-----------+--------+           PSV cm/sEDV cm/sStenosisDescribe   Comments +----------+--------+--------+--------+-----------+--------+ CCA Prox  99      27                                  +----------+--------+--------+--------+-----------+--------+ CCA Distal86      30                                  +----------+--------+--------+--------+-----------+--------+  ICA Prox  67      26      1-39%   hyperechoic         +----------+--------+--------+--------+-----------+--------+ ICA Mid   101     40                                  +----------+--------+--------+--------+-----------+--------+ ICA Distal90      36                                  +----------+--------+--------+--------+-----------+--------+ ECA       82      11                                  +----------+--------+--------+--------+-----------+--------+  +----------+--------+--------+----------------+------------+ SubclavianPSV cm/sEDV cm/sDescribe        Arm Pressure +----------+--------+--------+----------------+------------+           135             Multiphasic, WNL             +----------+--------+--------+----------------+------------+  +---------+--------+--+--------+--+---------+ VertebralPSV cm/s38EDV cm/s12Antegrade +---------+--------+--+--------+--+---------+  ABI Findings: +---------+------------------+-----+----------+--------+ Right    Rt Pressure (mmHg)IndexWaveform  Comment  +---------+------------------+-----+----------+--------+ Brachial 118                    triphasic          +---------+------------------+-----+----------+--------+ PTA      113               0.96 monophasic         +---------+------------------+-----+----------+--------+ DP       144               1.22 biphasic           +---------+------------------+-----+----------+--------+ Great Toe81                0.69 Abnormal           +---------+------------------+-----+----------+--------+ +---------+------------------+-----+----------+-------+ Left     Lt Pressure (mmHg)IndexWaveform  Comment +---------+------------------+-----+----------+-------+ Brachial 115                    triphasic         +---------+------------------+-----+----------+-------+ PTA      93                0.79 monophasic        +---------+------------------+-----+----------+-------+ DP       126               1.07 biphasic          +---------+------------------+-----+----------+-------+ Great Toe64                0.54 Abnormal          +---------+------------------+-----+----------+-------+ Arterial wall calcification precludes accurate ankle pressures and ABIs.  Right Doppler Findings: +--------+--------+-----+---------+--------+ Site    PressureIndexDoppler  Comments +--------+--------+-----+---------+--------+ [id number]          triphasic         +--------+--------+-----+---------+--------+ Radial               OCCLUDED          +--------+--------+-----+---------+--------+ Ulnar                triphasic         +--------+--------+-----+---------+--------+  Left Doppler  Findings:  +--------+--------+-----+---------+--------+ Site    PressureIndexDoppler  Comments +--------+--------+-----+---------+--------+ [id number]          triphasic         +--------+--------+-----+---------+--------+ Radial               triphasic         +--------+--------+-----+---------+--------+ Ulnar                triphasic         +--------+--------+-----+---------+--------+   Summary: Right Carotid: The extracranial vessels were near-normal with only minimal wall                thickening or plaque. Left Carotid: Velocities in the left ICA are consistent with a 1-39% stenosis. Vertebrals:  Bilateral vertebral arteries demonstrate antegrade flow. Subclavians: Normal flow hemodynamics were seen in bilateral subclavian              arteries. Right ABI: Resting right ankle-brachial index indicates noncompressible right lower extremity arteries. The right toe-brachial index is abnormal. Although ankle-brachial index is within normal range, waveforms suggest some degree of arterial disease. Left ABI: Resting left ankle-brachial index is within normal range. The left toe-brachial index is abnormal. Although ankle-brachial index is within normal range, waveforms suggest some degree of arterial disease.  Right Upper Extremity: Inconclusive Allen's test secondary to occluded right radial artery s/p catheterization. Left Upper Extremity: Doppler waveform obliterate with left radial compression. Doppler waveforms remain within normal limits with left ulnar compression.     Preliminary    ECHOCARDIOGRAM COMPLETE  Result Date: 11/02/2022    ECHOCARDIOGRAM REPORT   Patient Name:   Mercedes Adams Date of Exam: 11/02/2022 Medical Rec #:  [id number]    Height:       64.0 in Accession #:    [id number]   Weight:       188.9 lb Date of Birth:  Feb 15, 1956    BSA:          1.910 m Patient Age:    9 years     BP:           105/55 mmHg Patient Gender: F            HR:           86 bpm. Exam Location:  Inpatient  Procedure: 2D Echo and Intracardiac Opacification Agent Indications:    NSTEMI  History:        Patient has no prior history of Echocardiogram examinations.                 CAD; Risk Factors:Hypertension, Diabetes and Dyslipidemia.  Sonographer:    Harvie Junior Referring Phys: [id number] Adrian Prows  Sonographer Comments: Technically difficult study due to poor echo windows. IMPRESSIONS  1. Definity was used for evaluation of wallmotion and EF estimation. Left ventricular ejection fraction, by estimation, is 50 to 55%. Left ventricular ejection fraction by 2D MOD biplane is 54.7 %. The left ventricle has low normal function. The left ventricle demonstrates regional wall motion abnormalities (see scoring diagram/findings for description). There is mild left ventricular hypertrophy. Left ventricular diastolic parameters are consistent with Grade II diastolic dysfunction (pseudonormalization). Elevated left ventricular end-diastolic pressure. There is mild hypokinesis of the left ventricular, entire inferior wall.  2. Right ventricular systolic function is normal. The right ventricular size is normal. There is normal pulmonary artery systolic pressure. The estimated right ventricular systolic pressure is 27.0 mmHg.  3. Left atrial size was mildly dilated.  4. The  mitral valve is normal in structure. Mild to moderate mitral valve regurgitation.  5. The aortic valve was not well visualized. Aortic valve regurgitation is not visualized. Aortic valve sclerosis/calcification is present, without any evidence of aortic stenosis.  6. The inferior vena cava is normal in size with greater than 50% respiratory variability, suggesting right atrial pressure of 3 mmHg. FINDINGS  Left Ventricle: Definity was used for evaluation of wallmotion and EF estimation. Left ventricular ejection fraction, by estimation, is 50 to 55%. Left ventricular ejection fraction by 2D MOD biplane is 54.7 %. The left ventricle has low normal function. The left  ventricle demonstrates regional wall motion abnormalities. Mild hypokinesis of the left ventricular, entire inferior wall. Definity contrast agent was given IV to delineate the left ventricular endocardial borders. The left ventricular  internal cavity size was small. There is mild left ventricular hypertrophy. Left ventricular diastolic parameters are consistent with Grade II diastolic dysfunction (pseudonormalization). Elevated left ventricular end-diastolic pressure. Right Ventricle: The right ventricular size is normal. No increase in right ventricular wall thickness. Right ventricular systolic function is normal. There is normal pulmonary artery systolic pressure. The tricuspid regurgitant velocity is 2.80 m/s, and  with an assumed right atrial pressure of 3 mmHg, the estimated right ventricular systolic pressure is 69.6 mmHg. Left Atrium: Left atrial size was mildly dilated. Right Atrium: Right atrial size was normal in size. Pericardium: There is no evidence of pericardial effusion. Mitral Valve: The mitral valve is normal in structure. Mild to moderate mitral valve regurgitation. Tricuspid Valve: The tricuspid valve is normal in structure. Tricuspid valve regurgitation is mild. Aortic Valve: The aortic valve was not well visualized. Aortic valve regurgitation is not visualized. Aortic valve sclerosis/calcification is present, without any evidence of aortic stenosis. Aortic valve mean gradient measures 5.0 mmHg. Aortic valve peak gradient measures 9.6 mmHg. Aortic valve area, by VTI measures 2.24 cm. Pulmonic Valve: The pulmonic valve was normal in structure. Pulmonic valve regurgitation is not visualized. No evidence of pulmonic stenosis. Aorta: The aortic root is normal in size and structure. Venous: The inferior vena cava is normal in size with greater than 50% respiratory variability, suggesting right atrial pressure of 3 mmHg. IAS/Shunts: No atrial level shunt detected by color flow Doppler.  LEFT  VENTRICLE PLAX 2D                        Biplane EF (MOD) LVIDd:         3.50 cm         LV Biplane EF:   Left LVIDs:         2.90 cm                          ventricular LV PW:         1.20 cm                          ejection LV IVS:        1.30 cm                          fraction by LVOT diam:     1.80 cm                          2D MOD LV SV:  61                               biplane is LV SV Index:   32                               54.7 %. LVOT Area:     2.54 cm                                Diastology                                LV e' medial:    5.33 cm/s LV Volumes (MOD)               LV E/e' medial:  17.4 LV vol d, MOD    55.7 ml       LV e' lateral:   8.81 cm/s A2C:                           LV E/e' lateral: 10.5 LV vol d, MOD    88.6 ml A4C: LV vol s, MOD    31.4 ml A2C: LV vol s, MOD    33.4 ml A4C: LV SV MOD A2C:   24.3 ml LV SV MOD A4C:   88.6 ml LV SV MOD BP:    40.7 ml RIGHT VENTRICLE RV Basal diam:  3.30 cm RV Mid diam:    2.50 cm RV S prime:     15.30 cm/s TAPSE (M-mode): 2.3 cm LEFT ATRIUM             Index        RIGHT ATRIUM           Index LA diam:        3.40 cm 1.78 cm/m   RA Area:     10.40 cm LA Vol (A2C):   48.2 ml 25.24 ml/m  RA Volume:   22.00 ml  11.52 ml/m LA Vol (A4C):   50.1 ml 26.24 ml/m LA Biplane Vol: 50.1 ml 26.24 ml/m  AORTIC VALVE                     PULMONIC VALVE AV Area (Vmax):    2.10 cm      PV Vmax:       1.10 m/s AV Area (Vmean):   2.27 cm      PV Peak grad:  4.8 mmHg AV Area (VTI):     2.24 cm AV Vmax:           155.00 cm/s AV Vmean:          110.000 cm/s AV VTI:            0.274 m AV Peak Grad:      9.6 mmHg AV Mean Grad:      5.0 mmHg LVOT Vmax:         128.00 cm/s LVOT Vmean:        98.300 cm/s LVOT VTI:          0.241 m LVOT/AV VTI ratio: 0.88  AORTA Ao Root diam: 3.20 cm Ao Asc diam:  3.20 cm MITRAL VALVE  TRICUSPID VALVE MV Area (PHT): 3.61 cm    TR Peak grad:   31.4 mmHg MV Decel Time: 210 msec    TR Vmax:        280.00 cm/s MR  Peak grad: 83.7 mmHg MR Vmax:      457.33 cm/s  SHUNTS MV E velocity: 92.50 cm/s  Systemic VTI:  0.24 m MV A velocity: 80.60 cm/s  Systemic Diam: 1.80 cm MV E/A ratio:  1.15 Adrian Prows MD Electronically signed by Adrian Prows MD Signature Date/Time: 11/02/2022/10:10:39 AM    Final    CARDIAC CATHETERIZATION  Result Date: 11/01/2022 Images from the original result were not included.   Ost Cx to Prox Cx lesion is 80% stenosed.   Dist LAD-3 lesion is 60% stenosed. Dist LAD-2 lesion is 60% stenosed.   Dist LAD-2 lesion is 60% stenosed.   2nd Diag lesion is 90% stenosed.   Ost LAD to Mid LAD lesion is 30% stenosed.   Mid LAD lesion is 90% stenosed.   Dist LAD-1 lesion is 80% stenosed.   Dist LAD-2 lesion is 40% stenosed.   Mid RCA lesion is 100% stenosed.   RPAV lesion is 60% stenosed.   RPDA lesion is 40% stenosed.   There is mild to moderate left ventricular systolic dysfunction.   LV end diastolic pressure is normal.   The left ventricular ejection fraction is 35-45% by visual estimate. Left Heart Catheterization 11/01/22: LV: 70/0, EDP 11 mmHg.  Aortic pressure 72/44, mean 58 mmHg.  Patient received 500 cc bolus with immediate normalization of blood pressure, 90/66 mmHg. LVEF moderate to severely reduced at 30 to 35%, inferior and septal severe hypokinesis and global hypokinesis. LM: Large vessel, mildly calcified. LAD: Large vessel, gives origin to a moderate-sized D2 with ostial 90% stenosis.  Proximal to D2 there is a focal 90 to 95% stenosis in the LAD.  Mid segment has a 80% stenosis and is diffusely diseased in the mid to distal segment all the way to the apex and moderately diffusely calcified. LCx: Moderate to large caliber vessel, again mild amount of calcification noted throughout the vessel.  Proximal segment has a focal 80% stenosis. RI: Moderate caliber vessel.  Mildly tortuous in the midsegment.  Mild disease. RCA: Very large caliber vessel.  Occluded in the mid segment of very small RV branch.  It has  contralateral collaterals from the LAD and circumflex.  There appears to be microchannel's with filling of the RCA all the way to very large PDA and PL branches.  PL has about a 60% proximal stenosis and PDA has about a 30 to 40% stenosis. Impression: Severe multivessel coronary artery disease.  Moderate to severely reduced LVEF, EF anywhere from 30 to 35%, would ideally be a CABG candidate especially in view of GI bleed and difficulty in dual antiplatelet therapy long-term.  However for now we will manage her medically, obtain the CTS consult electively, do not think that the LAD is bypassable in view of severe disease.  Large RCA with large PDA and PL branch, fairly moderate-sized D2 and large CX are bypassable.   DG Chest Port 1 View  Result Date: 11/01/2022 CLINICAL DATA:  Chest pressure. EXAM: PORTABLE CHEST 1 VIEW COMPARISON:  03/26/2007 FINDINGS: The heart size and mediastinal contours are within normal limits. Both lungs are clear. The visualized skeletal structures are unremarkable. IMPRESSION: No active disease. Electronically Signed   By: Kathreen Devoid M.D.   On: 11/01/2022 11:28      FOLLOW  UP PLANS AND APPOINTMENTS Discharge Instructions     Amb Referral to Cardiac Rehabilitation   Complete by: As directed    [place] not able to start CRP2 yet related to  return for CABG in the following weeks   Diagnosis: NSTEMI   After initial evaluation and assessments completed: Virtual Based Care may be provided alone or in conjunction with Phase 2 Cardiac Rehab based on patient barriers.: Yes   Intensive Cardiac Rehabilitation (ICR) [place] location only OR Traditional Cardiac Rehabilitation (TCR) *If criteria for ICR are not met will enroll in TCR [facility] only): Yes      Allergies as of 11/05/2022   No Known Allergies      Medication List     STOP taking these medications    ibuprofen 200 MG tablet Commonly known as: ADVIL   lisinopril-hydrochlorothiazide 10-12.5 MG tablet Commonly known  as: ZESTORETIC   lovastatin 20 MG tablet Commonly known as: MEVACOR   omeprazole 40 MG capsule Commonly known as: PRILOSEC       TAKE these medications    alendronate 70 MG tablet Commonly known as: FOSAMAX Take 70 mg by mouth once a week.   alum & mag hydroxide-simeth 161-096-04 MG/5ML suspension Commonly known as: MAALOX PLUS Take 10 mLs by mouth every 6 (six) hours as needed for indigestion.   aspirin EC 81 MG tablet Take 1 tablet (81 mg total) by mouth daily. Swallow whole. Start taking on: November 06, 2022   atorvastatin 80 MG tablet Commonly known as: LIPITOR Take 1 tablet (80 mg total) by mouth at bedtime.   docusate sodium 100 MG capsule Commonly known as: COLACE Take 1 capsule (100 mg total) by mouth 2 (two) times daily.   fenofibrate 160 MG tablet Take 1 tablet (160 mg total) by mouth daily. Start taking on: November 06, 5408   folic acid 1 MG tablet Commonly known as: FOLVITE Take 1 mg by mouth daily.   Jardiance 25 MG Tabs tablet Generic drug: empagliflozin Take 25 mg by mouth daily.   levothyroxine 100 MCG tablet Commonly known as: SYNTHROID Take 100 mcg by mouth daily before breakfast.   lisinopril 5 MG tablet Commonly known as: ZESTRIL Take 5 mg by mouth daily.   metFORMIN 1000 MG tablet Commonly known as: GLUCOPHAGE Take 2,000 mg by mouth in the morning and at bedtime.   methotrexate (PF) 50 MG/2ML injection Inject into the vein once a week.   metoprolol succinate 25 MG 24 hr tablet Commonly known as: Toprol XL Take 1 tablet (25 mg total) by mouth daily. Hold if systolic blood pressure (top number) less than 100 mmHg or pulse less than 60 bpm.   multivitamin capsule Take 1 capsule by mouth daily.   nitroGLYCERIN 0.4 MG SL tablet Commonly known as: NITROSTAT Place 1 tablet (0.4 mg total) under the tongue every 5 (five) minutes x 3 doses as needed for up to 30 doses for chest pain.   pantoprazole 40 MG tablet Commonly known as:  Protonix Take 1 tablet (40 mg total) by mouth 2 (two) times daily.   pantoprazole 40 MG tablet Commonly known as: Protonix Take 1 tablet (40 mg total) by mouth daily. Start taking on: December 06, 2022        Follow-up Information     [place], Collene Mares, [place]. Go on 11/19/2022.   Specialty: Cardiology Why: (Arrive by 9:45 AM) TOC s/p NSTEMI, Acute GI bleed, Awaiting CABG Contact information: [place] [address] [phone]  Vanetta Shawl [place], [place]. Schedule an appointment as soon as possible for a visit in 1 week(s).   Specialties: Family Medicine, Obstetrics and Gynecology Why: Status post hospitalization for acute GI bleed and NSTEMI Contact information: [place] [address] [phone]         Jackquline Denmark, MD. Schedule an appointment as soon as possible for a visit in 4 week(s).   Specialties: Gastroenterology, Internal Medicine Why: Acute GI bleed status post follow-up on biopsies Contact information: [place]. [place] [address] [phone]         Melrose Nakayama, MD. Go on 11/20/2022.   Specialty: Cardiothoracic Surgery Why: 4:30pm To rediscuss timing for bypass surgery Contact information: [address] [place] [address] [phone]                Total time spent: 40 minutes.  Rex Kras, [place], [facility]  Pager: [phone] Office: [phone]

## 2022-11-05 NOTE — Progress Notes (Signed)
Pre-CABG study completed.  Preliminary results relayed to [place], DO.  See CV Proc for preliminary results report.   Darlin Coco, RDMS, RVT

## 2022-11-05 NOTE — Discharge Instructions (Addendum)
#  1.  Continue take medications as prescribed. #2. monitor for symptoms and signs of bleeding. #3. follow-up with cardiothoracic surgery likely in 2 weeks to discuss bypass surgery. #4.  Go to the closest ER via EMS if chest pain or if you notice signs of bleeding. #5.  Follow-up with PCP in 1 week and our practice as scheduled.  #6.  Follow-up with gastroenterology within the next 4 weeks - or sooner (if directed) to follow up on biopsy results.

## 2022-11-05 NOTE — Progress Notes (Signed)
Discharge instructions (including medications) discussed with and copy provided to patient/caregiver 

## 2022-11-05 NOTE — Progress Notes (Addendum)
CARDIAC REHAB PHASE I     Pt post MI education including MI booklet, exercise guidelines, site care, heart healthy diabetic diet, restrictions, risk factors and CRP2 reviewed. Pt for discharge today. Will follow up with TCTS for CABG in the following weeks. Will hold CRP2 referral for now. Encouraged pt to slowly increase activity and assess tolerance.   [id number]  Vanessa Barbara, RN BSN 11/05/2022 9:24 AM

## 2022-11-05 NOTE — Plan of Care (Signed)

## 2022-11-06 ENCOUNTER — Telehealth: Payer: Self-pay

## 2022-11-06 NOTE — Telephone Encounter (Signed)
Location of hospitalization: [place] Reason for hospitalization: Chest pain and shortness of breath  Date of discharge: 11/05/2022 Date of first communication with patient: today Person contacting patient: Me Current symptoms: Patient stated she feels ok. Do you understand why you were in the Hospital: Yes Questions regarding discharge instructions: None Where were you discharged to: Home Medications reviewed: Yes Allergies reviewed: Yes Dietary changes reviewed: Yes. Discussed low fat and low salt diet.  Referals reviewed: NA Activities of Daily Living: Able to with mild limitations Any transportation issues/concerns: None Any patient concerns: None Confirmed importance & date/time of Follow up appt: Yes Confirmed with patient if condition begins to worsen call. Pt was given the office number and encouraged to call back with questions or concerns: Yes

## 2022-11-07 ENCOUNTER — Telehealth: Payer: Self-pay | Admitting: Gastroenterology

## 2022-11-07 ENCOUNTER — Encounter: Payer: Self-pay | Admitting: Gastroenterology

## 2022-11-07 NOTE — Telephone Encounter (Signed)
Patients sister is calling to follow up on further advise.

## 2022-11-07 NOTE — Telephone Encounter (Signed)
Inbound call from patients sister stating that patient had a procedure with Dr. Lyndel Safe at [facility] and patient has been having nausea and vomiting and is wanting to know if a prescription can be sent for patient. Please advise.

## 2022-11-07 NOTE — Telephone Encounter (Signed)
Pt sister Freda Munro stated that pt threw up after lunch 1 time. Pt is feeling better now, No blood seen in emesis, Only food from lunch: Chicken,  potatoes, vegetables, broccoli: Chart reviewed: Pt had EGD on 11/02/2022: Sister notified to continue to monitor pt for now since pt is feeling better and it was only 1 occurrence and to call our office back if she continues to throw up [facility] verbalized understanding with all questions answered.

## 2022-11-08 NOTE — Telephone Encounter (Signed)
Pt stated that she had another episode of Emesis  today around 1 pm. (Please see notes below for emesis yesterday) Pt stated that she ate an early breakfast; about 11 AM had a snack of peanut butter and apples and about 1 PM Pt had an episode of emesis: Pt states that she does not have nausea throughout the day, none of her family or friends are sick, No pain, No fever, Last BM this AM ( Regular)  Please advise

## 2022-11-08 NOTE — Telephone Encounter (Signed)
Patients sister called to let Dr. Lyndel Safe the patient had another occurrence today.

## 2022-11-12 ENCOUNTER — Encounter: Payer: Self-pay | Admitting: Gastroenterology

## 2022-11-13 NOTE — Telephone Encounter (Signed)
Spoke with Pt sister  Coralyn Mark [phone]: Freda Munro stated that the pt had 5 days of vomiting and went and seen her PCP yesterday  and had labs drawn and was given Zofran to help with the nausea:

## 2022-11-13 NOTE — Telephone Encounter (Signed)
Should have settled down. Mercedes Adams, can you please call and see how she is doing. RG

## 2022-11-19 ENCOUNTER — Encounter (HOSPITAL_COMMUNITY): Payer: Self-pay

## 2022-11-19 ENCOUNTER — Other Ambulatory Visit: Payer: Self-pay

## 2022-11-19 ENCOUNTER — Inpatient Hospital Stay (HOSPITAL_COMMUNITY)
Admission: EM | Admit: 2022-11-19 | Discharge: 2022-11-27 | DRG: 236 | Disposition: A | Discharge status: Skilled Nursing Facility | Payer: Medicare Other | Attending: Cardiothoracic Surgery | Admitting: Cardiothoracic Surgery

## 2022-11-19 ENCOUNTER — Ambulatory Visit: Payer: Medicare Other | Admitting: Internal Medicine

## 2022-11-19 ENCOUNTER — Emergency Department (HOSPITAL_COMMUNITY): Payer: Medicare Other

## 2022-11-19 VITALS — BP 137/78 | HR 85 | Ht 64.0 in | Wt 173.8 lb

## 2022-11-19 DIAGNOSIS — I25110 Atherosclerotic heart disease of native coronary artery with unstable angina pectoris: Principal | ICD-10-CM | POA: Diagnosis present

## 2022-11-19 DIAGNOSIS — E782 Mixed hyperlipidemia: Secondary | ICD-10-CM

## 2022-11-19 DIAGNOSIS — Z96652 Presence of left artificial knee joint: Secondary | ICD-10-CM | POA: Diagnosis present

## 2022-11-19 DIAGNOSIS — Z7984 Long term (current) use of oral hypoglycemic drugs: Secondary | ICD-10-CM | POA: Diagnosis not present

## 2022-11-19 DIAGNOSIS — I4891 Unspecified atrial fibrillation: Secondary | ICD-10-CM | POA: Diagnosis not present

## 2022-11-19 DIAGNOSIS — E785 Hyperlipidemia, unspecified: Secondary | ICD-10-CM | POA: Diagnosis present

## 2022-11-19 DIAGNOSIS — D62 Acute posthemorrhagic anemia: Secondary | ICD-10-CM | POA: Diagnosis not present

## 2022-11-19 DIAGNOSIS — R072 Precordial pain: Secondary | ICD-10-CM | POA: Diagnosis present

## 2022-11-19 DIAGNOSIS — I97190 Other postprocedural cardiac functional disturbances following cardiac surgery: Secondary | ICD-10-CM | POA: Diagnosis not present

## 2022-11-19 DIAGNOSIS — F419 Anxiety disorder, unspecified: Secondary | ICD-10-CM | POA: Diagnosis present

## 2022-11-19 DIAGNOSIS — E8779 Other fluid overload: Secondary | ICD-10-CM | POA: Diagnosis not present

## 2022-11-19 DIAGNOSIS — R0902 Hypoxemia: Secondary | ICD-10-CM | POA: Diagnosis not present

## 2022-11-19 DIAGNOSIS — E669 Obesity, unspecified: Secondary | ICD-10-CM | POA: Diagnosis present

## 2022-11-19 DIAGNOSIS — I2510 Atherosclerotic heart disease of native coronary artery without angina pectoris: Secondary | ICD-10-CM | POA: Diagnosis not present

## 2022-11-19 DIAGNOSIS — J9811 Atelectasis: Secondary | ICD-10-CM | POA: Diagnosis not present

## 2022-11-19 DIAGNOSIS — Z7982 Long term (current) use of aspirin: Secondary | ICD-10-CM

## 2022-11-19 DIAGNOSIS — I10 Essential (primary) hypertension: Secondary | ICD-10-CM | POA: Diagnosis present

## 2022-11-19 DIAGNOSIS — I252 Old myocardial infarction: Secondary | ICD-10-CM | POA: Diagnosis not present

## 2022-11-19 DIAGNOSIS — Z7983 Long term (current) use of bisphosphonates: Secondary | ICD-10-CM | POA: Diagnosis not present

## 2022-11-19 DIAGNOSIS — Z79890 Hormone replacement therapy (postmenopausal): Secondary | ICD-10-CM

## 2022-11-19 DIAGNOSIS — Z8249 Family history of ischemic heart disease and other diseases of the circulatory system: Secondary | ICD-10-CM

## 2022-11-19 DIAGNOSIS — Z79899 Other long term (current) drug therapy: Secondary | ICD-10-CM

## 2022-11-19 DIAGNOSIS — I200 Unstable angina: Secondary | ICD-10-CM

## 2022-11-19 DIAGNOSIS — Z6829 Body mass index (BMI) 29.0-29.9, adult: Secondary | ICD-10-CM

## 2022-11-19 DIAGNOSIS — E119 Type 2 diabetes mellitus without complications: Secondary | ICD-10-CM | POA: Diagnosis present

## 2022-11-19 DIAGNOSIS — Z83438 Family history of other disorder of lipoprotein metabolism and other lipidemia: Secondary | ICD-10-CM | POA: Diagnosis not present

## 2022-11-19 DIAGNOSIS — Z1152 Encounter for screening for COVID-19: Secondary | ICD-10-CM | POA: Diagnosis not present

## 2022-11-19 DIAGNOSIS — E079 Disorder of thyroid, unspecified: Secondary | ICD-10-CM | POA: Diagnosis present

## 2022-11-19 DIAGNOSIS — Z951 Presence of aortocoronary bypass graft: Secondary | ICD-10-CM

## 2022-11-19 DIAGNOSIS — I25119 Atherosclerotic heart disease of native coronary artery with unspecified angina pectoris: Secondary | ICD-10-CM | POA: Diagnosis not present

## 2022-11-19 DIAGNOSIS — I214 Non-ST elevation (NSTEMI) myocardial infarction: Secondary | ICD-10-CM

## 2022-11-19 HISTORY — DX: Unstable angina: I20.0

## 2022-11-19 LAB — BASIC METABOLIC PANEL
Anion gap: 10 (ref 5–15)
BUN: 17 mg/dL (ref 8–23)
CO2: 25 mmol/L (ref 22–32)
Calcium: 9 mg/dL (ref 8.9–10.3)
Chloride: 105 mmol/L (ref 98–111)
Creatinine, Ser: 0.92 mg/dL (ref 0.44–1.00)
GFR, Estimated: >60 mL/min (ref >60)
Glucose, Bld: 91 mg/dL (ref 70–99)
Potassium: 4.1 mmol/L (ref 3.5–5.1)
Sodium: 140 mmol/L (ref 135–145)

## 2022-11-19 LAB — CBC
HCT: 34 % — ABNORMAL LOW (ref 36.0–46.0)
Hemoglobin: 10.7 g/dL — ABNORMAL LOW (ref 12.0–15.0)
MCH: 27.6 pg (ref 26.0–34.0)
MCHC: 31.5 g/dL (ref 30.0–36.0)
MCV: 87.6 fL (ref 80.0–100.0)
Platelets: 302 10*3/uL (ref 150–400)
RBC: 3.88 MIL/uL (ref 3.87–5.11)
RDW: 14.1 % (ref 11.5–15.5)
WBC: 9.2 10*3/uL (ref 4.0–10.5)
nRBC: 0 % (ref 0.0–0.2)

## 2022-11-19 LAB — COMPREHENSIVE METABOLIC PANEL
ALT: 13 U/L (ref 0–44)
AST: 17 U/L (ref 15–41)
Albumin: 4 g/dL (ref 3.5–5.0)
Alkaline Phosphatase: 47 U/L (ref 38–126)
Anion gap: 13 (ref 5–15)
BUN: 17 mg/dL (ref 8–23)
CO2: 23 mmol/L (ref 22–32)
Calcium: 9.3 mg/dL (ref 8.9–10.3)
Chloride: 104 mmol/L (ref 98–111)
Creatinine, Ser: 0.89 mg/dL (ref 0.44–1.00)
GFR, Estimated: >60 mL/min (ref >60)
Glucose, Bld: 96 mg/dL (ref 70–99)
Potassium: 4 mmol/L (ref 3.5–5.1)
Sodium: 140 mmol/L (ref 135–145)
Total Bilirubin: 0.4 mg/dL (ref 0.3–1.2)
Total Protein: 7 g/dL (ref 6.5–8.1)

## 2022-11-19 LAB — CBG MONITORING, ED
Glucose-Capillary: 97 mg/dL (ref 70–99)
Glucose-Capillary: 149 mg/dL — ABNORMAL HIGH (ref 70–99)
Glucose-Capillary: 155 mg/dL — ABNORMAL HIGH (ref 70–99)

## 2022-11-19 LAB — TSH: TSH: 2.254 u[IU]/mL (ref 0.350–4.500)

## 2022-11-19 LAB — TROPONIN I (HIGH SENSITIVITY)
Troponin I (High Sensitivity): 7 ng/L (ref <18)
Troponin I (High Sensitivity): 7 ng/L (ref <18)

## 2022-11-19 LAB — HEPARIN LEVEL (UNFRACTIONATED): Heparin Unfractionated: <0.1 IU/mL — ABNORMAL LOW (ref 0.30–0.70)

## 2022-11-19 MED ORDER — PANTOPRAZOLE SODIUM 40 MG PO TBEC
40.0000 mg | DELAYED_RELEASE_TABLET | Freq: Two times a day (BID) | ORAL | Status: DC
  Administered 2022-11-19 – 2022-11-20 (×3): 40 mg via ORAL
  Filled 2022-11-19 (×3): qty 1

## 2022-11-19 MED ORDER — GLUCERNA SHAKE PO LIQD
237.0000 mL | Freq: Three times a day (TID) | ORAL | Status: DC
  Administered 2022-11-20 (×3): 237 mL via ORAL
  Filled 2022-11-19: qty 237

## 2022-11-19 MED ORDER — ASPIRIN 81 MG PO TBEC
81.0000 mg | DELAYED_RELEASE_TABLET | Freq: Every day | ORAL | Status: DC
  Administered 2022-11-20: 81 mg via ORAL
  Filled 2022-11-19: qty 1

## 2022-11-19 MED ORDER — EMPAGLIFLOZIN 25 MG PO TABS
25.0000 mg | ORAL_TABLET | Freq: Every day | ORAL | Status: DC
  Administered 2022-11-19 – 2022-11-20 (×2): 25 mg via ORAL
  Filled 2022-11-19 (×4): qty 1

## 2022-11-19 MED ORDER — HEPARIN BOLUS VIA INFUSION
4000.0000 [IU] | Freq: Once | INTRAVENOUS | Status: AC
  Administered 2022-11-19: 4000 [IU] via INTRAVENOUS
  Filled 2022-11-19: qty 4000

## 2022-11-19 MED ORDER — ASPIRIN 300 MG RE SUPP: 300.0000 mg | RECTAL | Status: AC

## 2022-11-19 MED ORDER — ONDANSETRON HCL 4 MG/2ML IJ SOLN: 4.0000 mg | Freq: Four times a day (QID) | INTRAMUSCULAR | Status: DC | PRN

## 2022-11-19 MED ORDER — NITROGLYCERIN IN D5W 200-5 MCG/ML-% IV SOLN
0.0000 ug/min | INTRAVENOUS | Status: DC
  Administered 2022-11-19: 5 ug/min via INTRAVENOUS
  Filled 2022-11-19: qty 250

## 2022-11-19 MED ORDER — LEVOTHYROXINE SODIUM 100 MCG PO TABS
100.0000 ug | ORAL_TABLET | Freq: Every day | ORAL | Status: DC
  Administered 2022-11-20 – 2022-11-27 (×7): 100 ug via ORAL
  Filled 2022-11-19 (×7): qty 1

## 2022-11-19 MED ORDER — ACETAMINOPHEN 325 MG PO TABS
650.0000 mg | ORAL_TABLET | ORAL | Status: DC | PRN
  Administered 2022-11-19 – 2022-11-20 (×3): 650 mg via ORAL
  Filled 2022-11-19 (×3): qty 2

## 2022-11-19 MED ORDER — DOCUSATE SODIUM 100 MG PO CAPS
100.0000 mg | ORAL_CAPSULE | Freq: Two times a day (BID) | ORAL | Status: DC
  Administered 2022-11-19 – 2022-11-20 (×3): 100 mg via ORAL
  Filled 2022-11-19 (×4): qty 1

## 2022-11-19 MED ORDER — INSULIN ASPART 100 UNIT/ML IJ SOLN
0.0000 [IU] | Freq: Three times a day (TID) | INTRAMUSCULAR | Status: DC
  Administered 2022-11-19 – 2022-11-20 (×2): 2 [IU] via SUBCUTANEOUS
  Administered 2022-11-20: 3 [IU] via SUBCUTANEOUS

## 2022-11-19 MED ORDER — INSULIN ASPART 100 UNIT/ML IJ SOLN: 0.0000 [IU] | Freq: Every day | INTRAMUSCULAR | Status: DC

## 2022-11-19 MED ORDER — HEPARIN (PORCINE) 25000 UT/250ML-% IV SOLN
1150.0000 [IU]/h | INTRAVENOUS | Status: DC
  Administered 2022-11-19: 850 [IU]/h via INTRAVENOUS
  Administered 2022-11-20: 1100 [IU]/h via INTRAVENOUS
  Filled 2022-11-19 (×2): qty 250

## 2022-11-19 MED ORDER — INSULIN ASPART 100 UNIT/ML IJ SOLN
4.0000 [IU] | Freq: Three times a day (TID) | INTRAMUSCULAR | Status: DC
  Administered 2022-11-19 – 2022-11-20 (×3): 4 [IU] via SUBCUTANEOUS

## 2022-11-19 MED ORDER — ASPIRIN 81 MG PO CHEW
324.0000 mg | CHEWABLE_TABLET | ORAL | Status: AC
  Filled 2022-11-19: qty 4

## 2022-11-19 MED ORDER — FENOFIBRATE 160 MG PO TABS
160.0000 mg | ORAL_TABLET | Freq: Every day | ORAL | Status: DC
  Administered 2022-11-20 – 2022-11-27 (×7): 160 mg via ORAL
  Filled 2022-11-19 (×8): qty 1

## 2022-11-19 MED ORDER — HEPARIN BOLUS VIA INFUSION
2000.0000 [IU] | Freq: Once | INTRAVENOUS | Status: AC
  Administered 2022-11-19: 2000 [IU] via INTRAVENOUS
  Filled 2022-11-19: qty 2000

## 2022-11-19 MED ORDER — ATORVASTATIN CALCIUM 80 MG PO TABS
80.0000 mg | ORAL_TABLET | Freq: Every day | ORAL | Status: DC
  Administered 2022-11-19 – 2022-11-26 (×8): 80 mg via ORAL
  Filled 2022-11-19 (×4): qty 1
  Filled 2022-11-19: qty 2
  Filled 2022-11-19 (×3): qty 1

## 2022-11-19 MED ORDER — METOPROLOL SUCCINATE ER 25 MG PO TB24
25.0000 mg | ORAL_TABLET | Freq: Every day | ORAL | Status: DC
  Filled 2022-11-19: qty 1

## 2022-11-19 MED ORDER — GLUCERNA SHAKE PO LIQD
237.0000 mL | Freq: Three times a day (TID) | ORAL | Status: DC
  Filled 2022-11-19: qty 237

## 2022-11-19 NOTE — ED Notes (Signed)
Patient reminded of the need of a urine sample at this time

## 2022-11-19 NOTE — ED Triage Notes (Signed)
Patient here with complaint of chest, back, and left shoulder pain that started two days ago as well as nausea. Patient is alert, oriented, speaking in complete sentences, and is in no apparent distress at this time.

## 2022-11-19 NOTE — Progress Notes (Signed)
Primary Physician/Referring:  Leonides Sake, MD  Patient ID: Mercedes Adams, female    DOB: 12/10/1956, 66 y.o.   MRN: [id number]  Chief Complaint  Patient presents with   NSTEMI non-ST elevated myocardial infarction   Hospitalization Follow-up   New Patient (Initial Visit)   Chest Pain   HPI:    Mercedes Adams  is a 66 y.o. female female with past medical history significant for diabetes, hypertension, and recent NSTEMI who is here for hospital follow-up.  About 2 weeks ago patient was admitted to the hospital with chest pain, she was found to have NSTEMI.  Cardiac catheterization showed triple-vessel disease.  Her hemoglobin was noted to be quite low, concern was for GI bleed.  Patient ended up getting scoped during her hospitalization which showed a bleeding duodenal ulcer, this was fixed by GI.  Decision was made that patient would undergo CABG evaluation with cardiothoracic surgery as an outpatient since she did not have much angina during her hospital stay.  Today she is presenting with unstable angina in the office.  Patient has had to take many sublingual nitro's over the weekend and this morning.  She is currently having chest pain at rest that radiates to her neck, jaw, and left arm.  Patient would prefer to go to the hospital and be evaluated by cardiothoracic surgery there since she has never had angina this severe.  Past Medical History:  Diagnosis Date   Anxiety    Arthritis    Diabetes mellitus without complication ([place])    Heart murmur    Hyperlipidemia    Hypertension    Thyroid disease    Past Surgical History:  Procedure Laterality Date   APPENDECTOMY     BIOPSY  11/02/2022   Procedure: BIOPSY;  Surgeon: Jackquline Denmark, MD;  Location: [facility] ENDOSCOPY;  Service: Gastroenterology;;   CESAREAN SECTION     x 2   ESOPHAGOGASTRODUODENOSCOPY (EGD) WITH PROPOFOL N/A 11/02/2022   Procedure: ESOPHAGOGASTRODUODENOSCOPY (EGD) WITH PROPOFOL;  Surgeon: Jackquline Denmark, MD;  Location:  [place];  Service: Gastroenterology;  Laterality: N/A;   HEMOSTASIS CLIP PLACEMENT  11/02/2022   Procedure: HEMOSTASIS CLIP PLACEMENT;  Surgeon: Jackquline Denmark, MD;  Location: [facility] ENDOSCOPY;  Service: Gastroenterology;;   HEMOSTASIS CONTROL  11/02/2022   Procedure: HEMOSTASIS CONTROL;  Surgeon: Jackquline Denmark, MD;  Location: [facility] ENDOSCOPY;  Service: Gastroenterology;;   LEFT HEART CATH AND CORONARY ANGIOGRAPHY N/A 11/01/2022   Procedure: LEFT HEART CATH AND CORONARY ANGIOGRAPHY;  Surgeon: Adrian Prows, MD;  Location: [place] CV LAB;  Service: Cardiovascular;  Laterality: N/A;   REPLACEMENT TOTAL KNEE     left knee   SUBMUCOSAL INJECTION  11/02/2022   Procedure: SUBMUCOSAL INJECTION;  Surgeon: Jackquline Denmark, MD;  Location: [facility] ENDOSCOPY;  Service: Gastroenterology;;   Family History  Problem Relation Age of Onset   Hypertension Mother    High Cholesterol Mother    Hypertension Father    High Cholesterol Father    Heart failure Father     Social History   Tobacco Use   Smoking status: Never   Smokeless tobacco: Never  Substance Use Topics   Alcohol use: No   Marital Status: Married  ROS  Review of Systems  Cardiovascular:  Positive for chest pain, dyspnea on exertion and palpitations.   Objective  Blood pressure 137/78, pulse 85, height '5\' 4"'$  (1.626 m), weight 173 lb 12.8 oz (78.8 kg), SpO2 99 %. Body mass index is 29.83 kg/m.  11/19/2022    2:30 PM 11/19/2022    2:15 PM 11/19/2022    2:00 PM  Vitals with BMI  Systolic 132 440 102  Diastolic 69 69 76  Pulse 79 90 81     Physical Exam Vitals reviewed.  HENT:     Head: Normocephalic and atraumatic.  Cardiovascular:     Rate and Rhythm: Normal rate and regular rhythm.     Pulses: Normal pulses.     Heart sounds: Normal heart sounds. No murmur heard. Pulmonary:     Effort: Pulmonary effort is normal.     Breath sounds: Normal breath sounds.  Abdominal:     General: Bowel sounds are normal.  Musculoskeletal:      Right lower leg: No edema.     Left lower leg: No edema.  Skin:    General: Skin is warm and dry.  Neurological:     Mental Status: She is alert.     Medications and allergies  No Known Allergies   Medication list after today's encounter  No current facility-administered medications for this visit.  Current Outpatient Medications:    alendronate (FOSAMAX) 70 MG tablet, Take 70 mg by mouth once a week., Disp: , Rfl:    alum & mag hydroxide-simeth (MAALOX PLUS) 400-400-40 MG/5ML suspension, Take 10 mLs by mouth every 6 (six) hours as needed for indigestion., Disp: , Rfl:    aspirin EC 81 MG tablet, Take 1 tablet (81 mg total) by mouth daily. Swallow whole., Disp: 30 tablet, Rfl: 12   atorvastatin (LIPITOR) 80 MG tablet, Take 1 tablet (80 mg total) by mouth at bedtime., Disp: 90 tablet, Rfl: 0   docusate sodium (COLACE) 100 MG capsule, Take 1 capsule (100 mg total) by mouth 2 (two) times daily., Disp: 10 capsule, Rfl: 0   fenofibrate 160 MG tablet, Take 1 tablet (160 mg total) by mouth daily., Disp: 90 tablet, Rfl: 0   folic acid (FOLVITE) 1 MG tablet, Take 1 mg by mouth daily., Disp: , Rfl: 0   JARDIANCE 25 MG TABS tablet, Take 25 mg by mouth daily., Disp: , Rfl:    levothyroxine (SYNTHROID, LEVOTHROID) 100 MCG tablet, Take 100 mcg by mouth daily before breakfast., Disp: , Rfl:    metFORMIN (GLUCOPHAGE) 1000 MG tablet, Take 2,000 mg by mouth in the morning and at bedtime., Disp: , Rfl:    Methotrexate Sodium (METHOTREXATE, PF,) 50 MG/2ML injection, Inject into the vein once a week., Disp: , Rfl:    metoprolol succinate (TOPROL XL) 25 MG 24 hr tablet, Take 1 tablet (25 mg total) by mouth daily. Hold if systolic blood pressure (top number) less than 100 mmHg or pulse less than 60 bpm., Disp: 90 tablet, Rfl: 0   Multiple Vitamin (MULTIVITAMIN) capsule, Take 1 capsule by mouth daily., Disp: , Rfl:    nitroGLYCERIN (NITROSTAT) 0.4 MG SL tablet, Place 1 tablet (0.4 mg total) under the tongue  every 5 (five) minutes x 3 doses as needed for up to 30 doses for chest pain., Disp: 30 tablet, Rfl: 0   pantoprazole (PROTONIX) 40 MG tablet, Take 1 tablet (40 mg total) by mouth 2 (two) times daily., Disp: 60 tablet, Rfl: 0   [START ON 12/06/2022] pantoprazole (PROTONIX) 40 MG tablet, Take 1 tablet (40 mg total) by mouth daily., Disp: 30 tablet, Rfl: 2  Facility-Administered Medications Ordered in Other Visits:    acetaminophen (TYLENOL) tablet 650 mg, 650 mg, Oral, Q4H PRN, Lisle Skillman, DO   aspirin chewable tablet  324 mg, 324 mg, Oral, NOW **OR** aspirin suppository 300 mg, 300 mg, Rectal, NOW, Victor Granados, DO   [START ON 11/20/2022] aspirin EC tablet 81 mg, 81 mg, Oral, Daily, Taydem Cavagnaro, DO   atorvastatin (LIPITOR) tablet 80 mg, 80 mg, Oral, QHS, Niana Martorana, DO   docusate sodium (COLACE) capsule 100 mg, 100 mg, Oral, BID, Phylicia Mcgaugh, DO   empagliflozin (JARDIANCE) tablet 25 mg, 25 mg, Oral, Daily, Banjamin Stovall, DO   fenofibrate tablet 160 mg, 160 mg, Oral, Daily, Kalayla Shadden, DO   heparin ADULT infusion 100 units/mL (25000 units/254m), 850 Units/hr, Intravenous, Continuous, MWilson SingerI, RPH, Last Rate: 8.5 mL/hr at 11/19/22 1411, 850 Units/hr at 11/19/22 1411   insulin aspart (novoLOG) injection 0-15 Units, 0-15 Units, Subcutaneous, TID WC, Cleotis Sparr, DO   insulin aspart (novoLOG) injection 0-5 Units, 0-5 Units, Subcutaneous, QHS, Petula Rotolo, DO   insulin aspart (novoLOG) injection 4 Units, 4 Units, Subcutaneous, TID WC, Clementina Mareno, DO   [START ON 11/20/2022] levothyroxine (SYNTHROID) tablet 100 mcg, 100 mcg, Oral, QAC breakfast, Cormick Moss, DO   metoprolol succinate (TOPROL-XL) 24 hr tablet 25 mg, 25 mg, Oral, Daily, Linwood Gullikson, DO   nitroGLYCERIN 50 mg in dextrose 5 % 250 mL (0.2 mg/mL) infusion, 0-30 mcg/min, Intravenous, Titrated, Berniece Abid, DO, Last Rate: 1.5 mL/hr at 11/19/22 1409, 5 mcg/min at 11/19/22  1409   ondansetron (ZOFRAN) injection 4 mg, 4 mg, Intravenous, Q6H PRN, Jeral Zick, DO   pantoprazole (PROTONIX) EC tablet 40 mg, 40 mg, Oral, BID, Sharnell Knight, DO  Laboratory examination:   Lab Results  Component Value Date   NA 140 11/19/2022   K 4.1 11/19/2022   CO2 25 11/19/2022   GLUCOSE 91 11/19/2022   BUN 17 11/19/2022   CREATININE 0.92 11/19/2022   CALCIUM 9.0 11/19/2022   GFRNONAA >60 11/19/2022       Latest Ref Rng & Units 11/19/2022   10:58 AM 11/05/2022    6:11 AM 11/04/2022    6:06 AM  CMP  Glucose 70 - 99 mg/dL 91  134  132   BUN 8 - 23 mg/dL '17  16  13   '$ Creatinine 0.44 - 1.00 mg/dL 0.92  0.88  0.79   Sodium 135 - 145 mmol/L 140  137  136   Potassium 3.5 - 5.1 mmol/L 4.1  4.3  4.0   Chloride 98 - 111 mmol/L 105  106  104   CO2 22 - 32 mmol/L '25  24  24   '$ Calcium 8.9 - 10.3 mg/dL 9.0  8.3  8.0       Latest Ref Rng & Units 11/19/2022   10:58 AM 11/05/2022    6:11 AM 11/04/2022    4:22 PM  CBC  WBC 4.0 - 10.5 K/uL 9.2  9.1  10.2   Hemoglobin 12.0 - 15.0 g/dL 10.7  9.7  8.9   Hematocrit 36.0 - 46.0 % 34.0  29.9  27.7   Platelets 150 - 400 K/uL 302  262  247     Lipid Panel Recent Labs    11/02/22 0758  CHOL 137  TRIG 179*  LDLCALC 70  VLDL 36  HDL 31*  CHOLHDL 4.4  LDLDIRECT 79    HEMOGLOBIN A1C Lab Results  Component Value Date   HGBA1C 8.0 (H) 11/01/2022   MPG 182.9 11/01/2022   TSH Recent Labs    11/01/22 1028  TSH 8.243*    External labs:     Radiology:  Cardiac Studies:   ECHO COMPLETE WITH IMAGING ENHANCING AGENT 11/02/2022  1. Definity was used for evaluation of wallmotion and EF estimation. Left ventricular ejection fraction, by estimation, is 50 to 55%. Left ventricular ejection fraction by 2D MOD biplane is 54.7 %. The left ventricle has low normal function. The left ventricle demonstrates regional wall motion abnormalities (see scoring diagram/findings for description). There is mild left ventricular  hypertrophy. Left ventricular diastolic parameters are consistent with Grade II diastolic dysfunction (pseudonormalization). Elevated left ventricular end-diastolic pressure. There is mild hypokinesis of the left ventricular, entire inferior wall. 2. Right ventricular systolic function is normal. The right ventricular size is normal. There is normal pulmonary artery systolic pressure. The estimated right ventricular systolic pressure is 59.1 mmHg. 3. Left atrial size was mildly dilated. 4. The mitral valve is normal in structure. Mild to moderate mitral valve regurgitation. 5. The aortic valve was not well visualized. Aortic valve regurgitation is not visualized. Aortic valve sclerosis/calcification is present, without any evidence of aortic stenosis. 6. The inferior vena cava is normal in size with greater than 50% respiratory variability, suggesting right atrial pressure of 3 mmHg.         11/01/2022:    Impression: Severe multivessel coronary artery disease.  Moderate to severely reduced LVEF, EF anywhere from 30 to 35%, would ideally be a CABG candidate especially in view of GI bleed and difficulty in dual antiplatelet therapy long-term.  However for now we will manage her medically, obtain the CTS consult electively, do not think that the LAD is bypassable in view of severe disease.  Large RCA with large PDA and PL branch, fairly moderate-sized D2 and large CX are bypassable.   EKG:   11/19/2022: Sinus Rhythm -Old inferior infarct with true posterior extension of inferior MI.   Assessment     ICD-10-CM   1. NSTEMI (non-ST elevated myocardial infarction) ([place])  I21.4 EKG 12-Lead    2. Essential hypertension  I10     3. Mixed hyperlipidemia  E78.2     4. Type 2 diabetes mellitus without complication, without long-term current use of insulin (HCC)  E11.9     5. Unstable angina (HCC)  I20.0        Orders Placed This Encounter  Procedures   EKG 12-Lead    No orders of the  defined types were placed in this encounter.   Medications Discontinued During This Encounter  Medication Reason   lisinopril (ZESTRIL) 5 MG tablet Change in therapy     Recommendations:   Mercedes Adams is a 66 y.o.  female with active chest pain   NSTEMI (non-ST elevated myocardial infarction) [facility]) Patient being evaluated for CABG   Essential hypertension Continue current cardiac medications. Encourage low-sodium diet, less than 2000 mg daily.   Mixed hyperlipidemia Continue statin   Type 2 diabetes mellitus without complication, without long-term current use of insulin ([place]) Managed by primary   Unstable angina [facility]) Direct admit to [facility], cardiology service Will check hgb and reach out to CVTS Patient started on nitro and heparin gtt     Floydene Flock, DO, [facility]  11/19/2022, 3:03 PM Office: [phone] Pager: [phone]

## 2022-11-19 NOTE — H&P (Signed)
CARDIOLOGY ADMIT NOTE   Patient ID: Mercedes Adams MRN: [id number] DOB/AGE: 67-09-1956 67 y.o.  Admit date: 11/19/2022 Primary Physician:  Leonides Sake, MD  Patient ID: [name], female    DOB: 1956/10/14, 66 y.o.   MRN: [id number]  Chief Complaint  Patient presents with   Chest Pain   HPI:    Mercedes Adams  is a 66 y.o. female with past medical history significant for diabetes, hypertension, and recent NSTEMI who presented to the office this morning for hospital follow-up.  About 2 weeks ago patient was admitted to the hospital with chest pain, she was found to have NSTEMI.  Cardiac catheterization showed triple-vessel disease at that time.  Her hemoglobin was noted to be quite low, concern was for GI bleed.  Patient ended up getting scoped during her hospitalization which showed a bleeding duodenal ulcer, this was fixed by GI.  Decision was made that patient would undergo CABG evaluation with cardiothoracic surgery as an outpatient since she did not have much angina during her hospital stay.   Today she is presenting with unstable angina in the office.  Patient has had to take many sublingual nitro's over the weekend and this morning.  She is currently having chest pain at rest that radiates to her neck, jaw, and left arm.  Patient would prefer to go to the hospital and be evaluated by cardiothoracic surgery there since she has never had angina this severe. Patient sent to ER for direct admission to cardiology.   Past Medical History:  Diagnosis Date   Anxiety    Arthritis    Diabetes mellitus without complication ([place])    Heart murmur    Hyperlipidemia    Hypertension    Thyroid disease    Past Surgical History:  Procedure Laterality Date   APPENDECTOMY     BIOPSY  11/02/2022   Procedure: BIOPSY;  Surgeon: Jackquline Denmark, MD;  Location: [facility] ENDOSCOPY;  Service: Gastroenterology;;   CESAREAN SECTION     x 2   ESOPHAGOGASTRODUODENOSCOPY (EGD) WITH PROPOFOL N/A 11/02/2022    Procedure: ESOPHAGOGASTRODUODENOSCOPY (EGD) WITH PROPOFOL;  Surgeon: Jackquline Denmark, MD;  Location: [place];  Service: Gastroenterology;  Laterality: N/A;   HEMOSTASIS CLIP PLACEMENT  11/02/2022   Procedure: HEMOSTASIS CLIP PLACEMENT;  Surgeon: Jackquline Denmark, MD;  Location: [facility] ENDOSCOPY;  Service: Gastroenterology;;   HEMOSTASIS CONTROL  11/02/2022   Procedure: HEMOSTASIS CONTROL;  Surgeon: Jackquline Denmark, MD;  Location: [facility] ENDOSCOPY;  Service: Gastroenterology;;   LEFT HEART CATH AND CORONARY ANGIOGRAPHY N/A 11/01/2022   Procedure: LEFT HEART CATH AND CORONARY ANGIOGRAPHY;  Surgeon: Adrian Prows, MD;  Location: [place] CV LAB;  Service: Cardiovascular;  Laterality: N/A;   REPLACEMENT TOTAL KNEE     left knee   SUBMUCOSAL INJECTION  11/02/2022   Procedure: SUBMUCOSAL INJECTION;  Surgeon: Jackquline Denmark, MD;  Location: [facility] ENDOSCOPY;  Service: Gastroenterology;;   Social History   Socioeconomic History   Marital status: Married    Spouse name: Not on file   Number of children: 2   Years of education: Not on file   Highest education level: Not on file  Occupational History   Not on file  Tobacco Use   Smoking status: Never   Smokeless tobacco: Never  Vaping Use   Vaping Use: Never used  Substance and Sexual Activity   Alcohol use: No   Drug use: No   Sexual activity: Not on file  Other Topics Concern   Not on file  Social History Narrative   Not on file   Social Determinants of Health   Financial Resource Strain: Not on file  Food Insecurity: Not on file  Transportation Needs: Not on file  Physical Activity: Not on file  Stress: Not on file  Social Connections: Not on file  Intimate Partner Violence: Not on file   Family History  Problem Relation Age of Onset   Hypertension Mother    High Cholesterol Mother    Hypertension Father    High Cholesterol Father    Heart failure Father     ROS  Review of Systems  Cardiovascular:  Positive for chest pain, dyspnea on  exertion and palpitations.   Objective      11/19/2022    1:15 PM 11/19/2022   12:40 PM 11/19/2022   12:10 PM  Vitals with BMI  Height  '5\' 4"'$    Weight  174 lbs 3 oz   BMI  65.78   Systolic 469 629 528  Diastolic 59 74 73  Pulse 78 87 77      Physical Exam Vitals reviewed.  HENT:     Head: Normocephalic and atraumatic.  Cardiovascular:     Rate and Rhythm: Normal rate and regular rhythm.     Pulses: Normal pulses.     Heart sounds: Normal heart sounds. No murmur heard. Pulmonary:     Effort: Pulmonary effort is normal.     Breath sounds: Normal breath sounds.  Abdominal:     General: Bowel sounds are normal.  Musculoskeletal:     Right lower leg: No edema.     Left lower leg: No edema.  Skin:    General: Skin is warm and dry.  Neurological:     Mental Status: She is alert.    Laboratory examination:    Recent Labs    11/04/22 0606 11/05/22 0611 11/19/22 1058  NA 136 137 140  K 4.0 4.3 4.1  CL 104 106 105  CO2 '24 24 25  '$ GLUCOSE 132* 134* 91  BUN '13 16 17  '$ CREATININE 0.79 0.88 0.92  CALCIUM 8.0* 8.3* 9.0  GFRNONAA >60 >60 >60   estimated creatinine clearance is 60.3 mL/min (by C-G formula based on SCr of 0.92 mg/dL).     Latest Ref Rng & Units 11/19/2022   10:58 AM 11/05/2022    6:11 AM 11/04/2022    6:06 AM  CMP  Glucose 70 - 99 mg/dL 91  134  132   BUN 8 - 23 mg/dL '17  16  13   '$ Creatinine 0.44 - 1.00 mg/dL 0.92  0.88  0.79   Sodium 135 - 145 mmol/L 140  137  136   Potassium 3.5 - 5.1 mmol/L 4.1  4.3  4.0   Chloride 98 - 111 mmol/L 105  106  104   CO2 22 - 32 mmol/L '25  24  24   '$ Calcium 8.9 - 10.3 mg/dL 9.0  8.3  8.0       Latest Ref Rng & Units 11/19/2022   10:58 AM 11/05/2022    6:11 AM 11/04/2022    4:22 PM  CBC  WBC 4.0 - 10.5 K/uL 9.2  9.1  10.2   Hemoglobin 12.0 - 15.0 g/dL 10.7  9.7  8.9   Hematocrit 36.0 - 46.0 % 34.0  29.9  27.7   Platelets 150 - 400 K/uL 302  262  247    Lipid Panel     Component Value Date/Time   CHOL 137  11/02/2022 0758   TRIG 179 (H) 11/02/2022 0758   HDL 31 (L) 11/02/2022 0758   CHOLHDL 4.4 11/02/2022 0758   VLDL 36 11/02/2022 0758   LDLCALC 70 11/02/2022 0758   LDLDIRECT 79 11/02/2022 0758   HEMOGLOBIN A1C Lab Results  Component Value Date   HGBA1C 8.0 (H) 11/01/2022   MPG 182.9 11/01/2022   TSH Recent Labs    11/01/22 1028  TSH 8.243*   BNP (last 3 results) Recent Labs    11/01/22 1029  BNP 283.4*   Cardiac Panel (last 3 results) Recent Labs    11/19/22 1058  TROPONINIHS 7     Medications and allergies  No Known Allergies   Prior to Admission medications   Medication Sig Start Date End Date Taking? Authorizing Provider  alendronate (FOSAMAX) 70 MG tablet Take 70 mg by mouth once a week. 07/27/22   [provider]  alum & mag hydroxide-simeth (MAALOX PLUS) 400-400-40 MG/5ML suspension Take 10 mLs by mouth every 6 (six) hours as needed for indigestion.    [provider]  aspirin EC 81 MG tablet Take 1 tablet (81 mg total) by mouth daily. Swallow whole. 11/06/22   Tolia, Sunit, DO  atorvastatin (LIPITOR) 80 MG tablet Take 1 tablet (80 mg total) by mouth at bedtime. 11/05/22 02/03/23  Tolia, Sunit, DO  docusate sodium (COLACE) 100 MG capsule Take 1 capsule (100 mg total) by mouth 2 (two) times daily. 11/05/22   Tolia, Sunit, DO  fenofibrate 160 MG tablet Take 1 tablet (160 mg total) by mouth daily. 11/06/22 02/04/23  Tolia, Sunit, DO  folic acid (FOLVITE) 1 MG tablet Take 1 mg by mouth daily. 09/06/17   [provider]  JARDIANCE 25 MG TABS tablet Take 25 mg by mouth daily.    [provider]  levothyroxine (SYNTHROID, LEVOTHROID) 100 MCG tablet Take 100 mcg by mouth daily before breakfast.    [provider]  metFORMIN (GLUCOPHAGE) 1000 MG tablet Take 2,000 mg by mouth in the morning and at bedtime.    [provider]  Methotrexate Sodium (METHOTREXATE, PF,) 50 MG/2ML injection Inject into the vein once a week.    [provider]  metoprolol succinate (TOPROL XL) 25 MG 24 hr tablet Take 1 tablet (25 mg total) by mouth daily. Hold if systolic blood pressure (top number) less than 100 mmHg or pulse less than 60 bpm. 11/05/22 02/03/23  Tolia, Sunit, DO  Multiple Vitamin (MULTIVITAMIN) capsule Take 1 capsule by mouth daily.    [provider]  nitroGLYCERIN (NITROSTAT) 0.4 MG SL tablet Place 1 tablet (0.4 mg total) under the tongue every 5 (five) minutes x 3 doses as needed for up to 30 doses for chest pain. 11/05/22   Tolia, Sunit, DO  pantoprazole (PROTONIX) 40 MG tablet Take 1 tablet (40 mg total) by mouth 2 (two) times daily. 11/05/22 12/05/22  Tolia, Sunit, DO  pantoprazole (PROTONIX) 40 MG tablet Take 1 tablet (40 mg total) by mouth daily. 12/06/22 03/06/23  Tolia, Sunit, DO     nitroGLYCERIN      Current Outpatient Medications  Medication Instructions   alendronate (FOSAMAX) 70 mg, Oral, Weekly   alum & mag hydroxide-simeth (MAALOX PLUS) 400-400-40 MG/5ML suspension 10 mLs, Oral, Every 6 hours PRN   Aspirin Low Dose 81 mg, Oral, Daily, Swallow whole.   atorvastatin (LIPITOR) 80 mg, Oral, Daily at bedtime   docusate sodium (COLACE) 100 mg, Oral, 2 times daily   fenofibrate 160 mg, Oral,  Daily   folic acid (FOLVITE) 1 mg, Oral, Daily   Jardiance 25 mg, Oral, Daily   levothyroxine (SYNTHROID) 100 mcg, Oral, Daily before breakfast   metFORMIN (GLUCOPHAGE) 2,000 mg, Oral, 2 times daily   Methotrexate Sodium (METHOTREXATE, PF,) 50 MG/2ML injection Intravenous, Weekly   metoprolol succinate (TOPROL XL) 25 mg, Oral, Daily, Hold if systolic blood pressure (top number) less than 100 mmHg or pulse less than 60 bpm.   Multiple Vitamin (MULTIVITAMIN) capsule 1 capsule, Oral, Daily   nitroGLYCERIN (NITROSTAT) 0.4 mg, Sublingual, Every 5 min x3 PRN   pantoprazole (PROTONIX) 40 mg, Oral, 2 times daily   [START ON 12/06/2022] pantoprazole (PROTONIX) 40 mg, Oral, Daily    No intake/output data recorded. No  intake/output data recorded.    Radiology:  Imaging results have been reviewed and DG Chest 2 View  Result Date: 11/19/2022 CLINICAL DATA:  Provided history: Chest pain. EXAM: CHEST - 2 VIEW COMPARISON:  Prior chest radiographs 11/01/2022 and earlier. FINDINGS: Heart size within normal limits. No appreciable airspace consolidation or pulmonary edema. No evidence of pleural effusion or pneumothorax. Degenerative changes of the spine. Surgical clip(s) within the right upper quadrant of the abdomen. IMPRESSION: No evidence of active cardiopulmonary disease. Electronically Signed   By: Kellie Simmering D.O.   On: 11/19/2022 11:44    Cardiac Studies:   ECHO COMPLETE WITH IMAGING ENHANCING AGENT 11/02/2022   1. Definity was used for evaluation of wallmotion and EF estimation. Left ventricular ejection fraction, by estimation, is 50 to 55%. Left ventricular ejection fraction by 2D MOD biplane is 54.7 %. The left ventricle has low normal function. The left ventricle demonstrates regional wall motion abnormalities (see scoring diagram/findings for description). There is mild left ventricular hypertrophy. Left ventricular diastolic parameters are consistent with Grade II diastolic dysfunction (pseudonormalization). Elevated left ventricular end-diastolic pressure. There is mild hypokinesis of the left ventricular, entire inferior wall. 2. Right ventricular systolic function is normal. The right ventricular size is normal. There is normal pulmonary artery systolic pressure. The estimated right ventricular systolic pressure is 11.1 mmHg. 3. Left atrial size was mildly dilated. 4. The mitral valve is normal in structure. Mild to moderate mitral valve regurgitation. 5. The aortic valve was not well visualized. Aortic valve regurgitation is not visualized. Aortic valve sclerosis/calcification is present, without any evidence of aortic stenosis. 6. The inferior vena cava is normal in size with greater than 50%  respiratory variability, suggesting right atrial pressure of 3 mmHg.              11/01/2022:    Impression: Severe multivessel coronary artery disease.  Moderate to severely reduced LVEF, EF anywhere from 30 to 35%, would ideally be a CABG candidate especially in view of GI bleed and difficulty in dual antiplatelet therapy long-term.  However for now we will manage her medically, obtain the CTS consult electively, do not think that the LAD is bypassable in view of severe disease.  Large RCA with large PDA and PL branch, fairly moderate-sized D2 and large CX are bypassable.   Heart catheterization: Left Heart Catheterization 11/01/22:  LV: 70/0, EDP 11 mmHg.  Aortic pressure 72/44, mean 58 mmHg.  Patient received 500 cc bolus with immediate normalization of blood pressure, 90/66 mmHg. LVEF moderate to severely reduced at 30 to 35%, inferior and septal severe hypokinesis and global hypokinesis. LM: Large vessel, mildly calcified. LAD: Large vessel, gives origin to a moderate-sized D2 with ostial 90% stenosis.  Proximal to D2 there is a  focal 90 to 95% stenosis in the LAD.  Mid segment has a 80% stenosis and is diffusely diseased in the mid to distal segment all the way to the apex and moderately diffusely calcified. LCx: Moderate to large caliber vessel, again mild amount of calcification noted throughout the vessel.  Proximal segment has a focal 80% stenosis. RI: Moderate caliber vessel.  Mildly tortuous in the midsegment.  Mild disease. RCA: Very large caliber vessel.  Occluded in the mid segment of very small RV branch.  It has contralateral collaterals from the LAD and circumflex.  There appears to be microchannel's with filling of the RCA all the way to very large PDA and PL branches.  PL has about a 60% proximal stenosis and PDA has about a 30 to 40% stenosis.      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   Assessment   Unstable  angina with known CAD: LAD, Lcx, and RCA per [facility] 11/01/2022  Recent GIB 2/2 duodenal ulcer s/p EGD  Hypertension  Diabetes  Hyperlipidemia   Recommendations:   Admit to cardiology service Discussed with CVTS coordinator Start patient on heparin and nitro gtt Optimize electrolytes Sliding scale insulin Heart healthy diet Restarted appropriate home meds Daily labs CABG eval pending    Floydene Flock, DO, [facility] 11/19/2022, 1:40 PM Office: [phone] Fax: [phone] Pager: [phone]

## 2022-11-19 NOTE — ED Notes (Signed)
patient reports that he pain has improved since increasing Nitro and tylenol

## 2022-11-19 NOTE — Progress Notes (Signed)
ANTICOAGULATION CONSULT NOTE   Pharmacy Consult for Heparin  Indication: chest pain/ACS, awaiting CABG  No Known Allergies  Patient Measurements: Height: '5\' 4"'$  (162.6 cm) Weight: 79 kg (174 lb 2.6 oz) IBW/kg (Calculated) : 54.7 Heparin Dosing Weight: 71 kg   Vital Signs: Temp: 98.4 F (36.9 C) (11/20 2042) BP: 114/65 (11/20 2315) Pulse Rate: 78 (11/20 2315)  Labs: Recent Labs    11/19/22 1058 11/19/22 1251 11/19/22 1335 11/19/22 2316  HGB 10.7*  --   --   --   HCT 34.0*  --   --   --   PLT 302  --   --   --   HEPARINUNFRC  --   --   --  <0.10*  CREATININE 0.92  --  0.89  --   TROPONINIHS 7 7  --   --      Estimated Creatinine Clearance: 62.4 mL/min (by C-G formula based on SCr of 0.89 mg/dL).   Medical History: Past Medical History:  Diagnosis Date   Anxiety    Arthritis    Diabetes mellitus without complication (HCC)    Heart murmur    Hyperlipidemia    Hypertension    Thyroid disease     Assessment: 66 yo F presented w chest/back/shoulder pain x2 days found to have unstable angina. no anticoagulants PTA per fill hx. Pt had NSTEMI 11/01/22 s/p LHC 11/2.  CBC Hgb 10.7/Plt 302  11/20 PM update:  Heparin level low CABG 11/22 AM  Goal of Therapy:  Heparin level 0.3-0.7 units/ml Monitor platelets by anticoagulation protocol: Yes   Plan:  Heparin 2000 units bolus Inc heparin to 1000 units/hr Heparin level in 8 hours  Narda Bonds, PharmD, [place] Pharmacist Phone: [phone]

## 2022-11-19 NOTE — Progress Notes (Signed)
Procedure(s) (LRB): CORONARY ARTERY BYPASS GRAFTING (CABG) (N/A) TRANSESOPHAGEAL ECHOCARDIOGRAM (TEE) (N/A) Subjective: Patient examined, images of cardiac catheterization and 2D echocardiogram personally reviewed.  Medical record regarding treatment of her actively bleeding duodenal ulcer when she presented earlier this month also reviewed.  Patient was evaluated for coronary bypass grafting by Dr. Roxan Hockey after she sustained a non-STEMI during admission for upper GI bleeding from a duodenal ulcer with hemoglobin of 6.5.  The ulcer was treated endoscopically and she was transfused 2 units of packed cells.  Surgery was scheduled after period of recovery however she presented today with unstable angina and is now admitted.  Her hemoglobin is now 10.7.  Her angina is controlled on IV heparin.  She will be scheduled for multivessel CABG on Wednesday, November 22.  I have discussed the procedure of CABG in detail with the patient and her sister and she understands the benefits and risks and agrees to proceed.  Objective: Vital signs in last 24 hours: Temp:  [97.9 F (36.6 C)-98.1 F (36.7 C)] 98 F (36.7 C) (11/20 1620) Pulse Rate:  [77-98] 89 (11/20 1830) Cardiac Rhythm: Normal sinus rhythm (11/20 1241) Resp:  [14-23] 20 (11/20 1830) BP: (101-145)/(55-84) 139/84 (11/20 1830) SpO2:  [95 %-100 %] 98 % (11/20 1830) Weight:  [78.8 kg-79 kg] 79 kg (11/20 1240)  Hemodynamic parameters for last 24 hours:    Intake/Output from previous day: No intake/output data recorded. Intake/Output this shift: No intake/output data recorded.       Exam    General- alert and comfortable on IV heparin without angina    Neck- no JVD, no cervical adenopathy palpable, no carotid bruit   Lungs- clear without rales, wheezes   Cor- regular rate and rhythm, no murmur , gallop   Abdomen- soft, non-tender   Extremities - warm, non-tender, minimal edema   Neuro- oriented, appropriate, no focal weakness    Lab Results: Recent Labs    11/19/22 1058  WBC 9.2  HGB 10.7*  HCT 34.0*  PLT 302   BMET:  Recent Labs    11/19/22 1058 11/19/22 1335  NA 140 140  K 4.1 4.0  CL 105 104  CO2 25 23  GLUCOSE 91 96  BUN 17 17  CREATININE 0.92 0.89  CALCIUM 9.0 9.3    PT/INR: No results for input(s): "LABPROT", "INR" in the last 72 hours. ABG No results found for: "PHART", "HCO3", "TCO2", "ACIDBASEDEF", "O2SAT" CBG (last 3)  Recent Labs    11/19/22 1350 11/19/22 1707  GLUCAP 97 149*    Assessment/Plan: S/P Procedure(s) (LRB): CORONARY ARTERY BYPASS GRAFTING (CABG) (N/A) TRANSESOPHAGEAL ECHOCARDIOGRAM (TEE) (N/A) Multivessel CABG planned in a.m. November 22. She understands that she will probably need blood transfusions during the procedure.  LOS: 0 days    Mercedes Adams 11/19/2022

## 2022-11-19 NOTE — ED Provider Triage Note (Signed)
Emergency Medicine Provider Triage Evaluation Note  Mercedes Adams , a 66 y.o. female  was evaluated in triage.  Patient with history of diabetes,, GI bleed, NSTEMI 2 weeks ago presented to ER today for evaluation of left-sided chest and left shoulder pain ongoing x2-3 days, no clear inciting event pain occurs mostly at rest.  No clear inciting event, pain occurs randomly.  She reports associated lightheadedness.  Review of Systems  Positive: Chest/left shoulder pain Negative: Cough/hemoptysis, fever, chills, abdominal pain, melena, hematochezia, extremity swelling/color change or any additional concerns  Physical Exam  BP 119/73 (BP Location: Right Arm)   Pulse 83   Temp 98.1 F (36.7 C)   Resp 16   SpO2 100%  Gen:   Awake, no distress   Resp:  Normal effort  MSK:   Moves extremities without difficulty  Other:  Heart rate and rhythm.  Abdomen soft nontender.  Medical Decision Making  Medically screening exam initiated at 11:01 AM.  Appropriate orders placed.  MAHNOOR MATHISEN was informed that the remainder of the evaluation will be completed by another provider, this initial triage assessment does not replace that evaluation, and the importance of remaining in the ED until their evaluation is complete.  Requested patient not be placed in the lobby and remains on cardiac monitor.  Note: Portions of this report may have been transcribed using voice recognition software. Every effort was made to ensure accuracy; however, inadvertent computerized transcription errors may still be present.    Deliah Boston, PA-C 11/19/22 1103

## 2022-11-19 NOTE — Progress Notes (Signed)
ANTICOAGULATION CONSULT NOTE - Initial Consult  Pharmacy Consult for heparin infusion  Indication: chest pain/ACS  No Known Allergies  Patient Measurements: Height: '5\' 4"'$  (162.6 cm) Weight: 79 kg (174 lb 2.6 oz) IBW/kg (Calculated) : 54.7 Heparin Dosing Weight: 71 kg   Vital Signs: Temp: 97.9 F (36.6 C) (11/20 1210) BP: 101/59 (11/20 1315) Pulse Rate: 78 (11/20 1315)  Labs: Recent Labs    11/19/22 1058  HGB 10.7*  HCT 34.0*  PLT 302  CREATININE 0.92  TROPONINIHS 7    Estimated Creatinine Clearance: 60.3 mL/min (by C-G formula based on SCr of 0.92 mg/dL).   Medical History: Past Medical History:  Diagnosis Date   Anxiety    Arthritis    Diabetes mellitus without complication (HCC)    Heart murmur    Hyperlipidemia    Hypertension    Thyroid disease     Assessment: 66 yo F presented w chest/back/shoulder pain x2 days found to have unstable angina. no anticoagulants PTA per fill hx. Pt had NSTEMI 11/01/22 s/p LHC 11/2.  CBC Hgb 10.7/Plt 302  Goal of Therapy:  Heparin level 0.3-0.7 units/ml Monitor platelets by anticoagulation protocol: Yes   Plan:  Heparin bolus 4000 units x1  Heparin at 850 units/hr  6hr HL at 2000  Daily CBC, HL   Wilson Singer, PharmD Clinical Pharmacist 11/19/2022 1:40 PM

## 2022-11-19 NOTE — ED Notes (Signed)
Provider Dr Shellia Carwin notified of the patient's persistent arm pain via secure chat.

## 2022-11-19 NOTE — ED Provider Notes (Signed)
[facility] EMERGENCY DEPARTMENT Provider Note   CSN: [id number] Arrival date & time: 11/19/22  1027     History  Chief Complaint  Patient presents with   Chest Pain    Mercedes Adams is a 66 y.o. female.  With PMH significant for NSTEMI, recent GI bleed, presenting with concerns for chest pain for the past 3 days.  She reports it was initially intermittent but has been constant since last night.  She endorses fatigue.  Denies nausea, vomiting, diaphoresis.  She endorses chest tightness.  She describes the chest pain as aching and localizes it to her anterior chest.  It does not radiate.  She endorses shortness of breath with exertion.  Denies fever, cough, congestion, hemoptysis.  She was evaluated by her cardiologist earlier today.  She reports that she was told to present to the emergency department.  The history is provided by the patient and a relative.  Chest Pain Pain location:  Substernal area Pain quality: aching and tightness   Pain quality: not crushing, no pressure and not tearing   Pain radiates to:  Does not radiate Pain severity:  Moderate Onset quality:  Gradual Duration:  3 days Timing:  Constant Progression:  Worsening Chronicity:  Recurrent Context: not breathing and not trauma   Worsened by:  Nothing Associated symptoms: shortness of breath   Associated symptoms: no abdominal pain, no back pain, no diaphoresis, no fever, no headache, no lower extremity edema, no nausea, no numbness and no weakness        Home Medications Prior to Admission medications   Medication Sig Start Date End Date Taking? Authorizing Provider  alendronate (FOSAMAX) 70 MG tablet Take 70 mg by mouth once a week. 07/27/22   [provider]  alum & mag hydroxide-simeth (MAALOX PLUS) 400-400-40 MG/5ML suspension Take 10 mLs by mouth every 6 (six) hours as needed for indigestion.    [provider]  aspirin EC 81 MG tablet Take 1 tablet (81 mg total) by  mouth daily. Swallow whole. 11/06/22   Tolia, Sunit, DO  atorvastatin (LIPITOR) 80 MG tablet Take 1 tablet (80 mg total) by mouth at bedtime. 11/05/22 02/03/23  Tolia, Sunit, DO  docusate sodium (COLACE) 100 MG capsule Take 1 capsule (100 mg total) by mouth 2 (two) times daily. 11/05/22   Tolia, Sunit, DO  fenofibrate 160 MG tablet Take 1 tablet (160 mg total) by mouth daily. 11/06/22 02/04/23  Tolia, Sunit, DO  folic acid (FOLVITE) 1 MG tablet Take 1 mg by mouth daily. 09/06/17   [provider]  JARDIANCE 25 MG TABS tablet Take 25 mg by mouth daily.    [provider]  levothyroxine (SYNTHROID, LEVOTHROID) 100 MCG tablet Take 100 mcg by mouth daily before breakfast.    [provider]  metFORMIN (GLUCOPHAGE) 1000 MG tablet Take 2,000 mg by mouth in the morning and at bedtime.    [provider]  Methotrexate Sodium (METHOTREXATE, PF,) 50 MG/2ML injection Inject into the vein once a week.    [provider]  metoprolol succinate (TOPROL XL) 25 MG 24 hr tablet Take 1 tablet (25 mg total) by mouth daily. Hold if systolic blood pressure (top number) less than 100 mmHg or pulse less than 60 bpm. 11/05/22 02/03/23  Tolia, Sunit, DO  Multiple Vitamin (MULTIVITAMIN) capsule Take 1 capsule by mouth daily.    [provider]  nitroGLYCERIN (NITROSTAT) 0.4 MG SL tablet Place 1 tablet (0.4 mg total) under the tongue  every 5 (five) minutes x 3 doses as needed for up to 30 doses for chest pain. 11/05/22   Tolia, Sunit, DO  pantoprazole (PROTONIX) 40 MG tablet Take 1 tablet (40 mg total) by mouth 2 (two) times daily. 11/05/22 12/05/22  Tolia, Sunit, DO  pantoprazole (PROTONIX) 40 MG tablet Take 1 tablet (40 mg total) by mouth daily. 12/06/22 03/06/23  Rex Kras, DO      Allergies    Patient has no known allergies.    Review of Systems   Review of Systems  Constitutional:  Negative for diaphoresis and fever.  Respiratory:  Positive for shortness of breath.    Cardiovascular:  Positive for chest pain.  Gastrointestinal:  Negative for abdominal pain and nausea.  Musculoskeletal:  Negative for back pain.  Neurological:  Negative for weakness, numbness and headaches.    Physical Exam Updated Vital Signs BP 118/73 (BP Location: Right Arm)   Pulse 77   Temp 97.9 F (36.6 C)   Resp 15   Ht '5\' 4"'$  (1.626 m)   Wt 79 kg   SpO2 97%   BMI 29.90 kg/m  Physical Exam Vitals and nursing note reviewed.  Constitutional:      General: She is not in acute distress.    Appearance: She is well-developed.  HENT:     Head: Normocephalic and atraumatic.  Eyes:     Conjunctiva/sclera: Conjunctivae normal.  Cardiovascular:     Rate and Rhythm: Normal rate and regular rhythm.     Pulses:          Radial pulses are 2+ on the right side and 2+ on the left side.       Dorsalis pedis pulses are 1+ on the right side and 1+ on the left side.     Heart sounds: Normal heart sounds. No murmur heard. Pulmonary:     Effort: Pulmonary effort is normal. No respiratory distress.     Breath sounds: Normal breath sounds.  Abdominal:     Palpations: Abdomen is soft.     Tenderness: There is no abdominal tenderness.  Musculoskeletal:        General: No swelling.     Cervical back: Neck supple.     Right lower leg: No tenderness. No edema.     Left lower leg: No tenderness. No edema.  Skin:    General: Skin is warm and dry.     Capillary Refill: Capillary refill takes less than 2 seconds.  Neurological:     Mental Status: She is alert.  Psychiatric:        Mood and Affect: Mood normal.     ED Results / Procedures / Treatments   Labs (all labs ordered are listed, but only abnormal results are displayed) Labs Reviewed  CBC - Abnormal; Notable for the following components:      Result Value   Hemoglobin 10.7 (*)    HCT 34.0 (*)    All other components within normal limits  BASIC METABOLIC PANEL  TROPONIN I (HIGH SENSITIVITY)  TROPONIN I (HIGH SENSITIVITY)     EKG None  Radiology DG Chest 2 View  Result Date: 11/19/2022 CLINICAL DATA:  Provided history: Chest pain. EXAM: CHEST - 2 VIEW COMPARISON:  Prior chest radiographs 11/01/2022 and earlier. FINDINGS: Heart size within normal limits. No appreciable airspace consolidation or pulmonary edema. No evidence of pleural effusion or pneumothorax. Degenerative changes of the spine. Surgical clip(s) within the right upper quadrant of the abdomen. IMPRESSION: No evidence of  active cardiopulmonary disease. Electronically Signed   By: Kellie Simmering D.O.   On: 11/19/2022 11:44    Procedures Procedures    Medications Ordered in ED Medications - No data to display  ED Course/ Medical Decision Making/ A&P                           Medical Decision Making Patient has history of recent NSTEMI making her chest pain significantly high risk for ACS.  With history of recent GI bleed, will also rule out anemia as source of chest pain.  Patient is oxygenating well on room air, does not have pleuritic chest pain, is not tachycardic or hypoxic, does not endorse shortness of breath.  I do not think this is PE.  Amount and/or Complexity of Data Reviewed Labs: ordered. Decision-making details documented in ED Course. Radiology: ordered and independent interpretation performed. Decision-making details documented in ED Course. ECG/medicine tests: ordered and independent interpretation performed. Decision-making details documented in ED Course.  Risk Decision regarding hospitalization.   Patient's troponin 7.  Hemoglobin 10.7, which is up from prior.  BMP within normal limits.  EKG: Sinus rhythm, ventricular rate 80 bpm, PR interval 135, QTc 408 ms.  No ST elevations.  Very slight ST depression in lead II, inverted T wave in lead III.  Chest x-ray showed no focal consolidation or opacification, no pneumothorax, no obvious bony injury.  Due to patient's CAD and high risk chest pain, she will be admitted to  cardiology.  I discussed patient with cardiology.  She is admitted to their service.        Final Clinical Impression(s) / ED Diagnoses Final diagnoses:  None    Rx / DC Orders ED Discharge Orders     None         Luster Landsberg, MD 11/19/22 1724    Isla Pence, MD 11/20/22 [phone]

## 2022-11-20 ENCOUNTER — Ambulatory Visit: Payer: Medicare Other | Admitting: Thoracic Surgery (Cardiothoracic Vascular Surgery)

## 2022-11-20 DIAGNOSIS — I2510 Atherosclerotic heart disease of native coronary artery without angina pectoris: Secondary | ICD-10-CM | POA: Diagnosis not present

## 2022-11-20 LAB — CBC WITH DIFFERENTIAL/PLATELET
Abs Immature Granulocytes: 0.01 10*3/uL (ref 0.00–0.07)
Basophils Absolute: 0.1 10*3/uL (ref 0.0–0.1)
Basophils Relative: 1 %
Eosinophils Absolute: 0.2 10*3/uL (ref 0.0–0.5)
Eosinophils Relative: 3 %
HCT: 31.7 % — ABNORMAL LOW (ref 36.0–46.0)
Hemoglobin: 9.7 g/dL — ABNORMAL LOW (ref 12.0–15.0)
Immature Granulocytes: 0 %
Lymphocytes Relative: 33 %
Lymphs Abs: 2.2 10*3/uL (ref 0.7–4.0)
MCH: 27.2 pg (ref 26.0–34.0)
MCHC: 30.6 g/dL (ref 30.0–36.0)
MCV: 88.8 fL (ref 80.0–100.0)
Monocytes Absolute: 0.6 10*3/uL (ref 0.1–1.0)
Monocytes Relative: 8 %
Neutro Abs: 3.7 10*3/uL (ref 1.7–7.7)
Neutrophils Relative %: 55 %
Platelets: 271 10*3/uL (ref 150–400)
RBC: 3.57 MIL/uL — ABNORMAL LOW (ref 3.87–5.11)
RDW: 14 % (ref 11.5–15.5)
WBC: 6.7 10*3/uL (ref 4.0–10.5)
nRBC: 0 % (ref 0.0–0.2)

## 2022-11-20 LAB — BASIC METABOLIC PANEL
Anion gap: 12 (ref 5–15)
BUN: 16 mg/dL (ref 8–23)
CO2: 23 mmol/L (ref 22–32)
Calcium: 9.2 mg/dL (ref 8.9–10.3)
Chloride: 104 mmol/L (ref 98–111)
Creatinine, Ser: 0.84 mg/dL (ref 0.44–1.00)
GFR, Estimated: >60 mL/min (ref >60)
Glucose, Bld: 116 mg/dL — ABNORMAL HIGH (ref 70–99)
Potassium: 3.7 mmol/L (ref 3.5–5.1)
Sodium: 139 mmol/L (ref 135–145)

## 2022-11-20 LAB — PREPARE RBC (CROSSMATCH)

## 2022-11-20 LAB — SURGICAL PCR SCREEN
MRSA, PCR: NEGATIVE
Staphylococcus aureus: POSITIVE — AB

## 2022-11-20 LAB — I-STAT ARTERIAL BLOOD GAS, ED
Acid-Base Excess: 2 mmol/L (ref 0.0–2.0)
Acid-Base Excess: 2 mmol/L (ref 0.0–2.0)
Bicarbonate: 26.3 mmol/L (ref 20.0–28.0)
Bicarbonate: 26 mmol/L (ref 20.0–28.0)
Calcium, Ion: 1.22 mmol/L (ref 1.15–1.40)
Calcium, Ion: 1.21 mmol/L (ref 1.15–1.40)
HCT: 30 % — ABNORMAL LOW (ref 36.0–46.0)
HCT: 30 % — ABNORMAL LOW (ref 36.0–46.0)
Hemoglobin: 10.2 g/dL — ABNORMAL LOW (ref 12.0–15.0)
Hemoglobin: 10.2 g/dL — ABNORMAL LOW (ref 12.0–15.0)
O2 Saturation: 67 %
O2 Saturation: 99 %
Patient temperature: 98
Patient temperature: 98
Potassium: 3.8 mmol/L (ref 3.5–5.1)
Potassium: 3.7 mmol/L (ref 3.5–5.1)
Sodium: 138 mmol/L (ref 135–145)
Sodium: 139 mmol/L (ref 135–145)
TCO2: 28 mmol/L (ref 22–32)
TCO2: 27 mmol/L (ref 22–32)
pCO2 arterial: 39.3 mmHg (ref 32–48)
pCO2 arterial: 38.4 mmHg (ref 32–48)
pH, Arterial: 7.432 (ref 7.35–7.45)
pH, Arterial: 7.438 (ref 7.35–7.45)
pO2, Arterial: 33 mmHg — CL (ref 83–108)
pO2, Arterial: 114 mmHg — ABNORMAL HIGH (ref 83–108)

## 2022-11-20 LAB — URINALYSIS, ROUTINE W REFLEX MICROSCOPIC
Bacteria, UA: NONE SEEN
Bilirubin Urine: NEGATIVE
Glucose, UA: >=500 mg/dL — AB
Hgb urine dipstick: NEGATIVE
Ketones, ur: NEGATIVE mg/dL
Leukocytes,Ua: NEGATIVE
Nitrite: NEGATIVE
Protein, ur: NEGATIVE mg/dL
Specific Gravity, Urine: 1.016 (ref 1.005–1.030)
pH: 5 (ref 5.0–8.0)

## 2022-11-20 LAB — CBG MONITORING, ED: Glucose-Capillary: 120 mg/dL — ABNORMAL HIGH (ref 70–99)

## 2022-11-20 LAB — GLUCOSE, CAPILLARY
Glucose-Capillary: 149 mg/dL — ABNORMAL HIGH (ref 70–99)
Glucose-Capillary: 193 mg/dL — ABNORMAL HIGH (ref 70–99)
Glucose-Capillary: 195 mg/dL — ABNORMAL HIGH (ref 70–99)

## 2022-11-20 LAB — HEPARIN LEVEL (UNFRACTIONATED)
Heparin Unfractionated: 0.26 IU/mL — ABNORMAL LOW (ref 0.30–0.70)
Heparin Unfractionated: 0.3 IU/mL (ref 0.30–0.70)

## 2022-11-20 LAB — SARS CORONAVIRUS 2 BY RT PCR: SARS Coronavirus 2 by RT PCR: NEGATIVE

## 2022-11-20 MED ORDER — CEFAZOLIN SODIUM-DEXTROSE 2-4 GM/100ML-% IV SOLN
2.0000 g | INTRAVENOUS | Status: AC
  Administered 2022-11-21: 2 g via INTRAVENOUS
  Filled 2022-11-20: qty 100

## 2022-11-20 MED ORDER — PLASMA-LYTE A IV SOLN
INTRAVENOUS | Status: DC
  Filled 2022-11-20: qty 2.5

## 2022-11-20 MED ORDER — CHLORHEXIDINE GLUCONATE 4 % EX LIQD
60.0000 mL | Freq: Once | CUTANEOUS | Status: AC
  Administered 2022-11-20: 4 via TOPICAL
  Filled 2022-11-20: qty 60

## 2022-11-20 MED ORDER — TRANEXAMIC ACID (OHS) PUMP PRIME SOLUTION
2.0000 mg/kg | INTRAVENOUS | Status: DC
  Filled 2022-11-20: qty 1.58

## 2022-11-20 MED ORDER — PHENYLEPHRINE HCL-NACL 20-0.9 MG/250ML-% IV SOLN
30.0000 ug/min | INTRAVENOUS | Status: AC
  Administered 2022-11-21: 30 ug/min via INTRAVENOUS
  Filled 2022-11-20: qty 250

## 2022-11-20 MED ORDER — INSULIN REGULAR(HUMAN) IN NACL 100-0.9 UT/100ML-% IV SOLN
INTRAVENOUS | Status: AC
  Administered 2022-11-21: 1 [IU]/h via INTRAVENOUS
  Filled 2022-11-20: qty 100

## 2022-11-20 MED ORDER — CHLORHEXIDINE GLUCONATE 4 % EX LIQD
60.0000 mL | Freq: Once | CUTANEOUS | Status: AC
  Administered 2022-11-21: 4 via TOPICAL
  Filled 2022-11-20: qty 60

## 2022-11-20 MED ORDER — TEMAZEPAM 7.5 MG PO CAPS
15.0000 mg | ORAL_CAPSULE | Freq: Once | ORAL | Status: AC | PRN
  Administered 2022-11-20: 15 mg via ORAL
  Filled 2022-11-20: qty 2

## 2022-11-20 MED ORDER — BISACODYL 5 MG PO TBEC
5.0000 mg | DELAYED_RELEASE_TABLET | Freq: Once | ORAL | Status: AC
  Administered 2022-11-20: 5 mg via ORAL
  Filled 2022-11-20: qty 1

## 2022-11-20 MED ORDER — EPINEPHRINE HCL 5 MG/250ML IV SOLN IN NS
0.0000 ug/min | INTRAVENOUS | Status: DC
  Filled 2022-11-20: qty 250

## 2022-11-20 MED ORDER — DIAZEPAM 5 MG PO TABS
5.0000 mg | ORAL_TABLET | Freq: Once | ORAL | Status: AC
  Administered 2022-11-21: 5 mg via ORAL
  Filled 2022-11-20: qty 1

## 2022-11-20 MED ORDER — DEXMEDETOMIDINE HCL IN NACL 400 MCG/100ML IV SOLN
0.1000 ug/kg/h | INTRAVENOUS | Status: AC
  Administered 2022-11-21: .5 ug/kg/h via INTRAVENOUS
  Filled 2022-11-20: qty 100

## 2022-11-20 MED ORDER — HEPARIN 30,000 UNITS/1000 ML (OHS) CELLSAVER SOLUTION
Status: DC
  Filled 2022-11-20: qty 1000

## 2022-11-20 MED ORDER — NOREPINEPHRINE 4 MG/250ML-% IV SOLN
0.0000 ug/min | INTRAVENOUS | Status: DC
  Filled 2022-11-20: qty 250

## 2022-11-20 MED ORDER — VANCOMYCIN HCL 1250 MG/250ML IV SOLN
1250.0000 mg | INTRAVENOUS | Status: AC
  Administered 2022-11-21: 1250 mg via INTRAVENOUS
  Filled 2022-11-20: qty 250

## 2022-11-20 MED ORDER — TRANEXAMIC ACID 1000 MG/10ML IV SOLN
1.5000 mg/kg/h | INTRAVENOUS | Status: AC
  Administered 2022-11-21: 1.5 mg/kg/h via INTRAVENOUS
  Filled 2022-11-20: qty 25

## 2022-11-20 MED ORDER — MILRINONE LACTATE IN DEXTROSE 20-5 MG/100ML-% IV SOLN
0.3000 ug/kg/min | INTRAVENOUS | Status: AC
  Administered 2022-11-21: .25 ug/kg/min via INTRAVENOUS
  Filled 2022-11-20: qty 100

## 2022-11-20 MED ORDER — TRANEXAMIC ACID (OHS) BOLUS VIA INFUSION
15.0000 mg/kg | INTRAVENOUS | Status: AC
  Administered 2022-11-21: 1185 mg via INTRAVENOUS
  Filled 2022-11-20: qty 1185

## 2022-11-20 MED ORDER — POTASSIUM CHLORIDE 2 MEQ/ML IV SOLN
80.0000 meq | INTRAVENOUS | Status: DC
  Filled 2022-11-20: qty 40

## 2022-11-20 MED ORDER — MAGNESIUM SULFATE 50 % IJ SOLN
40.0000 meq | INTRAMUSCULAR | Status: DC
  Filled 2022-11-20: qty 9.85

## 2022-11-20 MED ORDER — ALPRAZOLAM 0.25 MG PO TABS: 0.2500 mg | ORAL_TABLET | ORAL | Status: DC | PRN

## 2022-11-20 MED ORDER — CHLORHEXIDINE GLUCONATE 0.12 % MT SOLN
15.0000 mL | Freq: Once | OROMUCOSAL | Status: AC
  Administered 2022-11-21: 15 mL via OROMUCOSAL
  Filled 2022-11-20: qty 15

## 2022-11-20 MED ORDER — METOPROLOL TARTRATE 12.5 MG HALF TABLET
12.5000 mg | ORAL_TABLET | Freq: Once | ORAL | Status: AC
  Administered 2022-11-21: 12.5 mg via ORAL
  Filled 2022-11-20: qty 1

## 2022-11-20 NOTE — ED Notes (Signed)
Patient denies pain and is resting comfortably.  

## 2022-11-20 NOTE — Progress Notes (Addendum)
Pt received OHS book, OHS careguide, and IS. IS baseline 1265m. Pt educated on sternal precautions. All questions answered. Pt will not have 24/7 supervision when d/c.

## 2022-11-20 NOTE — ED Notes (Signed)
Patient made aware of pending urine sample. Per patient "Oh I wasn't told that". Patient and family member set to notify RN or NT when urine same has been provided.

## 2022-11-20 NOTE — ED Notes (Signed)
ED TO INPATIENT HANDOFF REPORT  ED Nurse Name and Phone #: Dessa Phi  S Name/Age/Gender Mercedes Adams 66 y.o. female Room/Bed: 013C/013C  Code Status   Code Status: Full Code  Home/SNF/Other Home Patient oriented to: self, place, time, and situation Is this baseline? Yes   Triage Complete: Triage complete  Chief Complaint Unstable angina ([place]) [I20.0]  Triage Note Patient here with complaint of chest, back, and left shoulder pain that started two days ago as well as nausea. Patient is alert, oriented, speaking in complete sentences, and is in no apparent distress at this time.   Allergies No Known Allergies  Level of Care/Admitting Diagnosis ED Disposition     ED Disposition  Admit   Condition  --   Comment  Hospital Area: [place] [[id number]]  Level of Care: Telemetry Cardiac [[id number]]  May admit patient to [facility] or [facility] if equivalent level of care is available:: Yes  Covid Evaluation: Asymptomatic - no recent exposure (last 10 days) testing not required  Diagnosis: Unstable angina [facility]) [[id number]]  Admitting Physician: Floydene Flock [[id number]]  Attending Physician: Floydene Flock [[id number]]  Certification:: I certify this patient will need inpatient services for at least 2 midnights  Estimated Length of Stay: 3          B Medical/Surgery History Past Medical History:  Diagnosis Date   Anxiety    Arthritis    Diabetes mellitus without complication ([place])    Heart murmur    Hyperlipidemia    Hypertension    Thyroid disease    Past Surgical History:  Procedure Laterality Date   APPENDECTOMY     BIOPSY  11/02/2022   Procedure: BIOPSY;  Surgeon: Jackquline Denmark, MD;  Location: [facility];  Service: Gastroenterology;;   CESAREAN SECTION     x 2   ESOPHAGOGASTRODUODENOSCOPY (EGD) WITH PROPOFOL N/A 11/02/2022   Procedure: ESOPHAGOGASTRODUODENOSCOPY (EGD) WITH PROPOFOL;  Surgeon: Jackquline Denmark, MD;  Location: [facility];   Service: Gastroenterology;  Laterality: N/A;   HEMOSTASIS CLIP PLACEMENT  11/02/2022   Procedure: HEMOSTASIS CLIP PLACEMENT;  Surgeon: Jackquline Denmark, MD;  Location: [facility] ENDOSCOPY;  Service: Gastroenterology;;   HEMOSTASIS CONTROL  11/02/2022   Procedure: HEMOSTASIS CONTROL;  Surgeon: Jackquline Denmark, MD;  Location: [facility] ENDOSCOPY;  Service: Gastroenterology;;   LEFT HEART CATH AND CORONARY ANGIOGRAPHY N/A 11/01/2022   Procedure: LEFT HEART CATH AND CORONARY ANGIOGRAPHY;  Surgeon: Adrian Prows, MD;  Location: [facility] CV LAB;  Service: Cardiovascular;  Laterality: N/A;   REPLACEMENT TOTAL KNEE     left knee   SUBMUCOSAL INJECTION  11/02/2022   Procedure: SUBMUCOSAL INJECTION;  Surgeon: Jackquline Denmark, MD;  Location: [facility] ENDOSCOPY;  Service: Gastroenterology;;     A IV Location/Drains/Wounds Patient Lines/Drains/Airways Status     Active Line/Drains/Airways     Name Placement date Placement time Site Days   Peripheral IV 11/19/22 20 G Right Antecubital 11/19/22  1353  Antecubital  1   Peripheral IV 11/20/22 22 G Posterior;Right Forearm 11/20/22  0558  Forearm  less than 1            Intake/Output Last 24 hours  Intake/Output Summary (Last 24 hours) at 11/20/2022 1128 Last data filed at 11/20/2022 3419 Gross per 24 hour  Intake 50.31 ml  Output --  Net 50.31 ml    Labs/Imaging Results for orders placed or performed during the hospital encounter of 11/19/22 (from the past 48 hour(s))  Basic metabolic panel     Status: None  Collection Time: 11/19/22 10:58 AM  Result Value Ref Range   Sodium 140 135 - 145 mmol/L   Potassium 4.1 3.5 - 5.1 mmol/L   Chloride 105 98 - 111 mmol/L   CO2 25 22 - 32 mmol/L   Glucose, Bld 91 70 - 99 mg/dL    Comment: Glucose reference range applies only to samples taken after fasting for at least 8 hours.   BUN 17 8 - 23 mg/dL   Creatinine, Ser 0.92 0.44 - 1.00 mg/dL   Calcium 9.0 8.9 - 10.3 mg/dL   GFR, Estimated >60 >60 mL/min    Comment:  (NOTE) Calculated using the CKD-EPI Creatinine Equation (2021)    Anion gap 10 5 - 15    Comment: Performed at [facility] [address] [place], [place] [address]  CBC     Status: Abnormal   Collection Time: 11/19/22 10:58 AM  Result Value Ref Range   WBC 9.2 4.0 - 10.5 K/uL   RBC 3.88 3.87 - 5.11 MIL/uL   Hemoglobin 10.7 (L) 12.0 - 15.0 g/dL   HCT 34.0 (L) 36.0 - 46.0 %   MCV 87.6 80.0 - 100.0 fL   MCH 27.6 26.0 - 34.0 pg   MCHC 31.5 30.0 - 36.0 g/dL   RDW 14.1 11.5 - 15.5 %   Platelets 302 150 - 400 K/uL   nRBC 0.0 0.0 - 0.2 %    Comment: Performed at [facility], [place] [address] [place], [place] [address]  Troponin I (High Sensitivity)     Status: None   Collection Time: 11/19/22 10:58 AM  Result Value Ref Range   Troponin I (High Sensitivity) 7 <18 ng/L    Comment: (NOTE) Elevated high sensitivity troponin I (hsTnI) values and significant  changes across serial measurements may suggest ACS but many other  chronic and acute conditions are known to elevate hsTnI results.  Refer to the "Links" section for chest pain algorithms and additional  guidance. Performed at [facility], [place] [address], [place], [place] [address]   Troponin I (High Sensitivity)     Status: None   Collection Time: 11/19/22 12:51 PM  Result Value Ref Range   Troponin I (High Sensitivity) 7 <18 ng/L    Comment: (NOTE) Elevated high sensitivity troponin I (hsTnI) values and significant  changes across serial measurements may suggest ACS but many other  chronic and acute conditions are known to elevate hsTnI results.  Refer to the "Links" section for chest pain algorithms and additional  guidance. Performed at [facility], [place] [address], [place], [place] [address]   Comprehensive metabolic panel     Status: None   Collection Time: 11/19/22  1:35 PM  Result Value Ref Range   Sodium 140 135 - 145 mmol/L   Potassium 4.0 3.5 - 5.1 mmol/L   Chloride 104 98 - 111 mmol/L    CO2 23 22 - 32 mmol/L   Glucose, Bld 96 70 - 99 mg/dL    Comment: Glucose reference range applies only to samples taken after fasting for at least 8 hours.   BUN 17 8 - 23 mg/dL   Creatinine, Ser 0.89 0.44 - 1.00 mg/dL   Calcium 9.3 8.9 - 10.3 mg/dL   Total Protein 7.0 6.5 - 8.1 g/dL   Albumin 4.0 3.5 - 5.0 g/dL   AST 17 15 - 41 U/L   ALT 13 0 - 44 U/L   Alkaline Phosphatase 47 38 -  126 U/L   Total Bilirubin 0.4 0.3 - 1.2 mg/dL   GFR, Estimated >60 >60 mL/min    Comment: (NOTE) Calculated using the CKD-EPI Creatinine Equation (2021)    Anion gap 13 5 - 15    Comment: Performed at [facility] [address] [place], [place] [address]  TSH     Status: None   Collection Time: 11/19/22  1:35 PM  Result Value Ref Range   TSH 2.254 0.350 - 4.500 uIU/mL    Comment: Performed by a 3rd Generation assay with a functional sensitivity of <=0.01 uIU/mL. Performed at [facility], [place] [address] [place], [place] [address]   CBG monitoring, ED     Status: None   Collection Time: 11/19/22  1:50 PM  Result Value Ref Range   Glucose-Capillary 97 70 - 99 mg/dL    Comment: Glucose reference range applies only to samples taken after fasting for at least 8 hours.  CBG monitoring, ED     Status: Abnormal   Collection Time: 11/19/22  5:07 PM  Result Value Ref Range   Glucose-Capillary 149 (H) 70 - 99 mg/dL    Comment: Glucose reference range applies only to samples taken after fasting for at least 8 hours.  CBG monitoring, ED     Status: Abnormal   Collection Time: 11/19/22 10:18 PM  Result Value Ref Range   Glucose-Capillary 155 (H) 70 - 99 mg/dL    Comment: Glucose reference range applies only to samples taken after fasting for at least 8 hours.  Heparin level (unfractionated)     Status: Abnormal   Collection Time: 11/19/22 11:16 PM  Result Value Ref Range   Heparin Unfractionated <0.10 (L) 0.30 - 0.70 IU/mL    Comment: (NOTE) The clinical reportable range upper limit is  being lowered to >1.10 to align with the FDA approved guidance for the current laboratory assay.  If heparin results are below expected values, and patient dosage has  been confirmed, suggest follow up testing of antithrombin III levels. Performed at [facility], [place] [address], [place], [place] [address]   CBC with Differential/Platelet     Status: Abnormal   Collection Time: 11/20/22  3:52 AM  Result Value Ref Range   WBC 6.7 4.0 - 10.5 K/uL   RBC 3.57 (L) 3.87 - 5.11 MIL/uL   Hemoglobin 9.7 (L) 12.0 - 15.0 g/dL   HCT 31.7 (L) 36.0 - 46.0 %   MCV 88.8 80.0 - 100.0 fL   MCH 27.2 26.0 - 34.0 pg   MCHC 30.6 30.0 - 36.0 g/dL   RDW 14.0 11.5 - 15.5 %   Platelets 271 150 - 400 K/uL   nRBC 0.0 0.0 - 0.2 %   Neutrophils Relative % 55 %   Neutro Abs 3.7 1.7 - 7.7 K/uL   Lymphocytes Relative 33 %   Lymphs Abs 2.2 0.7 - 4.0 K/uL   Monocytes Relative 8 %   Monocytes Absolute 0.6 0.1 - 1.0 K/uL   Eosinophils Relative 3 %   Eosinophils Absolute 0.2 0.0 - 0.5 K/uL   Basophils Relative 1 %   Basophils Absolute 0.1 0.0 - 0.1 K/uL   Immature Granulocytes 0 %   Abs Immature Granulocytes 0.01 0.00 - 0.07 K/uL    Comment: Performed at [facility] [address], [place], [place] [address]  Basic metabolic panel     Status: Abnormal   Collection Time: 11/20/22  3:52 AM  Result  Value Ref Range   Sodium 139 135 - 145 mmol/L   Potassium 3.7 3.5 - 5.1 mmol/L   Chloride 104 98 - 111 mmol/L   CO2 23 22 - 32 mmol/L   Glucose, Bld 116 (H) 70 - 99 mg/dL    Comment: Glucose reference range applies only to samples taken after fasting for at least 8 hours.   BUN 16 8 - 23 mg/dL   Creatinine, Ser 0.84 0.44 - 1.00 mg/dL   Calcium 9.2 8.9 - 10.3 mg/dL   GFR, Estimated >60 >60 mL/min    Comment: (NOTE) Calculated using the CKD-EPI Creatinine Equation (2021)    Anion gap 12 5 - 15    Comment: Performed at [facility] [address], [place], [place] [address]  Type and screen  [place]     Status: None   Collection Time: 11/20/22  3:52 AM  Result Value Ref Range   ABO/RH(D) O NEG    Antibody Screen NEG    Sample Expiration      11/23/2022,2359 Performed at [facility], [place] [address], [place], [place] [address]   I-Stat arterial blood gas, ED     Status: Abnormal   Collection Time: 11/20/22  5:26 AM  Result Value Ref Range   pH, Arterial 7.432 7.35 - 7.45   pCO2 arterial 39.3 32 - 48 mmHg   pO2, Arterial 33 (LL) 83 - 108 mmHg   Bicarbonate 26.3 20.0 - 28.0 mmol/L   TCO2 28 22 - 32 mmol/L   O2 Saturation 67 %   Acid-Base Excess 2.0 0.0 - 2.0 mmol/L   Sodium 138 135 - 145 mmol/L   Potassium 3.8 3.5 - 5.1 mmol/L   Calcium, Ion 1.22 1.15 - 1.40 mmol/L   HCT 30.0 (L) 36.0 - 46.0 %   Hemoglobin 10.2 (L) 12.0 - 15.0 g/dL   Patient temperature 98.0 F    Collection site RADIAL, ALLEN'S TEST ACCEPTABLE    Drawn by RT    Sample type ARTERIAL    Comment NOTIFIED PHYSICIAN   I-Stat arterial blood gas, ED     Status: Abnormal   Collection Time: 11/20/22  5:38 AM  Result Value Ref Range   pH, Arterial 7.438 7.35 - 7.45   pCO2 arterial 38.4 32 - 48 mmHg   pO2, Arterial 114 (H) 83 - 108 mmHg   Bicarbonate 26.0 20.0 - 28.0 mmol/L   TCO2 27 22 - 32 mmol/L   O2 Saturation 99 %   Acid-Base Excess 2.0 0.0 - 2.0 mmol/L   Sodium 139 135 - 145 mmol/L   Potassium 3.7 3.5 - 5.1 mmol/L   Calcium, Ion 1.21 1.15 - 1.40 mmol/L   HCT 30.0 (L) 36.0 - 46.0 %   Hemoglobin 10.2 (L) 12.0 - 15.0 g/dL   Patient temperature 98.0 F    Collection site RADIAL, ALLEN'S TEST ACCEPTABLE    Drawn by RT    Sample type ARTERIAL   CBG monitoring, ED     Status: Abnormal   Collection Time: 11/20/22  7:55 AM  Result Value Ref Range   Glucose-Capillary 120 (H) 70 - 99 mg/dL    Comment: Glucose reference range applies only to samples taken after fasting for at least 8 hours.  Heparin level (unfractionated)     Status: Abnormal   Collection Time: 11/20/22  7:56  AM  Result Value Ref Range   Heparin Unfractionated 0.26 (L) 0.30 - 0.70 IU/mL  Comment: (NOTE) The clinical reportable range upper limit is being lowered to >1.10 to align with the FDA approved guidance for the current laboratory assay.  If heparin results are below expected values, and patient dosage has  been confirmed, suggest follow up testing of antithrombin III levels. Performed at [facility], [place] [address], [place], [place] [address]   Urinalysis, Routine w reflex microscopic Urine, Clean Catch     Status: Abnormal   Collection Time: 11/20/22  8:59 AM  Result Value Ref Range   Color, Urine YELLOW YELLOW   APPearance CLEAR CLEAR   Specific Gravity, Urine 1.016 1.005 - 1.030   pH 5.0 5.0 - 8.0   Glucose, UA >=500 (A) NEGATIVE mg/dL   Hgb urine dipstick NEGATIVE NEGATIVE   Bilirubin Urine NEGATIVE NEGATIVE   Ketones, ur NEGATIVE NEGATIVE mg/dL   Protein, ur NEGATIVE NEGATIVE mg/dL   Nitrite NEGATIVE NEGATIVE   Leukocytes,Ua NEGATIVE NEGATIVE   RBC / HPF 0-5 0 - 5 RBC/hpf   WBC, UA 0-5 0 - 5 WBC/hpf   Bacteria, UA NONE SEEN NONE SEEN   Squamous Epithelial / LPF 0-5 0 - 5   Mucus PRESENT     Comment: Performed at [facility], [place] [address] [place], [place] [address]   DG Chest 2 View  Result Date: 11/19/2022 CLINICAL DATA:  Provided history: Chest pain. EXAM: CHEST - 2 VIEW COMPARISON:  Prior chest radiographs 11/01/2022 and earlier. FINDINGS: Heart size within normal limits. No appreciable airspace consolidation or pulmonary edema. No evidence of pleural effusion or pneumothorax. Degenerative changes of the spine. Surgical clip(s) within the right upper quadrant of the abdomen. IMPRESSION: No evidence of active cardiopulmonary disease. Electronically Signed   By: Kellie Simmering D.O.   On: 11/19/2022 11:44    Pending Labs Unresulted Labs (From admission, onward)     Start     Ordered   11/20/22 1500  Heparin level (unfractionated)  Once-Timed,   TIMED         11/20/22 0910   11/20/22 0500  CBC with Differential/Platelet  Daily at 5am,   R      11/19/22 1336   11/20/22 2841  Basic metabolic panel  Daily at 5am,   R      11/19/22 1336   11/20/22 0500  Blood gas, arterial  Tomorrow morning,   R       Question:  Room air or oxygen  Answer:  Oxygen   11/19/22 1632   11/19/22 1632  Surgical pcr screen  Once,   R        11/19/22 1632            Vitals/Pain Today's Vitals   11/20/22 0800 11/20/22 0906 11/20/22 1000 11/20/22 1100  BP: 113/67  (!) 108/58 118/64  Pulse: 70  72 80  Resp: '14  18 20  '$ Temp:  (!) 97.5 F (36.4 C)    TempSrc:      SpO2: 100%  100% 98%  Weight:      Height:      PainSc:        Isolation Precautions No active isolations  Medications Medications  aspirin chewable tablet 324 mg (324 mg Oral Not Given 11/19/22 1406)    Or  aspirin suppository 300 mg ( Rectal See Alternative 11/19/22 1406)  aspirin EC tablet 81 mg (81 mg Oral Given 11/20/22 0847)  acetaminophen (TYLENOL) tablet 650 mg (650 mg Oral Given 11/20/22 0631)  ondansetron (ZOFRAN) injection  4 mg (has no administration in time range)  nitroGLYCERIN 50 mg in dextrose 5 % 250 mL (0.2 mg/mL) infusion (10 mcg/min Intravenous Infusion Verify 11/20/22 0925)  atorvastatin (LIPITOR) tablet 80 mg (80 mg Oral Not Given 11/19/22 2140)  docusate sodium (COLACE) capsule 100 mg (100 mg Oral Given 11/20/22 0848)  fenofibrate tablet 160 mg (has no administration in time range)  empagliflozin (JARDIANCE) tablet 25 mg (25 mg Oral Given 11/19/22 1824)  levothyroxine (SYNTHROID) tablet 100 mcg (100 mcg Oral Given 11/20/22 0516)  metoprolol succinate (TOPROL-XL) 24 hr tablet 25 mg (0 mg Oral Hold 11/20/22 1010)  pantoprazole (PROTONIX) EC tablet 40 mg (40 mg Oral Given 11/20/22 0847)  insulin aspart (novoLOG) injection 0-15 Units ( Subcutaneous Not Given 11/20/22 0758)  insulin aspart (novoLOG) injection 0-5 Units ( Subcutaneous Not Given 11/19/22 2222)  insulin  aspart (novoLOG) injection 4 Units (4 Units Subcutaneous Not Given 11/20/22 0849)  heparin ADULT infusion 100 units/mL (25000 units/258m) (1,100 Units/hr Intravenous New Bag/Given 11/20/22 1058)  feeding supplement (GLUCERNA SHAKE) (GLUCERNA SHAKE) liquid 237 mL (237 mLs Oral Given 11/20/22 0847)  heparin bolus via infusion 4,000 Units (4,000 Units Intravenous Bolus from Bag 11/19/22 1411)  heparin bolus via infusion 2,000 Units (2,000 Units Intravenous Bolus from Bag 11/19/22 2350)    Mobility walks Low fall risk   Focused Assessments    R Recommendations: See Admitting Provider Note  Report given to:   Additional Notes:

## 2022-11-20 NOTE — Progress Notes (Signed)
Subjective:  No chest pain Comfortable this morning   Current Facility-Administered Medications:    acetaminophen (TYLENOL) tablet 650 mg, 650 mg, Oral, Q4H PRN, Custovic, Sabina, DO, 650 mg at 11/20/22 0631   aspirin chewable tablet 324 mg, 324 mg, Oral, NOW **OR** aspirin suppository 300 mg, 300 mg, Rectal, NOW, Custovic, Sabina, DO   aspirin EC tablet 81 mg, 81 mg, Oral, Daily, Custovic, Sabina, DO   atorvastatin (LIPITOR) tablet 80 mg, 80 mg, Oral, QHS, Custovic, Sabina, DO, 80 mg at 11/19/22 1823   docusate sodium (COLACE) capsule 100 mg, 100 mg, Oral, BID, Custovic, Sabina, DO, 100 mg at 11/19/22 1823   empagliflozin (JARDIANCE) tablet 25 mg, 25 mg, Oral, Daily, Custovic, Sabina, DO, 25 mg at 11/19/22 1824   feeding supplement (GLUCERNA SHAKE) (GLUCERNA SHAKE) liquid 237 mL, 237 mL, Oral, TID WC, Dahlia Byes, MD   fenofibrate tablet 160 mg, 160 mg, Oral, Daily, Custovic, Sabina, DO   heparin ADULT infusion 100 units/mL (25000 units/258m), 1,000 Units/hr, Intravenous, Continuous, LErenest Blank RPH, Last Rate: 10 mL/hr at 11/20/22 0759, 1,000 Units/hr at 11/20/22 0759   insulin aspart (novoLOG) injection 0-15 Units, 0-15 Units, Subcutaneous, TID WC, Custovic, Sabina, DO, 2 Units at 11/19/22 1824   insulin aspart (novoLOG) injection 0-5 Units, 0-5 Units, Subcutaneous, QHS, Custovic, Sabina, DO   insulin aspart (novoLOG) injection 4 Units, 4 Units, Subcutaneous, TID WC, Custovic, Sabina, DO, 4 Units at 11/19/22 1824   levothyroxine (SYNTHROID) tablet 100 mcg, 100 mcg, Oral, Q0600, Custovic, Sabina, DO, 100 mcg at 11/20/22 0516   metoprolol succinate (TOPROL-XL) 24 hr tablet 25 mg, 25 mg, Oral, Daily, Custovic, Sabina, DO   nitroGLYCERIN 50 mg in dextrose 5 % 250 mL (0.2 mg/mL) infusion, 0-30 mcg/min, Intravenous, Titrated, Taunja Brickner J, MD, Last Rate: 3 mL/hr at 11/20/22 0044, 10 mcg/min at 11/20/22 0044   ondansetron (ZOFRAN) injection 4 mg, 4 mg, Intravenous, Q6H PRN,  Custovic, Sabina, DO   pantoprazole (PROTONIX) EC tablet 40 mg, 40 mg, Oral, BID, Custovic, Sabina, DO, 40 mg at 11/19/22 2350  Current Outpatient Medications:    alendronate (FOSAMAX) 70 MG tablet, Take 70 mg by mouth once a week., Disp: , Rfl:    alum & mag hydroxide-simeth (MAALOX PLUS) 400-400-40 MG/5ML suspension, Take 10 mLs by mouth every 6 (six) hours as needed for indigestion., Disp: , Rfl:    aspirin EC 81 MG tablet, Take 1 tablet (81 mg total) by mouth daily. Swallow whole., Disp: 30 tablet, Rfl: 12   atorvastatin (LIPITOR) 80 MG tablet, Take 1 tablet (80 mg total) by mouth at bedtime., Disp: 90 tablet, Rfl: 0   docusate sodium (COLACE) 100 MG capsule, Take 1 capsule (100 mg total) by mouth 2 (two) times daily., Disp: 10 capsule, Rfl: 0   fenofibrate 160 MG tablet, Take 1 tablet (160 mg total) by mouth daily., Disp: 90 tablet, Rfl: 0   folic acid (FOLVITE) 1 MG tablet, Take 1 mg by mouth daily., Disp: , Rfl: 0   JARDIANCE 25 MG TABS tablet, Take 25 mg by mouth daily., Disp: , Rfl:    levothyroxine (SYNTHROID, LEVOTHROID) 100 MCG tablet, Take 100 mcg by mouth daily before breakfast., Disp: , Rfl:    metFORMIN (GLUCOPHAGE) 1000 MG tablet, Take 2,000 mg by mouth in the morning and at bedtime., Disp: , Rfl:    Methotrexate Sodium (METHOTREXATE, PF,) 50 MG/2ML injection, Inject into the vein once a week., Disp: , Rfl:    metoprolol succinate (TOPROL XL) 25 MG  24 hr tablet, Take 1 tablet (25 mg total) by mouth daily. Hold if systolic blood pressure (top number) less than 100 mmHg or pulse less than 60 bpm., Disp: 90 tablet, Rfl: 0   Multiple Vitamin (MULTIVITAMIN) capsule, Take 1 capsule by mouth daily., Disp: , Rfl:    nitroGLYCERIN (NITROSTAT) 0.4 MG SL tablet, Place 1 tablet (0.4 mg total) under the tongue every 5 (five) minutes x 3 doses as needed for up to 30 doses for chest pain., Disp: 30 tablet, Rfl: 0   ondansetron (ZOFRAN) 4 MG tablet, Take 4 mg by mouth 3 (three) times daily as  needed for nausea or vomiting., Disp: , Rfl:    pantoprazole (PROTONIX) 40 MG tablet, Take 1 tablet (40 mg total) by mouth 2 (two) times daily., Disp: 60 tablet, Rfl: 0   [START ON 12/06/2022] pantoprazole (PROTONIX) 40 MG tablet, Take 1 tablet (40 mg total) by mouth daily., Disp: 30 tablet, Rfl: 2  Objective:  Vital Signs in the last 24 hours: Temp:  [97.6 F (36.4 C)-98.4 F (36.9 C)] 97.6 F (36.4 C) (11/21 0500) Pulse Rate:  [64-98] 72 (11/21 0700) Resp:  [7-27] 19 (11/21 0700) BP: (98-145)/(44-84) 135/77 (11/21 0700) SpO2:  [91 %-100 %] 100 % (11/21 0700) Weight:  [78.8 kg-79 kg] 79 kg (11/20 1240)  Intake/Output from previous day: 11/20 0701 - 11/21 0700 In: 25.3 [I.V.:25.3] Out: -   Physical Exam Vitals and nursing note reviewed.  Constitutional:      General: She is not in acute distress. Neck:     Vascular: No JVD.  Cardiovascular:     Rate and Rhythm: Normal rate and regular rhythm.     Heart sounds: Normal heart sounds. No murmur heard. Pulmonary:     Effort: Pulmonary effort is normal.     Breath sounds: Normal breath sounds. No wheezing or rales.  Musculoskeletal:     Right lower leg: No edema.     Left lower leg: No edema.      Imaging/tests reviewed and independently interpreted: CXR 11/19/2022  Cardiac Studies:  Telemetry 11/20/2022: No significant arrhtymia  EKG 11/19/2022: Sinus rhythm Nonspecific T wave abnormality, inferior leads  Echocardiogram 11/02/2022:  1. Definity was used for evaluation of wallmotion and EF estimation. Left  ventricular ejection fraction, by estimation, is 50 to 55%. Left  ventricular ejection fraction by 2D MOD biplane is 54.7 %. The left  ventricle has low normal function. The left  ventricle demonstrates regional wall motion abnormalities (see scoring  diagram/findings for description). There is mild left ventricular  hypertrophy. Left ventricular diastolic parameters are consistent with  Grade II diastolic  dysfunction  (pseudonormalization). Elevated left ventricular end-diastolic pressure.  There is mild hypokinesis of the left ventricular, entire inferior wall.   2. Right ventricular systolic function is normal. The right ventricular  size is normal. There is normal pulmonary artery systolic pressure. The  estimated right ventricular systolic pressure is 72.5 mmHg.   3. Left atrial size was mildly dilated.   4. The mitral valve is normal in structure. Mild to moderate mitral valve  regurgitation.   5. The aortic valve was not well visualized. Aortic valve regurgitation  is not visualized. Aortic valve sclerosis/calcification is present,  without any evidence of aortic stenosis.   6. The inferior vena cava is normal in size with greater than 50%  respiratory variability, suggesting right atrial pressure of 3 mmHg.    Coronary angiography 11/01/2022: Left Heart Catheterization 11/01/22:  LV: 70/0, EDP 11  mmHg.  Aortic pressure 72/44, mean 58 mmHg.  Patient received 500 cc bolus with immediate normalization of blood pressure, 90/66 mmHg. LVEF moderate to severely reduced at 30 to 35%, inferior and septal severe hypokinesis and global hypokinesis. LM: Large vessel, mildly calcified. LAD: Large vessel, gives origin to a moderate-sized D2 with ostial 90% stenosis.  Proximal to D2 there is a focal 90 to 95% stenosis in the LAD.  Mid segment has a 80% stenosis and is diffusely diseased in the mid to distal segment all the way to the apex and moderately diffusely calcified. LCx: Moderate to large caliber vessel, again mild amount of calcification noted throughout the vessel.  Proximal segment has a focal 80% stenosis. RI: Moderate caliber vessel.  Mildly tortuous in the midsegment.  Mild disease. RCA: Very large caliber vessel.  Occluded in the mid segment of very small RV branch.  It has contralateral collaterals from the LAD and circumflex.  There appears to be microchannel's with filling of the RCA  all the way to very large PDA and PL branches.  PL has about a 60% proximal stenosis and PDA has about a 30 to 40% stenosis.      Impression: Severe multivessel coronary artery disease.  Moderate to severely reduced LVEF, EF anywhere from 30 to 35%, would ideally be a CABG candidate especially in view of GI bleed and difficulty in dual antiplatelet therapy long-term.  However for now we will manage her medically, obtain the CTS consult electively, do not think that the LAD is bypassable in view of severe disease.  Large RCA with large PDA and PL branch, fairly moderate-sized D2 and large CX are bypassable.     Assessment & Recommendations:  66 y/o female with hypertension, hyperlipidemia, type 2 DM, multivessel CAD, blood loss anemia  Unstable agnina: Pain controlled on IV NTG Seen by CVTS. Plan for CABG Wednesday 11/22. Appreciate their help. Continue Aspirin, heparin, lipitor, metoprolol.  Type 2 DM: On Jardiance. Hold metformin. SSI insulin as needed  Hyperlipidemia: Continue Lipitor.  Anemia: H/o duodenal ulcer Stable anemia. Will likely need blood transfusion intra op    Nigel Mormon, MD Pager: [phone] Office: [phone]

## 2022-11-20 NOTE — Progress Notes (Signed)
Procedure(s) (LRB): CORONARY ARTERY BYPASS GRAFTING (CABG) (N/A) TRANSESOPHAGEAL ECHOCARDIOGRAM (TEE) (N/A) Subjective: No chest pain on iv heparin while in ED overnight. NSR Am labs ok  Objective: Vital signs in last 24 hours: Temp:  [97.5 F (36.4 C)-98.4 F (36.9 C)] 97.6 F (36.4 C) (11/21 1235) Pulse Rate:  [64-98] 83 (11/21 1235) Resp:  [7-27] 17 (11/21 1235) BP: (98-145)/(44-84) 137/70 (11/21 1235) SpO2:  [91 %-100 %] 100 % (11/21 1235)  Hemodynamic parameters for last 24 hours:  stable  Intake/Output from previous day: 11/20 0701 - 11/21 0700 In: 25.3 [I.V.:25.3] Out: -  Intake/Output this shift: Total I/O In: 25 [I.V.:25] Out: -       Exam    General- alert and comfortable    Neck- no JVD, no cervical adenopathy palpable, no carotid bruit   Lungs- clear without rales, wheezes   Cor- regular rate and rhythm, no murmur , gallop   Abdomen- soft, non-tender   Extremities - warm, non-tender, minimal edema   Neuro- oriented, appropriate, no focal weakness    Lab Results: Recent Labs    11/19/22 1058 11/20/22 0352 11/20/22 0526 11/20/22 0538  WBC 9.2 6.7  --   --   HGB 10.7* 9.7* 10.2* 10.2*  HCT 34.0* 31.7* 30.0* 30.0*  PLT 302 271  --   --    BMET:  Recent Labs    11/19/22 1335 11/20/22 0352 11/20/22 0526 11/20/22 0538  NA 140 139 138 139  K 4.0 3.7 3.8 3.7  CL 104 104  --   --   CO2 23 23  --   --   GLUCOSE 96 116*  --   --   BUN 17 16  --   --   CREATININE 0.89 0.84  --   --   CALCIUM 9.3 9.2  --   --     PT/INR: No results for input(s): "LABPROT", "INR" in the last 72 hours. ABG    Component Value Date/Time   PHART 7.438 11/20/2022 0538   HCO3 26.0 11/20/2022 0538   TCO2 27 11/20/2022 0538   O2SAT 99 11/20/2022 0538   CBG (last 3)  Recent Labs    11/19/22 2218 11/20/22 0755 11/20/22 1247  GLUCAP 155* 120* 149*    Assessment/Plan: S/P Procedure(s) (LRB): CORONARY ARTERY BYPASS GRAFTING (CABG) (N/A) TRANSESOPHAGEAL  ECHOCARDIOGRAM (TEE) (N/A) 3 v CAD NSTEMI Admitted with acute coronary syndrome, CABG in am Recent UGI bleed from DU- Hb now stable 10.5 after endoscopic intervention. Blood setup for OR Will need SNF at discharge since no family around and lives alone- [facility] consult placed  LOS: 1 day    Dahlia Byes 11/20/2022

## 2022-11-20 NOTE — Progress Notes (Signed)
ANTICOAGULATION CONSULT NOTE   Pharmacy Consult for Heparin  Indication: chest pain/ACS, awaiting CABG  No Known Allergies  Patient Measurements: Height: '5\' 4"'$  (162.6 cm) Weight: 79 kg (174 lb 2.6 oz) IBW/kg (Calculated) : 54.7 Heparin Dosing Weight: 71 kg   Vital Signs: Temp: 97.5 F (36.4 C) (11/21 0906) Temp Source: Oral (11/21 0500) BP: 113/67 (11/21 0800) Pulse Rate: 70 (11/21 0800)  Labs: Recent Labs    11/19/22 1058 11/19/22 1251 11/19/22 1335 11/19/22 2316 11/20/22 0352 11/20/22 0526 11/20/22 0538 11/20/22 0756  HGB 10.7*  --   --   --  9.7* 10.2* 10.2*  --   HCT 34.0*  --   --   --  31.7* 30.0* 30.0*  --   PLT 302  --   --   --  271  --   --   --   HEPARINUNFRC  --   --   --  <0.10*  --   --   --  0.26*  CREATININE 0.92  --  0.89  --  0.84  --   --   --   TROPONINIHS 7 7  --   --   --   --   --   --      Estimated Creatinine Clearance: 66.1 mL/min (by C-G formula based on SCr of 0.84 mg/dL).   Medical History: Past Medical History:  Diagnosis Date   Anxiety    Arthritis    Diabetes mellitus without complication (HCC)    Heart murmur    Hyperlipidemia    Hypertension    Thyroid disease     Assessment: 66 yo F presented w chest/back/shoulder pain x2 days found to have unstable angina. no anticoagulants PTA per fill hx. Pt had NSTEMI 11/01/22 s/p LHC 11/2.  Heparin level slightly subtherapeutic s/p rate increase/bolus to 1000 units/hr.  Cards plan CABG 11/22  Goal of Therapy:  Heparin level 0.3-0.7 units/ml Monitor platelets by anticoagulation protocol: Yes   Plan:  Increase heparin gtt to 1100 units/hr F/u 6 hour heparin level  Bertis Ruddy, PharmD Clinical Pharmacist ED Pharmacist Phone # [phone] 11/20/2022 9:09 AM

## 2022-11-20 NOTE — Progress Notes (Signed)
Pt admitted to [facility], VS wnL and as per flow. Pt oriented to 6E processes. Pt familiar with the [facility] system. All questions and concerns addressed. Call bell placed within reach, will continue to monitor and maintain safety. Sister bedside.

## 2022-11-20 NOTE — Progress Notes (Signed)
ANTICOAGULATION CONSULT NOTE   Pharmacy Consult for Heparin  Indication: chest pain/ACS, awaiting CABG  No Known Allergies  Patient Measurements: Height: '5\' 4"'$  (162.6 cm) Weight: 79 kg (174 lb 2.6 oz) IBW/kg (Calculated) : 54.7 Heparin Dosing Weight: 71 kg   Vital Signs: Temp: 98 F (36.7 C) (11/21 1607) Temp Source: Oral (11/21 1607) BP: 127/67 (11/21 1607) Pulse Rate: 82 (11/21 1607)  Labs: Recent Labs    11/19/22 1058 11/19/22 1251 11/19/22 1335 11/19/22 2316 11/20/22 0352 11/20/22 0526 11/20/22 0538 11/20/22 0756 11/20/22 1605  HGB 10.7*  --   --   --  9.7* 10.2* 10.2*  --   --   HCT 34.0*  --   --   --  31.7* 30.0* 30.0*  --   --   PLT 302  --   --   --  271  --   --   --   --   HEPARINUNFRC  --   --   --  <0.10*  --   --   --  0.26* 0.30  CREATININE 0.92  --  0.89  --  0.84  --   --   --   --   TROPONINIHS 7 7  --   --   --   --   --   --   --      Estimated Creatinine Clearance: 66.1 mL/min (by C-G formula based on SCr of 0.84 mg/dL).   Medical History: Past Medical History:  Diagnosis Date   Anxiety    Arthritis    Diabetes mellitus without complication (HCC)    Heart murmur    Hyperlipidemia    Hypertension    Thyroid disease     Assessment: 66 yo F presented w chest/back/shoulder pain x2 days found to have unstable angina. no anticoagulants PTA per fill hx. Pt had NSTEMI 11/01/22 s/p LHC 11/2.  Heparin level slightly subtherapeutic s/p rate increase/bolus to 1000 units/hr.  Cards plan CABG 11/22  Heparin therapeutic at 0.3 on 1100 units/hr - will increase slightly to prevent a dip in the level  Goal of Therapy:  Heparin level 0.3-0.7 units/ml Monitor platelets by anticoagulation protocol: Yes   Plan:  Increase heparin gtt to 1150 units/hr F/u heparin level 6 hours and CBC in am  Alanda Slim, PharmD, [facility] Clinical Pharmacist Please see AMION for all Pharmacists' Contact Phone Numbers 11/20/2022, 5:09 PM

## 2022-11-20 NOTE — ED Notes (Signed)
Messaged MD concerning orders for Nitro.

## 2022-11-20 NOTE — ED Notes (Signed)
MD came to see patient and wants to leave nitro at current dose and is ordered for pain control.

## 2022-11-21 ENCOUNTER — Inpatient Hospital Stay (HOSPITAL_COMMUNITY): Payer: Medicare Other

## 2022-11-21 ENCOUNTER — Inpatient Hospital Stay (HOSPITAL_COMMUNITY): Payer: Medicare Other | Admitting: Anesthesiology

## 2022-11-21 ENCOUNTER — Inpatient Hospital Stay (HOSPITAL_COMMUNITY)
Admission: EM | Disposition: A | Discharge status: Skilled Nursing Facility | Payer: Self-pay | Source: Home / Self Care | Attending: Cardiothoracic Surgery

## 2022-11-21 ENCOUNTER — Other Ambulatory Visit: Payer: Self-pay

## 2022-11-21 DIAGNOSIS — Z951 Presence of aortocoronary bypass graft: Secondary | ICD-10-CM

## 2022-11-21 DIAGNOSIS — I10 Essential (primary) hypertension: Secondary | ICD-10-CM

## 2022-11-21 DIAGNOSIS — F419 Anxiety disorder, unspecified: Secondary | ICD-10-CM

## 2022-11-21 DIAGNOSIS — I252 Old myocardial infarction: Secondary | ICD-10-CM

## 2022-11-21 DIAGNOSIS — I2510 Atherosclerotic heart disease of native coronary artery without angina pectoris: Secondary | ICD-10-CM | POA: Diagnosis not present

## 2022-11-21 DIAGNOSIS — I25119 Atherosclerotic heart disease of native coronary artery with unspecified angina pectoris: Secondary | ICD-10-CM

## 2022-11-21 HISTORY — DX: Presence of aortocoronary bypass graft: Z95.1

## 2022-11-21 HISTORY — PX: CORONARY ARTERY BYPASS GRAFT: SHX141

## 2022-11-21 HISTORY — PX: TEE WITHOUT CARDIOVERSION: SHX5443

## 2022-11-21 LAB — POCT I-STAT 7, (LYTES, BLD GAS, ICA,H+H)
Acid-Base Excess: 1 mmol/L (ref 0.0–2.0)
Acid-Base Excess: 1 mmol/L (ref 0.0–2.0)
Acid-Base Excess: 4 mmol/L — ABNORMAL HIGH (ref 0.0–2.0)
Acid-Base Excess: 2 mmol/L (ref 0.0–2.0)
Acid-Base Excess: 0 mmol/L (ref 0.0–2.0)
Acid-base deficit: 2 mmol/L (ref 0.0–2.0)
Acid-base deficit: 2 mmol/L (ref 0.0–2.0)
Acid-base deficit: 2 mmol/L (ref 0.0–2.0)
Bicarbonate: 25.2 mmol/L (ref 20.0–28.0)
Bicarbonate: 25.9 mmol/L (ref 20.0–28.0)
Bicarbonate: 27.9 mmol/L (ref 20.0–28.0)
Bicarbonate: 22.3 mmol/L (ref 20.0–28.0)
Bicarbonate: 26 mmol/L (ref 20.0–28.0)
Bicarbonate: 23.9 mmol/L (ref 20.0–28.0)
Bicarbonate: 23.3 mmol/L (ref 20.0–28.0)
Bicarbonate: 24.2 mmol/L (ref 20.0–28.0)
Calcium, Ion: 1.22 mmol/L (ref 1.15–1.40)
Calcium, Ion: 0.86 mmol/L — CL (ref 1.15–1.40)
Calcium, Ion: 0.89 mmol/L — CL (ref 1.15–1.40)
Calcium, Ion: 0.95 mmol/L — ABNORMAL LOW (ref 1.15–1.40)
Calcium, Ion: 1.12 mmol/L — ABNORMAL LOW (ref 1.15–1.40)
Calcium, Ion: 1.1 mmol/L — ABNORMAL LOW (ref 1.15–1.40)
Calcium, Ion: 1.15 mmol/L (ref 1.15–1.40)
Calcium, Ion: 1.15 mmol/L (ref 1.15–1.40)
HCT: 29 % — ABNORMAL LOW (ref 36.0–46.0)
HCT: 20 % — ABNORMAL LOW (ref 36.0–46.0)
HCT: 23 % — ABNORMAL LOW (ref 36.0–46.0)
HCT: 23 % — ABNORMAL LOW (ref 36.0–46.0)
HCT: 23 % — ABNORMAL LOW (ref 36.0–46.0)
HCT: 26 % — ABNORMAL LOW (ref 36.0–46.0)
HCT: 27 % — ABNORMAL LOW (ref 36.0–46.0)
HCT: 25 % — ABNORMAL LOW (ref 36.0–46.0)
Hemoglobin: 9.9 g/dL — ABNORMAL LOW (ref 12.0–15.0)
Hemoglobin: 6.8 g/dL — CL (ref 12.0–15.0)
Hemoglobin: 7.8 g/dL — ABNORMAL LOW (ref 12.0–15.0)
Hemoglobin: 7.8 g/dL — ABNORMAL LOW (ref 12.0–15.0)
Hemoglobin: 7.8 g/dL — ABNORMAL LOW (ref 12.0–15.0)
Hemoglobin: 8.8 g/dL — ABNORMAL LOW (ref 12.0–15.0)
Hemoglobin: 9.2 g/dL — ABNORMAL LOW (ref 12.0–15.0)
Hemoglobin: 8.5 g/dL — ABNORMAL LOW (ref 12.0–15.0)
O2 Saturation: 100 %
O2 Saturation: 100 %
O2 Saturation: 100 %
O2 Saturation: 100 %
O2 Saturation: 99 %
O2 Saturation: 96 %
O2 Saturation: 98 %
O2 Saturation: 98 %
Patient temperature: 36.1
Patient temperature: 36.9
Patient temperature: 37
Potassium: 3.7 mmol/L (ref 3.5–5.1)
Potassium: 4 mmol/L (ref 3.5–5.1)
Potassium: 4.2 mmol/L (ref 3.5–5.1)
Potassium: 3.7 mmol/L (ref 3.5–5.1)
Potassium: 4.7 mmol/L (ref 3.5–5.1)
Potassium: 3.4 mmol/L — ABNORMAL LOW (ref 3.5–5.1)
Potassium: 3.8 mmol/L (ref 3.5–5.1)
Potassium: 3.7 mmol/L (ref 3.5–5.1)
Sodium: 139 mmol/L (ref 135–145)
Sodium: 142 mmol/L (ref 135–145)
Sodium: 139 mmol/L (ref 135–145)
Sodium: 140 mmol/L (ref 135–145)
Sodium: 142 mmol/L (ref 135–145)
Sodium: 142 mmol/L (ref 135–145)
Sodium: 141 mmol/L (ref 135–145)
Sodium: 141 mmol/L (ref 135–145)
TCO2: 26 mmol/L (ref 22–32)
TCO2: 27 mmol/L (ref 22–32)
TCO2: 29 mmol/L (ref 22–32)
TCO2: 23 mmol/L (ref 22–32)
TCO2: 27 mmol/L (ref 22–32)
TCO2: 25 mmol/L (ref 22–32)
TCO2: 24 mmol/L (ref 22–32)
TCO2: 26 mmol/L (ref 22–32)
pCO2 arterial: 38.5 mmHg (ref 32–48)
pCO2 arterial: 39.6 mmHg (ref 32–48)
pCO2 arterial: 35.1 mmHg (ref 32–48)
pCO2 arterial: 33.9 mmHg (ref 32–48)
pCO2 arterial: 38.9 mmHg (ref 32–48)
pCO2 arterial: 33.2 mmHg (ref 32–48)
pCO2 arterial: 38.2 mmHg (ref 32–48)
pCO2 arterial: 44.4 mmHg (ref 32–48)
pH, Arterial: 7.423 (ref 7.35–7.45)
pH, Arterial: 7.424 (ref 7.35–7.45)
pH, Arterial: 7.507 — ABNORMAL HIGH (ref 7.35–7.45)
pH, Arterial: 7.428 (ref 7.35–7.45)
pH, Arterial: 7.433 (ref 7.35–7.45)
pH, Arterial: 7.462 — ABNORMAL HIGH (ref 7.35–7.45)
pH, Arterial: 7.392 (ref 7.35–7.45)
pH, Arterial: 7.344 — ABNORMAL LOW (ref 7.35–7.45)
pO2, Arterial: 284 mmHg — ABNORMAL HIGH (ref 83–108)
pO2, Arterial: 498 mmHg — ABNORMAL HIGH (ref 83–108)
pO2, Arterial: 435 mmHg — ABNORMAL HIGH (ref 83–108)
pO2, Arterial: 151 mmHg — ABNORMAL HIGH (ref 83–108)
pO2, Arterial: 386 mmHg — ABNORMAL HIGH (ref 83–108)
pO2, Arterial: 74 mmHg — ABNORMAL LOW (ref 83–108)
pO2, Arterial: 99 mmHg (ref 83–108)
pO2, Arterial: 121 mmHg — ABNORMAL HIGH (ref 83–108)

## 2022-11-21 LAB — BASIC METABOLIC PANEL
Anion gap: 9 (ref 5–15)
Anion gap: 8 (ref 5–15)
BUN: 18 mg/dL (ref 8–23)
BUN: 10 mg/dL (ref 8–23)
CO2: 26 mmol/L (ref 22–32)
CO2: 23 mmol/L (ref 22–32)
Calcium: 9.3 mg/dL (ref 8.9–10.3)
Calcium: 7.9 mg/dL — ABNORMAL LOW (ref 8.9–10.3)
Chloride: 105 mmol/L (ref 98–111)
Chloride: 109 mmol/L (ref 98–111)
Creatinine, Ser: 0.89 mg/dL (ref 0.44–1.00)
Creatinine, Ser: 0.87 mg/dL (ref 0.44–1.00)
GFR, Estimated: >60 mL/min (ref >60)
GFR, Estimated: >60 mL/min (ref >60)
Glucose, Bld: 148 mg/dL — ABNORMAL HIGH (ref 70–99)
Glucose, Bld: 106 mg/dL — ABNORMAL HIGH (ref 70–99)
Potassium: 4 mmol/L (ref 3.5–5.1)
Potassium: 3.8 mmol/L (ref 3.5–5.1)
Sodium: 140 mmol/L (ref 135–145)
Sodium: 140 mmol/L (ref 135–145)

## 2022-11-21 LAB — CBC
HCT: 32.7 % — ABNORMAL LOW (ref 36.0–46.0)
HCT: 26.6 % — ABNORMAL LOW (ref 36.0–46.0)
HCT: 25.9 % — ABNORMAL LOW (ref 36.0–46.0)
Hemoglobin: 10.5 g/dL — ABNORMAL LOW (ref 12.0–15.0)
Hemoglobin: 8.9 g/dL — ABNORMAL LOW (ref 12.0–15.0)
Hemoglobin: 8.5 g/dL — ABNORMAL LOW (ref 12.0–15.0)
MCH: 28 pg (ref 26.0–34.0)
MCH: 29 pg (ref 26.0–34.0)
MCH: 28.5 pg (ref 26.0–34.0)
MCHC: 32.1 g/dL (ref 30.0–36.0)
MCHC: 33.5 g/dL (ref 30.0–36.0)
MCHC: 32.8 g/dL (ref 30.0–36.0)
MCV: 87.2 fL (ref 80.0–100.0)
MCV: 86.6 fL (ref 80.0–100.0)
MCV: 86.9 fL (ref 80.0–100.0)
Platelets: 280 10*3/uL (ref 150–400)
Platelets: 128 10*3/uL — ABNORMAL LOW (ref 150–400)
Platelets: 157 10*3/uL (ref 150–400)
RBC: 3.75 MIL/uL — ABNORMAL LOW (ref 3.87–5.11)
RBC: 3.07 MIL/uL — ABNORMAL LOW (ref 3.87–5.11)
RBC: 2.98 MIL/uL — ABNORMAL LOW (ref 3.87–5.11)
RDW: 13.9 % (ref 11.5–15.5)
RDW: 13.7 % (ref 11.5–15.5)
RDW: 14 % (ref 11.5–15.5)
WBC: 7.6 10*3/uL (ref 4.0–10.5)
WBC: 13.8 10*3/uL — ABNORMAL HIGH (ref 4.0–10.5)
WBC: 15.7 10*3/uL — ABNORMAL HIGH (ref 4.0–10.5)
nRBC: 0 % (ref 0.0–0.2)
nRBC: 0 % (ref 0.0–0.2)
nRBC: 0 % (ref 0.0–0.2)

## 2022-11-21 LAB — POCT I-STAT, CHEM 8
BUN: 15 mg/dL (ref 8–23)
BUN: 13 mg/dL (ref 8–23)
BUN: 12 mg/dL (ref 8–23)
BUN: 11 mg/dL (ref 8–23)
BUN: 10 mg/dL (ref 8–23)
Calcium, Ion: 1.22 mmol/L (ref 1.15–1.40)
Calcium, Ion: 1.17 mmol/L (ref 1.15–1.40)
Calcium, Ion: 0.91 mmol/L — ABNORMAL LOW (ref 1.15–1.40)
Calcium, Ion: 0.89 mmol/L — CL (ref 1.15–1.40)
Calcium, Ion: 1.12 mmol/L — ABNORMAL LOW (ref 1.15–1.40)
Chloride: 104 mmol/L (ref 98–111)
Chloride: 106 mmol/L (ref 98–111)
Chloride: 102 mmol/L (ref 98–111)
Chloride: 103 mmol/L (ref 98–111)
Chloride: 105 mmol/L (ref 98–111)
Creatinine, Ser: 0.7 mg/dL (ref 0.44–1.00)
Creatinine, Ser: 0.5 mg/dL (ref 0.44–1.00)
Creatinine, Ser: 0.4 mg/dL — ABNORMAL LOW (ref 0.44–1.00)
Creatinine, Ser: 0.4 mg/dL — ABNORMAL LOW (ref 0.44–1.00)
Creatinine, Ser: 0.5 mg/dL (ref 0.44–1.00)
Glucose, Bld: 128 mg/dL — ABNORMAL HIGH (ref 70–99)
Glucose, Bld: 110 mg/dL — ABNORMAL HIGH (ref 70–99)
Glucose, Bld: 116 mg/dL — ABNORMAL HIGH (ref 70–99)
Glucose, Bld: 123 mg/dL — ABNORMAL HIGH (ref 70–99)
Glucose, Bld: 116 mg/dL — ABNORMAL HIGH (ref 70–99)
HCT: 27 % — ABNORMAL LOW (ref 36.0–46.0)
HCT: 26 % — ABNORMAL LOW (ref 36.0–46.0)
HCT: 24 % — ABNORMAL LOW (ref 36.0–46.0)
HCT: 20 % — ABNORMAL LOW (ref 36.0–46.0)
HCT: 24 % — ABNORMAL LOW (ref 36.0–46.0)
Hemoglobin: 9.2 g/dL — ABNORMAL LOW (ref 12.0–15.0)
Hemoglobin: 8.8 g/dL — ABNORMAL LOW (ref 12.0–15.0)
Hemoglobin: 8.2 g/dL — ABNORMAL LOW (ref 12.0–15.0)
Hemoglobin: 6.8 g/dL — CL (ref 12.0–15.0)
Hemoglobin: 8.2 g/dL — ABNORMAL LOW (ref 12.0–15.0)
Potassium: 3.7 mmol/L (ref 3.5–5.1)
Potassium: 3.7 mmol/L (ref 3.5–5.1)
Potassium: 4.9 mmol/L (ref 3.5–5.1)
Potassium: 5.1 mmol/L (ref 3.5–5.1)
Potassium: 3.7 mmol/L (ref 3.5–5.1)
Sodium: 140 mmol/L (ref 135–145)
Sodium: 140 mmol/L (ref 135–145)
Sodium: 139 mmol/L (ref 135–145)
Sodium: 138 mmol/L (ref 135–145)
Sodium: 142 mmol/L (ref 135–145)
TCO2: 25 mmol/L (ref 22–32)
TCO2: 23 mmol/L (ref 22–32)
TCO2: 26 mmol/L (ref 22–32)
TCO2: 26 mmol/L (ref 22–32)
TCO2: 22 mmol/L (ref 22–32)

## 2022-11-21 LAB — HEMOGLOBIN AND HEMATOCRIT, BLOOD
HCT: 26.1 % — ABNORMAL LOW (ref 36.0–46.0)
Hemoglobin: 8.4 g/dL — ABNORMAL LOW (ref 12.0–15.0)

## 2022-11-21 LAB — PLATELET COUNT: Platelets: 150 10*3/uL (ref 150–400)

## 2022-11-21 LAB — POCT I-STAT EG7
Acid-base deficit: 1 mmol/L (ref 0.0–2.0)
Bicarbonate: 23.5 mmol/L (ref 20.0–28.0)
Calcium, Ion: 0.96 mmol/L — ABNORMAL LOW (ref 1.15–1.40)
HCT: 20 % — ABNORMAL LOW (ref 36.0–46.0)
Hemoglobin: 6.8 g/dL — CL (ref 12.0–15.0)
O2 Saturation: 82 %
Potassium: 3.4 mmol/L — ABNORMAL LOW (ref 3.5–5.1)
Sodium: 142 mmol/L (ref 135–145)
TCO2: 25 mmol/L (ref 22–32)
pCO2, Ven: 38.8 mmHg — ABNORMAL LOW (ref 44–60)
pH, Ven: 7.39 (ref 7.25–7.43)
pO2, Ven: 47 mmHg — ABNORMAL HIGH (ref 32–45)

## 2022-11-21 LAB — PROTIME-INR
INR: 1.4 — ABNORMAL HIGH (ref 0.8–1.2)
Prothrombin Time: 16.6 seconds — ABNORMAL HIGH (ref 11.4–15.2)

## 2022-11-21 LAB — GLUCOSE, CAPILLARY
Glucose-Capillary: 133 mg/dL — ABNORMAL HIGH (ref 70–99)
Glucose-Capillary: 114 mg/dL — ABNORMAL HIGH (ref 70–99)
Glucose-Capillary: 114 mg/dL — ABNORMAL HIGH (ref 70–99)
Glucose-Capillary: 136 mg/dL — ABNORMAL HIGH (ref 70–99)
Glucose-Capillary: 108 mg/dL — ABNORMAL HIGH (ref 70–99)
Glucose-Capillary: 151 mg/dL — ABNORMAL HIGH (ref 70–99)
Glucose-Capillary: 147 mg/dL — ABNORMAL HIGH (ref 70–99)

## 2022-11-21 LAB — MAGNESIUM: Magnesium: 2.6 mg/dL — ABNORMAL HIGH (ref 1.7–2.4)

## 2022-11-21 LAB — HEPARIN LEVEL (UNFRACTIONATED): Heparin Unfractionated: 0.25 IU/mL — ABNORMAL LOW (ref 0.30–0.70)

## 2022-11-21 LAB — PREPARE RBC (CROSSMATCH)

## 2022-11-21 LAB — APTT: aPTT: 31 seconds (ref 24–36)

## 2022-11-21 SURGERY — CORONARY ARTERY BYPASS GRAFTING (CABG)
Anesthesia: General | Site: Chest

## 2022-11-21 MED ORDER — NOREPINEPHRINE 16 MG/250ML-% IV SOLN
0.0000 ug/min | INTRAVENOUS | Status: DC
  Administered 2022-11-21: 2 ug/min via INTRAVENOUS
  Filled 2022-11-21: qty 250

## 2022-11-21 MED ORDER — PHENYLEPHRINE HCL-NACL 20-0.9 MG/250ML-% IV SOLN: 0.0000 ug/min | INTRAVENOUS | Status: DC

## 2022-11-21 MED ORDER — VANCOMYCIN HCL IN DEXTROSE 1-5 GM/200ML-% IV SOLN
1000.0000 mg | Freq: Once | INTRAVENOUS | Status: AC
  Administered 2022-11-21: 1000 mg via INTRAVENOUS
  Filled 2022-11-21: qty 200

## 2022-11-21 MED ORDER — ROCURONIUM BROMIDE 10 MG/ML (PF) SYRINGE
PREFILLED_SYRINGE | INTRAVENOUS | Status: DC | PRN
  Administered 2022-11-21 (×2): 50 mg via INTRAVENOUS
  Administered 2022-11-21: 30 mg via INTRAVENOUS
  Administered 2022-11-21: 70 mg via INTRAVENOUS
  Administered 2022-11-21 (×3): 50 mg via INTRAVENOUS

## 2022-11-21 MED ORDER — CEFAZOLIN SODIUM-DEXTROSE 2-4 GM/100ML-% IV SOLN
2.0000 g | Freq: Three times a day (TID) | INTRAVENOUS | Status: AC
  Administered 2022-11-21 – 2022-11-23 (×6): 2 g via INTRAVENOUS
  Filled 2022-11-21 (×5): qty 100

## 2022-11-21 MED ORDER — HEMOSTATIC AGENTS (NO CHARGE) OPTIME
TOPICAL | Status: DC | PRN
  Administered 2022-11-21 (×2): 1 via TOPICAL
  Administered 2022-11-21: 2 via TOPICAL
  Administered 2022-11-21: 1 via TOPICAL

## 2022-11-21 MED ORDER — NITROGLYCERIN IN D5W 200-5 MCG/ML-% IV SOLN: 0.0000 ug/min | INTRAVENOUS | Status: DC

## 2022-11-21 MED ORDER — MIDAZOLAM HCL (PF) 10 MG/2ML IJ SOLN
INTRAMUSCULAR | Status: AC
  Filled 2022-11-21: qty 2

## 2022-11-21 MED ORDER — TRAMADOL HCL 50 MG PO TABS
50.0000 mg | ORAL_TABLET | ORAL | Status: DC | PRN
  Administered 2022-11-22 – 2022-11-23 (×2): 50 mg via ORAL
  Filled 2022-11-21 (×2): qty 1

## 2022-11-21 MED ORDER — FENTANYL CITRATE (PF) 250 MCG/5ML IJ SOLN
INTRAMUSCULAR | Status: AC
  Filled 2022-11-21: qty 5

## 2022-11-21 MED ORDER — LACTATED RINGERS IV SOLN: INTRAVENOUS | Status: DC | PRN

## 2022-11-21 MED ORDER — METOPROLOL TARTRATE 5 MG/5ML IV SOLN: 2.5000 mg | INTRAVENOUS | Status: DC | PRN

## 2022-11-21 MED ORDER — HEPARIN SODIUM (PORCINE) 1000 UNIT/ML IJ SOLN
INTRAMUSCULAR | Status: DC | PRN
  Administered 2022-11-21: 30000 [IU] via INTRAVENOUS
  Administered 2022-11-21: 2000 [IU] via INTRAVENOUS

## 2022-11-21 MED ORDER — ACETAMINOPHEN 160 MG/5ML PO SOLN: 1000.0000 mg | Freq: Four times a day (QID) | ORAL | Status: AC

## 2022-11-21 MED ORDER — BISACODYL 5 MG PO TBEC
10.0000 mg | DELAYED_RELEASE_TABLET | Freq: Every day | ORAL | Status: DC
  Administered 2022-11-22 – 2022-11-27 (×4): 10 mg via ORAL
  Filled 2022-11-21 (×5): qty 2

## 2022-11-21 MED ORDER — FENTANYL CITRATE (PF) 250 MCG/5ML IJ SOLN
INTRAMUSCULAR | Status: DC | PRN
  Administered 2022-11-21: 200 ug via INTRAVENOUS
  Administered 2022-11-21: 100 ug via INTRAVENOUS
  Administered 2022-11-21: 150 ug via INTRAVENOUS
  Administered 2022-11-21 (×2): 50 ug via INTRAVENOUS
  Administered 2022-11-21: 150 ug via INTRAVENOUS
  Administered 2022-11-21 (×2): 100 ug via INTRAVENOUS
  Administered 2022-11-21: 50 ug via INTRAVENOUS

## 2022-11-21 MED ORDER — SODIUM CHLORIDE 0.9% FLUSH
3.0000 mL | Freq: Two times a day (BID) | INTRAVENOUS | Status: DC
  Administered 2022-11-22 – 2022-11-26 (×9): 3 mL via INTRAVENOUS

## 2022-11-21 MED ORDER — PANTOPRAZOLE SODIUM 40 MG IV SOLR
40.0000 mg | Freq: Two times a day (BID) | INTRAVENOUS | Status: DC
  Administered 2022-11-21 – 2022-11-26 (×11): 40 mg via INTRAVENOUS
  Filled 2022-11-21 (×11): qty 10

## 2022-11-21 MED ORDER — ORAL CARE MOUTH RINSE: 15.0000 mL | OROMUCOSAL | Status: DC | PRN

## 2022-11-21 MED ORDER — OXYCODONE HCL 5 MG PO TABS
5.0000 mg | ORAL_TABLET | ORAL | Status: DC | PRN
  Administered 2022-11-22 (×3): 5 mg via ORAL
  Administered 2022-11-22: 10 mg via ORAL
  Administered 2022-11-22 – 2022-11-23 (×2): 5 mg via ORAL
  Administered 2022-11-23: 10 mg via ORAL
  Filled 2022-11-21: qty 1
  Filled 2022-11-21: qty 2
  Filled 2022-11-21 (×2): qty 1
  Filled 2022-11-21: qty 2
  Filled 2022-11-21 (×2): qty 1

## 2022-11-21 MED ORDER — ASPIRIN 81 MG PO CHEW
324.0000 mg | CHEWABLE_TABLET | Freq: Every day | ORAL | Status: DC
  Administered 2022-11-26: 324 mg
  Filled 2022-11-21 (×2): qty 4

## 2022-11-21 MED ORDER — DOCUSATE SODIUM 100 MG PO CAPS
200.0000 mg | ORAL_CAPSULE | Freq: Every day | ORAL | Status: DC
  Administered 2022-11-22 – 2022-11-27 (×4): 200 mg via ORAL
  Filled 2022-11-21 (×5): qty 2

## 2022-11-21 MED ORDER — BISACODYL 10 MG RE SUPP
10.0000 mg | Freq: Every day | RECTAL | Status: DC
  Filled 2022-11-21: qty 1

## 2022-11-21 MED ORDER — SODIUM CHLORIDE 0.9% IV SOLUTION: Freq: Once | INTRAVENOUS | Status: DC

## 2022-11-21 MED ORDER — MILRINONE LACTATE IN DEXTROSE 20-5 MG/100ML-% IV SOLN
0.2500 ug/kg/min | INTRAVENOUS | Status: DC
  Administered 2022-11-22: 0.25 ug/kg/min via INTRAVENOUS
  Filled 2022-11-21: qty 100

## 2022-11-21 MED ORDER — PHENYLEPHRINE 80 MCG/ML (10ML) SYRINGE FOR IV PUSH (FOR BLOOD PRESSURE SUPPORT)
PREFILLED_SYRINGE | INTRAVENOUS | Status: DC | PRN
  Administered 2022-11-21: 40 ug via INTRAVENOUS
  Administered 2022-11-21: 80 ug via INTRAVENOUS
  Administered 2022-11-21: 40 ug via INTRAVENOUS
  Administered 2022-11-21: 80 ug via INTRAVENOUS
  Administered 2022-11-21 (×2): 40 ug via INTRAVENOUS

## 2022-11-21 MED ORDER — LACTATED RINGERS IV SOLN: INTRAVENOUS | Status: DC

## 2022-11-21 MED ORDER — SODIUM CHLORIDE 0.9 % IV SOLN: 250.0000 mL | INTRAVENOUS | Status: DC

## 2022-11-21 MED ORDER — ORAL CARE MOUTH RINSE
15.0000 mL | OROMUCOSAL | Status: DC
  Administered 2022-11-21 – 2022-11-24 (×6): 15 mL via OROMUCOSAL

## 2022-11-21 MED ORDER — POTASSIUM CHLORIDE 10 MEQ/50ML IV SOLN
10.0000 meq | INTRAVENOUS | Status: AC
  Administered 2022-11-21 (×3): 10 meq via INTRAVENOUS

## 2022-11-21 MED ORDER — SODIUM CHLORIDE 0.45 % IV SOLN: INTRAVENOUS | Status: DC | PRN

## 2022-11-21 MED ORDER — ASPIRIN 325 MG PO TBEC
325.0000 mg | DELAYED_RELEASE_TABLET | Freq: Every day | ORAL | Status: DC
  Administered 2022-11-22 – 2022-11-27 (×5): 325 mg via ORAL
  Filled 2022-11-21 (×6): qty 1

## 2022-11-21 MED ORDER — DEXMEDETOMIDINE HCL IN NACL 400 MCG/100ML IV SOLN
0.0000 ug/kg/h | INTRAVENOUS | Status: DC
  Administered 2022-11-21: 0.4 ug/kg/h via INTRAVENOUS
  Filled 2022-11-21: qty 100

## 2022-11-21 MED ORDER — SODIUM CHLORIDE 0.9 % IV SOLN: INTRAVENOUS | Status: DC

## 2022-11-21 MED ORDER — PLASMA-LYTE A IV SOLN: INTRAVENOUS | Status: DC | PRN

## 2022-11-21 MED ORDER — MAGNESIUM SULFATE 4 GM/100ML IV SOLN
4.0000 g | Freq: Once | INTRAVENOUS | Status: AC
  Administered 2022-11-21: 4 g via INTRAVENOUS
  Filled 2022-11-21: qty 100

## 2022-11-21 MED ORDER — PROTAMINE SULFATE 10 MG/ML IV SOLN
INTRAVENOUS | Status: DC | PRN
  Administered 2022-11-21: 10 mg via INTRAVENOUS
  Administered 2022-11-21: 310 mg via INTRAVENOUS

## 2022-11-21 MED ORDER — PROPOFOL 10 MG/ML IV BOLUS
INTRAVENOUS | Status: DC | PRN
  Administered 2022-11-21: 90 mg via INTRAVENOUS

## 2022-11-21 MED ORDER — ONDANSETRON HCL 4 MG/2ML IJ SOLN
4.0000 mg | Freq: Four times a day (QID) | INTRAMUSCULAR | Status: DC | PRN
  Administered 2022-11-22: 4 mg via INTRAVENOUS
  Filled 2022-11-21: qty 2

## 2022-11-21 MED ORDER — FAMOTIDINE IN NACL 20-0.9 MG/50ML-% IV SOLN: 20.0000 mg | Freq: Two times a day (BID) | INTRAVENOUS | Status: DC

## 2022-11-21 MED ORDER — MORPHINE SULFATE (PF) 2 MG/ML IV SOLN
1.0000 mg | INTRAVENOUS | Status: DC | PRN
  Administered 2022-11-21 (×2): 2 mg via INTRAVENOUS
  Filled 2022-11-21 (×2): qty 1

## 2022-11-21 MED ORDER — ACETAMINOPHEN 650 MG RE SUPP
650.0000 mg | Freq: Once | RECTAL | Status: AC
  Administered 2022-11-21: 650 mg via RECTAL

## 2022-11-21 MED ORDER — METOCLOPRAMIDE HCL 5 MG/ML IJ SOLN
10.0000 mg | Freq: Four times a day (QID) | INTRAMUSCULAR | Status: DC
  Administered 2022-11-21 – 2022-11-26 (×19): 10 mg via INTRAVENOUS
  Filled 2022-11-21 (×19): qty 2

## 2022-11-21 MED ORDER — SODIUM CHLORIDE 0.9% FLUSH
3.0000 mL | INTRAVENOUS | Status: DC | PRN
  Administered 2022-11-25: 3 mL via INTRAVENOUS

## 2022-11-21 MED ORDER — OXYCODONE-ACETAMINOPHEN 5-325 MG PO TABS
1.0000 | ORAL_TABLET | Freq: Once | ORAL | Status: AC | PRN
  Administered 2022-11-21: 1 via ORAL
  Filled 2022-11-21: qty 1

## 2022-11-21 MED ORDER — SODIUM CHLORIDE 0.9 % IV SOLN: INTRAVENOUS | Status: DC | PRN

## 2022-11-21 MED ORDER — ACETAMINOPHEN 160 MG/5ML PO SOLN: 650.0000 mg | Freq: Once | ORAL | Status: AC

## 2022-11-21 MED ORDER — METOPROLOL TARTRATE 12.5 MG HALF TABLET
12.5000 mg | ORAL_TABLET | Freq: Two times a day (BID) | ORAL | Status: DC
  Administered 2022-11-22 – 2022-11-24 (×5): 12.5 mg via ORAL
  Filled 2022-11-21 (×5): qty 1

## 2022-11-21 MED ORDER — PROPOFOL 10 MG/ML IV BOLUS
INTRAVENOUS | Status: AC
  Filled 2022-11-21: qty 20

## 2022-11-21 MED ORDER — MIDAZOLAM HCL 2 MG/2ML IJ SOLN: 2.0000 mg | INTRAMUSCULAR | Status: DC | PRN

## 2022-11-21 MED ORDER — LACTATED RINGERS IV SOLN: 500.0000 mL | Freq: Once | INTRAVENOUS | Status: DC | PRN

## 2022-11-21 MED ORDER — CHLORHEXIDINE GLUCONATE CLOTH 2 % EX PADS
6.0000 | MEDICATED_PAD | Freq: Every day | CUTANEOUS | Status: DC
  Administered 2022-11-21 – 2022-11-24 (×4): 6 via TOPICAL

## 2022-11-21 MED ORDER — 0.9 % SODIUM CHLORIDE (POUR BTL) OPTIME
TOPICAL | Status: DC | PRN
  Administered 2022-11-21: 6000 mL

## 2022-11-21 MED ORDER — METOPROLOL TARTRATE 25 MG/10 ML ORAL SUSPENSION
12.5000 mg | Freq: Two times a day (BID) | ORAL | Status: DC
  Filled 2022-11-21 (×2): qty 5

## 2022-11-21 MED ORDER — ALBUMIN HUMAN 5 % IV SOLN
250.0000 mL | INTRAVENOUS | Status: AC | PRN
  Administered 2022-11-21 (×2): 12.5 g via INTRAVENOUS
  Filled 2022-11-21: qty 250

## 2022-11-21 MED ORDER — CHLORHEXIDINE GLUCONATE 0.12 % MT SOLN
15.0000 mL | OROMUCOSAL | Status: AC
  Administered 2022-11-21: 15 mL via OROMUCOSAL
  Filled 2022-11-21: qty 15

## 2022-11-21 MED ORDER — MIDAZOLAM HCL (PF) 5 MG/ML IJ SOLN
INTRAMUSCULAR | Status: DC | PRN
  Administered 2022-11-21 (×3): 2 mg via INTRAVENOUS
  Administered 2022-11-21: 1 mg via INTRAVENOUS
  Administered 2022-11-21: 3 mg via INTRAVENOUS

## 2022-11-21 MED ORDER — PANTOPRAZOLE SODIUM 40 MG PO TBEC: 40.0000 mg | DELAYED_RELEASE_TABLET | Freq: Every day | ORAL | Status: DC

## 2022-11-21 MED ORDER — INSULIN REGULAR(HUMAN) IN NACL 100-0.9 UT/100ML-% IV SOLN
INTRAVENOUS | Status: DC
  Administered 2022-11-21: 1.8 [IU]/h via INTRAVENOUS

## 2022-11-21 MED ORDER — DEXTROSE 50 % IV SOLN: 0.0000 mL | INTRAVENOUS | Status: DC | PRN

## 2022-11-21 MED ORDER — ACETAMINOPHEN 500 MG PO TABS
1000.0000 mg | ORAL_TABLET | Freq: Four times a day (QID) | ORAL | Status: AC
  Administered 2022-11-22 – 2022-11-26 (×19): 1000 mg via ORAL
  Filled 2022-11-21 (×20): qty 2

## 2022-11-21 MED ORDER — SURGIFLO WITH THROMBIN (HEMOSTATIC MATRIX KIT) OPTIME
TOPICAL | Status: DC | PRN
  Administered 2022-11-21 (×2): 1 via TOPICAL

## 2022-11-21 SURGICAL SUPPLY — 90 items
ADAPTER CARDIO PERF ANTE/RETRO (ADAPTER) ×3 IMPLANT
ADH SKN CLS APL DERMABOND .7 (GAUZE/BANDAGES/DRESSINGS) ×2
ADPR PRFSN 84XANTGRD RTRGD (ADAPTER) ×4
AGENT HMST KT MTR STRL THRMB (HEMOSTASIS) ×2
BAG DECANTER FOR FLEXI CONT (MISCELLANEOUS) ×3 IMPLANT
BINDER BREAST XXLRG (GAUZE/BANDAGES/DRESSINGS) IMPLANT
BLADE CLIPPER SURG (BLADE) ×3 IMPLANT
BLADE NDL 3 SS STRL (BLADE) IMPLANT
BLADE NEEDLE 3 SS STRL (BLADE) ×2 IMPLANT
BLADE STERNUM SYSTEM 6 (BLADE) ×3 IMPLANT
BLADE SURG 11 STRL SS (BLADE) IMPLANT
BLADE SURG 12 STRL SS (BLADE) ×3 IMPLANT
BNDG ELASTIC 4X5.8 VLCR STR LF (GAUZE/BANDAGES/DRESSINGS) IMPLANT
BNDG ELASTIC 6X5.8 VLCR STR LF (GAUZE/BANDAGES/DRESSINGS) IMPLANT
BNDG GAUZE DERMACEA FLUFF 4 (GAUZE/BANDAGES/DRESSINGS) IMPLANT
BNDG GZE DERMACEA 4 6PLY (GAUZE/BANDAGES/DRESSINGS) ×2
CANISTER SUCT 3000ML PPV (MISCELLANEOUS) ×3 IMPLANT
CANNULA GUNDRY RCSP 15FR (MISCELLANEOUS) ×3 IMPLANT
CANNULA MC2 2 STG 36/46 NON-V (CANNULA) IMPLANT
CANNULA VENOUS 2 STG 34/46 (CANNULA) ×2
CATH CPB KIT VANTRIGT (MISCELLANEOUS) ×3 IMPLANT
CATH ROBINSON RED A/P 18FR (CATHETERS) ×9 IMPLANT
CATH THORACIC 28FR RT ANG (CATHETERS) IMPLANT
DEFOGGER ANTIFOG KIT (MISCELLANEOUS) IMPLANT
DERMABOND ADVANCED .7 DNX12 (GAUZE/BANDAGES/DRESSINGS) IMPLANT
DRAIN CHANNEL 32F RND 10.7 FF (WOUND CARE) IMPLANT
DRAPE CARDIOVASCULAR INCISE (DRAPES) ×2
DRAPE SLUSH/WARMER DISC (DRAPES) ×3 IMPLANT
DRAPE SRG 135X102X78XABS (DRAPES) ×3 IMPLANT
DRSG AQUACEL AG ADV 3.5X14 (GAUZE/BANDAGES/DRESSINGS) IMPLANT
ELECT BLADE 4.0 EZ CLEAN MEGAD (MISCELLANEOUS) ×2
ELECT BLADE 6.5 EXT (BLADE) ×3 IMPLANT
ELECT CAUTERY BLADE 6.4 (BLADE) ×3 IMPLANT
ELECT REM PT RETURN 9FT ADLT (ELECTROSURGICAL) ×2
ELECTRODE BLDE 4.0 EZ CLN MEGD (MISCELLANEOUS) ×3 IMPLANT
ELECTRODE REM PT RTRN 9FT ADLT (ELECTROSURGICAL) ×6 IMPLANT
FELT TEFLON 1X6 (MISCELLANEOUS) ×6 IMPLANT
GAUZE SPONGE 4X4 12PLY STRL (GAUZE/BANDAGES/DRESSINGS) IMPLANT
GLOVE BIO SURGEON STRL SZ7.5 (GLOVE) ×9 IMPLANT
GOWN STRL REUS W/ TWL LRG LVL3 (GOWN DISPOSABLE) ×12 IMPLANT
GOWN STRL REUS W/TWL LRG LVL3 (GOWN DISPOSABLE) ×14
HEMOSTAT POWDER SURGIFOAM 1G (HEMOSTASIS) ×9 IMPLANT
HEMOSTAT SURGICEL 2X14 (HEMOSTASIS) ×3 IMPLANT
KIT BASIN OR (CUSTOM PROCEDURE TRAY) ×3 IMPLANT
KIT SUCTION CATH 14FR (SUCTIONS) ×3 IMPLANT
KIT TURNOVER KIT B (KITS) ×3 IMPLANT
KIT VASOVIEW HEMOPRO 2 VH 4000 (KITS) ×3 IMPLANT
LEAD PACING MYOCARDI (MISCELLANEOUS) ×3 IMPLANT
MARKER GRAFT CORONARY BYPASS (MISCELLANEOUS) ×9 IMPLANT
NS IRRIG 1000ML POUR BTL (IV SOLUTION) ×15 IMPLANT
PACK E OPEN HEART (SUTURE) ×3 IMPLANT
PACK OPEN HEART (CUSTOM PROCEDURE TRAY) ×3 IMPLANT
PAD ARMBOARD 7.5X6 YLW CONV (MISCELLANEOUS) ×6 IMPLANT
PAD ELECT DEFIB RADIOL ZOLL (MISCELLANEOUS) ×3 IMPLANT
PENCIL BUTTON HOLSTER BLD 10FT (ELECTRODE) ×3 IMPLANT
POSITIONER HEAD DONUT 9IN (MISCELLANEOUS) ×3 IMPLANT
POWDER SURGICEL 3.0 GRAM (HEMOSTASIS) ×3 IMPLANT
PUNCH AORTIC ROTATE 4.5MM 8IN (MISCELLANEOUS) IMPLANT
SET MPS 3-ND DEL (MISCELLANEOUS) IMPLANT
SPONGE T-LAP 18X18 ~~LOC~~+RFID (SPONGE) IMPLANT
SPONGE T-LAP 4X18 ~~LOC~~+RFID (SPONGE) ×3 IMPLANT
SUPPORT HEART JANKE-BARRON (MISCELLANEOUS) ×3 IMPLANT
SURGIFLO W/THROMBIN 8M KIT (HEMOSTASIS) ×3 IMPLANT
SUT BONE WAX W31G (SUTURE) ×3 IMPLANT
SUT MNCRL AB 4-0 PS2 18 (SUTURE) IMPLANT
SUT PROLENE 4 0 RB 1 (SUTURE) ×2
SUT PROLENE 4 0 SH DA (SUTURE) ×3 IMPLANT
SUT PROLENE 4-0 RB1 .5 CRCL 36 (SUTURE) ×3 IMPLANT
SUT PROLENE 6 0 CC (SUTURE) IMPLANT
SUT PROLENE 8 0 BV175 6 (SUTURE) IMPLANT
SUT PROLENE BLUE 7 0 (SUTURE) ×3 IMPLANT
SUT PROLENE POLY MONO (SUTURE) IMPLANT
SUT SILK  1 MH (SUTURE) ×6
SUT SILK 1 MH (SUTURE) IMPLANT
SUT SILK 2 0 SH CR/8 (SUTURE) IMPLANT
SUT STEEL 6MS V (SUTURE) ×6 IMPLANT
SUT STEEL SZ 6 DBL 3X14 BALL (SUTURE) ×3 IMPLANT
SUT VIC AB 1 CTX 36 (SUTURE) ×6
SUT VIC AB 1 CTX36XBRD ANBCTR (SUTURE) ×6 IMPLANT
SUT VIC AB 2-0 CT1 27 (SUTURE) ×4
SUT VIC AB 2-0 CT1 TAPERPNT 27 (SUTURE) IMPLANT
SYSTEM SAHARA CHEST DRAIN ATS (WOUND CARE) ×3 IMPLANT
TAPE CLOTH SURG 4X10 WHT LF (GAUZE/BANDAGES/DRESSINGS) IMPLANT
TAPE PAPER 2X10 WHT MICROPORE (GAUZE/BANDAGES/DRESSINGS) IMPLANT
TOWEL GREEN STERILE (TOWEL DISPOSABLE) ×3 IMPLANT
TOWEL GREEN STERILE FF (TOWEL DISPOSABLE) ×3 IMPLANT
TRAY FOLEY SLVR 16FR TEMP STAT (SET/KITS/TRAYS/PACK) ×3 IMPLANT
TUBING LAP HI FLOW INSUFFLATIO (TUBING) ×3 IMPLANT
UNDERPAD 30X36 HEAVY ABSORB (UNDERPADS AND DIAPERS) ×3 IMPLANT
WATER STERILE IRR 1000ML POUR (IV SOLUTION) ×6 IMPLANT

## 2022-11-21 NOTE — Op Note (Unsigned)
Mercedes Adams, Mercedes Adams MEDICAL RECORD NO: [id number] ACCOUNT NO: [id number] DATE OF BIRTH: November 10, 1956 FACILITY: [facility] LOCATION: [facility] PHYSICIAN: Ivin Poot III, MD  Operative Report   DATE OF PROCEDURE: 11/21/2022  OPERATION:   1.  Coronary artery bypass grafting x3 (left internal mammary artery to diagonal branch of LAD, saphenous vein graft to posterior descending, saphenous vein graft to distal circumflex posterolateral).  2.  Endoscopic harvest of right leg greater saphenous vein.  PREOPERATIVE DIAGNOSES:  Acute coronary syndrome, severe 3-vessel coronary artery disease, type 2 diabetes, obesity, recent upper gastrointestinal bleed from duodenal ulcer.  POSTOPERATIVE DIAGNOSES:  Acute coronary syndrome, severe 3-vessel coronary artery disease, type 2 diabetes, obesity, recent upper gastrointestinal bleed from duodenal ulcer.  SURGEON:  Len Childs, MD  ASSISTANT:  Jadene Pierini, PA-C  ANESTHESIA: General by Dr. Albertha Ghee.  A surgical first assistant was needed for this operation due to the complexity of the procedure and the standard of surgical care for cardiac surgery at this institution.  The surgical first assistant was needed to endoscopically harvest the vein conduit  from the leg as well as to close the leg incisions.  The surgical first assistant was needed for assistance with difficult distal coronary anastomoses of heavily diseased small vessels including suture management, suctioning, exposure and general  assistance.  DESCRIPTION OF PROCEDURE:  The patient was brought from the telemetry unit to the preoperative holding area where the patient was assessed by anesthesia and surgical consent was documented.  I saw the patient and examined the patient in preoperative  holding and the patient was stable without complaints.  She had no further questions regarding the procedure, which we had discussed extensively the previous evening along with the patient's  family/sister.  The patient was then taken directly back to the operating room and placed supine on the operating table.  General anesthesia was induced under invasive hemodynamic monitoring.  The patient remained stable.  A transesophageal echo probe was placed by the  anesthesia team.  The patient was prepped and draped as a sterile field.  A proper timeout was performed.  A sternal incision was made as the saphenous vein was harvested endoscopically from the right leg.  The sternum was divided and retracted.  The  left internal mammary artery was harvested as a pedicle graft from its origin at the subclavian vessels.  It was a 1.4 mm vessel with good flow.  The saphenous vein was small, but acceptable with a diameter of approximately 2-3 mm without varicosities.  The sternal retractor was placed using deep blades because of the patient's obese body habitus.  The pericardium was opened and suspended.  Pursestrings were placed in the ascending aorta and right atrium, and after the vein was harvested, the patient  was heparinized and cannulated and placed on cardiopulmonary bypass.  The heart was examined and showed no evidence of myocardial scarring.  The coronaries were heavily calcified and had diffuse disease consistent with a diabetic etiology.  The distal  coronary vessels were identified for grafting and these included the posterior descending just after the bifurcation, the large diagonal branch of the LAD, and the distal circumflex posterolateral.  All had very tight proximal stenosis.  The LAD was  small and atretic and did not reach the LV apex.  It was a non-graftable vessel.  Cardioplegia cannulas were placed for both antegrade and retrograde cold blood cardioplegia and the patient was cooled to 32 degrees.  The aortic crossclamp was applied.  One liter of cold blood cardioplegia was delivered in split doses between the  antegrade aortic and retrograde coronary sinus catheters.  There was  good cardioplegic arrest and supple temperature drop less than 12 degrees.  Cardioplegia was delivered every 20 minutes while the crossclamp was applied.  The distal coronary anastomoses were performed.  The first distal anastomosis was to the posterior descending.  This was a 1.0 mm vessel with proximal 99% stenosis.  A reverse saphenous vein was sewn end-to-side with running 7-0 Prolene.  There was good  flow through the graft.  Cardioplegia was redosed.  The second distal anastomosis was to the distal circumflex.  There was a proximal 80% ostial stenosis.  A reverse saphenous vein was sewn end-to-side with running 7-0 Prolene with good flow through the graft.  Cardioplegia was redosed.  The third distal anastomosis was to the diagonal branch of LAD.  It was a 1.4 mm vessel with high-grade 90% proximal stenosis.  It probed a 1 mm probe back to the LAD.  The left IMA pedicle was brought through an opening in the pericardium, was brought  down onto the diagonal and was sewn end-to-side with running 8-0 Prolene.  There was good flow through the anastomosis after briefly releasing the pedicle bulldog in the mammary artery.  The bulldog was reapplied and the pedicle secured to epicardium  with 6-0 Prolenes.  Cardioplegia was redosed.  While the crossclamp was still in place, 2 proximal vein anastomoses were performed using a running 6-0 Prolene and a 4.5 mm punch.  Prior to tying down final proximal anastomosis air was vented from the coronaries with a dose of retrograde warm blood  cardioplegia.  The crossclamp was removed.  The vein grafts were de-aired and opened and each had good flow and hemostasis was documented at the proximal and distal anastomoses.  The heart resumed a spontaneous rhythm and did not need cardioversion.  The patient was rewarmed and reperfused.   Temporary pacing wires were applied.  The lungs were expanded and ventilator was resumed.  The patient was then weaned from  cardiopulmonary bypass on low dose milrinone with stable hemodynamics.  Echo showed near normal LV function.  Cardiac index was 2.0.  The patient was in atrial paced rhythm.  Protamine was administered without adverse  reaction.  The cannulas were removed.  The patient remained stable.  The patient still had suboptimal hemostasis and was given FFP to improve coagulation parameters.  The patient did receive 2 units of packed cells while on bypass for a hemoglobin that  was less than 7 grams.  With FFP the hemostasis improved.  Anterior mediastinal and left pleural chest tubes were placed and brought out through separate incisions.  The superior pericardial fat was closed over the aorta.  Sternal wires were placed.  The sternum was closed and  the patient remained stable.  The pectoralis fascia was closed with a running #1 Vicryl and subcutaneous and skin layers were closed with running Vicryl.  The patient was then prepared for transfer back to the ICU.  Total bypass time was 123 minutes.   VAI D: 11/21/2022 6:24:59 pm T: 11/21/2022 9:58:00 pm  JOB: [id number]/ [id number]

## 2022-11-21 NOTE — Anesthesia Procedure Notes (Signed)
Procedure Name: Intubation Date/Time: 11/21/2022 7:44 AM  Performed by: Griffin Dakin, CRNAPre-anesthesia Checklist: Patient identified, Emergency Drugs available, Suction available and Patient being monitored Patient Re-evaluated:Patient Re-evaluated prior to induction Oxygen Delivery Method: Circle system utilized Preoxygenation: Pre-oxygenation with 100% oxygen Induction Type: IV induction Ventilation: Mask ventilation without difficulty Laryngoscope Size: Mac and 3 Grade View: Grade II Tube type: Oral Tube size: 8.0 mm Number of attempts: 1 Airway Equipment and Method: Stylet and Oral airway Placement Confirmation: ETT inserted through vocal cords under direct vision, positive ETCO2 and breath sounds checked- equal and bilateral Secured at: 22 cm Tube secured with: Tape Dental Injury: Teeth and Oropharynx as per pre-operative assessment

## 2022-11-21 NOTE — Progress Notes (Signed)
  Echocardiogram Echocardiogram Transesophageal has been performed.  Bobbye Charleston 11/21/2022, 7:51 AM

## 2022-11-21 NOTE — Procedures (Signed)
Extubation Procedure Note  Patient Details:   Name: Mercedes Adams DOB: 1956/11/28 MRN: [id number]   Airway Documentation:    Vent end date: 11/21/22 Vent end time: 1845   Evaluation  O2 sats: stable throughout Complications: No apparent complications Patient did tolerate procedure well. Bilateral Breath Sounds: Clear   Yes Pt was successfully extubated to 4L [place]. Prior extubation Pt performed -23cmH2O and 3.6L VC. Pt is able to state name and is currently stable at this time.  Felecia Jan 11/21/2022, 6:55 PM

## 2022-11-21 NOTE — Progress Notes (Signed)
CT surgery p.m. rounds  Status post CABG x 3 for ACS, preoperative anemia from upper GI bleed/duodenal ulcer  Patient extubated with stable hemodynamics Postoperative hematocrit 27% Chest tube output satisfactory  Blood pressure 98/65, pulse 88, temperature 98.1 F (36.7 C), resp. rate 15, height '5\' 4"'$  (1.626 m), weight 79 kg, SpO2 99 %.

## 2022-11-21 NOTE — Anesthesia Procedure Notes (Signed)
Central Venous Catheter Insertion Performed by: Albertha Ghee, MD, anesthesiologist Start/End11/22/2023 7:05 AM, 11/21/2022 7:10 AM Patient location: Pre-op. Preanesthetic checklist: patient identified, IV checked, site marked, risks and benefits discussed, surgical consent, monitors and equipment checked, pre-op evaluation, timeout performed and anesthesia consent Hand hygiene performed  and maximum sterile barriers used  PA cath was placed.Swan type:thermodilution Procedure performed without using ultrasound guided technique. Attempts: 1 Patient tolerated the procedure well with no immediate complications.

## 2022-11-21 NOTE — Hospital Course (Addendum)
History of present illness:  Patient was evaluated for coronary bypass grafting by Dr. Roxan Hockey after she sustained a non-STEMI during admission for upper GI bleeding from a duodenal ulcer with hemoglobin of 6.5.  The ulcer was treated endoscopically and she was transfused 2 units of packed cells.  Surgery was scheduled after period of recovery however she presented today with unstable angina and is now admitted.  Her hemoglobin is now 10.7.  Her angina is controlled on IV heparin.  She will be scheduled for multivessel CABG on Wednesday, November 22.   Dr. Darcey Nora discussed the procedure of CABG in detail with the patient and her sister and she understands the benefits and risks and agrees to proceed.  She was found to be stable to proceed on 11/21/2022.  Hospital course: Patient was medically stabilized and 11/21/2022 she was taken the operating room.  She underwent the following procedure: Coronary artery bypass grafting times x 3.  LIMA to diagonal, saphenous vein graft to OM, saphenous vein graft to PDA were placed.  Tolerated procedure well was taken to the surgical intensive care unit in stable condition.  The patient was extubated the evening of surgery.  She was weaned off Levophed and Milrinone as hemodynamics allowed.  The patient was started on diuretics for volume overloaded state.  Her chest tubes and arterial lines were removed without difficulty.   She did go into postoperative atrial fibrillation and has been started on oral amiodarone with subsequent chemical cardioversion to sinus rhythm.  She is tolerating routine ambulation or cardiac rehab protocols.  Incisions are healing well without evidence of infection.  She does have an expected acute blood loss anemia which is stable.  She has been started on an iron supplement.  She had some expected postoperative volume overload but has responded well to diuretics.  She is now at her baseline weight.  She has nobody who can stay with her and  will require short-term skilled nursing  which has been arranged at the [facility] facility.  At the time of discharge the patient is felt to be quite stable.

## 2022-11-21 NOTE — Progress Notes (Signed)
Pre Procedure note for inpatients:   Mercedes Adams has been scheduled for Procedure(s): CORONARY ARTERY BYPASS GRAFTING (CABG) (N/A) TRANSESOPHAGEAL ECHOCARDIOGRAM (TEE) (N/A) today. The various methods of treatment have been discussed with the patient. After consideration of the risks, benefits and treatment options the patient has consented to the planned procedure.   The patient has been seen and labs reviewed. There are no changes in the patient's condition to prevent proceeding with the planned procedure today.  Recent labs:  Lab Results  Component Value Date   WBC 7.6 11/21/2022   HGB 10.5 (L) 11/21/2022   HCT 32.7 (L) 11/21/2022   PLT 280 11/21/2022   GLUCOSE 148 (H) 11/21/2022   CHOL 137 11/02/2022   TRIG 179 (H) 11/02/2022   HDL 31 (L) 11/02/2022   LDLDIRECT 79 11/02/2022   LDLCALC 70 11/02/2022   ALT 13 11/19/2022   AST 17 11/19/2022   NA 140 11/21/2022   K 4.0 11/21/2022   CL 105 11/21/2022   CREATININE 0.89 11/21/2022   BUN 18 11/21/2022   CO2 26 11/21/2022   TSH 2.254 11/19/2022   INR 1.1 11/02/2022   HGBA1C 8.0 (H) 11/01/2022    Dahlia Byes, MD 11/21/2022 7:24 AM

## 2022-11-21 NOTE — Anesthesia Procedure Notes (Signed)
Central Venous Catheter Insertion Performed by: Albertha Ghee, MD, anesthesiologist Start/End11/22/2023 6:55 AM, 11/21/2022 7:06 AM Patient location: Pre-op. Preanesthetic checklist: patient identified, IV checked, site marked, risks and benefits discussed, surgical consent, monitors and equipment checked, pre-op evaluation, timeout performed and anesthesia consent Position: Trendelenburg Lidocaine 1% used for infiltration and patient sedated Hand hygiene performed , maximum sterile barriers used  and Seldinger technique used Catheter size: 9 Fr Central line was placed.MAC introducer Swan type:thermodilation Procedure performed using ultrasound guided technique. Ultrasound Notes:anatomy identified, needle tip was noted to be adjacent to the nerve/plexus identified, no ultrasound evidence of intravascular and/or intraneural injection and image(s) printed for medical record Attempts: 1 Following insertion, line sutured, dressing applied and Biopatch. Post procedure assessment: blood return through all ports, free fluid flow and no air  Patient tolerated the procedure well with no immediate complications.

## 2022-11-21 NOTE — Anesthesia Preprocedure Evaluation (Signed)
Anesthesia Evaluation  Patient identified by MRN, date of birth, ID band Patient awake    Reviewed: Allergy & Precautions, H&P , NPO status , Patient's Chart, lab work & pertinent test results  Airway Mallampati: II   Neck ROM: full    Dental   Pulmonary neg pulmonary ROS   breath sounds clear to auscultation       Cardiovascular hypertension, + angina  + CAD and + Past MI   Rhythm:regular Rate:Normal  TTE (11/02/22): EF 50-55%, mild-mod MR   Neuro/Psych   Anxiety        GI/Hepatic PUD,,,Recent GI bleeding s/p EGD and treatment of upper GI bleed (11/02/22)   Endo/Other  diabetes, Type 2    Renal/GU      Musculoskeletal  (+) Arthritis ,    Abdominal   Peds  Hematology  (+) Blood dyscrasia, anemia   Anesthesia Other Findings   Reproductive/Obstetrics                             Anesthesia Physical Anesthesia Plan  ASA: 4  Anesthesia Plan: General   Post-op Pain Management:    Induction: Intravenous  PONV Risk Score and Plan: 3 and Ondansetron, Dexamethasone, Midazolam and Treatment may vary due to age or medical condition  Airway Management Planned: Oral ETT  Additional Equipment: Arterial line, CVP, PA Cath, TEE and Ultrasound Guidance Line Placement  Intra-op Plan:   Post-operative Plan: Extubation in OR  Informed Consent: I have reviewed the patients History and Physical, chart, labs and discussed the procedure including the risks, benefits and alternatives for the proposed anesthesia with the patient or authorized representative who has indicated his/her understanding and acceptance.     Dental advisory given  Plan Discussed with: CRNA, Anesthesiologist and Surgeon  Anesthesia Plan Comments:        Anesthesia Quick Evaluation

## 2022-11-21 NOTE — Transfer of Care (Signed)
Immediate Anesthesia Transfer of Care Note  Patient: Mercedes Adams  Procedure(s) Performed: CORONARY ARTERY BYPASS GRAFTING (CABG) X, USING LEFT INTERNAL MAMMARY ARTERY AND ENDOSCOPICALLY HARVESTED RIGHT GREATER SAPHENOUS VEIN. (Chest) TRANSESOPHAGEAL ECHOCARDIOGRAM (TEE)  Patient Location: ICU  Anesthesia Type:General  Level of Consciousness: Patient remains intubated per anesthesia plan  Airway & Oxygen Therapy: Patient remains intubated per anesthesia plan and Patient placed on Ventilator (see vital sign flow sheet for setting)  Post-op Assessment: Report given to RN and Post -op Vital signs reviewed and stable  Post vital signs: Reviewed and stable  Last Vitals:  Vitals Value Taken Time  BP 104/56 11/21/22 1423  Temp    Pulse 82 11/21/22 1428  Resp 16 11/21/22 1428  SpO2 98 % 11/21/22 1428  Vitals shown include unvalidated device data.  Last Pain:  Vitals:   11/21/22 0600  TempSrc: Oral  PainSc:       Patients Stated Pain Goal: 0 (99/35/70 1779)  Complications: No notable events documented.

## 2022-11-21 NOTE — Brief Op Note (Signed)
11/19/2022 - 11/21/2022  8:29 AM  PATIENT:  Mercedes Adams  66 y.o. female  PRE-OPERATIVE DIAGNOSIS:  CAD  POST-OPERATIVE DIAGNOSIS:  CAD  PROCEDURE:  Procedure(s): CORONARY ARTERY BYPASS GRAFTING (CABG) X 3, USING LEFT INTERNAL MAMMARY ARTERY AND ENDOSCOPICALLY HARVESTED RIGHT GREATER SAPHENOUS VEIN. (N/A) TRANSESOPHAGEAL ECHOCARDIOGRAM (TEE) (N/A) Vein harvest time: 25mn Vein prep time: 139m LIMA-DIAG SVG-PD SVG-OM  SURGEON:  Surgeon(s) and Role:    * Dahlia ByesMD - Primary  PHYSICIAN ASSISTANT: Semone Orlov PA-C, BAWynelle BeckmannA-C  ASSISTANTS: RNFA STAFF  ANESTHESIA:   general  EBL: 7642mTS  BLOOD ADMINISTERED:blood bank 1130 ml  DRAINS:  LEFT PLEURAL AND MEDIASTINAL CHEST TUBES    LOCAL MEDICATIONS USED:  NONE  SPECIMEN:  No Specimen  DISPOSITION OF SPECIMEN:  N/A  COUNTS:  YES  TOURNIQUET:  * No tourniquets in log *  DICTATION: .Other Dictation: Dictation Number PENDING  PLAN OF CARE: Admit to inpatient   PATIENT DISPOSITION:  ICU - intubated and hemodynamically stable.   Delay start of Pharmacological VTE agent (>24hrs) due to surgical blood loss or risk of bleeding: yes  COMPLICATIONS: NO KNOWN

## 2022-11-21 NOTE — Anesthesia Procedure Notes (Signed)
Arterial Line Insertion Start/End11/22/2023 7:15 AM, 11/21/2022 7:20 AM Performed by: Albertha Ghee, MD, Janace Litten, CRNA, CRNA  Patient location: Pre-op. Preanesthetic checklist: patient identified, IV checked, site marked, risks and benefits discussed, surgical consent, monitors and equipment checked, pre-op evaluation, timeout performed and anesthesia consent Lidocaine 1% used for infiltration Left, radial was placed Catheter size: 20 G Hand hygiene performed  and maximum sterile barriers used   Attempts: 3 Procedure performed without using ultrasound guided technique. Following insertion, dressing applied and Biopatch. Post procedure assessment: normal and unchanged  Post procedure complications: unsuccessful attempts and second provider assisted. Patient tolerated the procedure well with no immediate complications.

## 2022-11-22 ENCOUNTER — Inpatient Hospital Stay (HOSPITAL_COMMUNITY): Payer: Medicare Other

## 2022-11-22 ENCOUNTER — Encounter (HOSPITAL_COMMUNITY): Payer: Self-pay | Admitting: Cardiothoracic Surgery

## 2022-11-22 LAB — PREPARE FRESH FROZEN PLASMA
Unit division: 0
Unit division: 0

## 2022-11-22 LAB — CBC
HCT: 26.4 % — ABNORMAL LOW (ref 36.0–46.0)
HCT: 27.6 % — ABNORMAL LOW (ref 36.0–46.0)
Hemoglobin: 8.3 g/dL — ABNORMAL LOW (ref 12.0–15.0)
Hemoglobin: 8.7 g/dL — ABNORMAL LOW (ref 12.0–15.0)
MCH: 28.2 pg (ref 26.0–34.0)
MCH: 28.1 pg (ref 26.0–34.0)
MCHC: 31.4 g/dL (ref 30.0–36.0)
MCHC: 31.5 g/dL (ref 30.0–36.0)
MCV: 89.8 fL (ref 80.0–100.0)
MCV: 89 fL (ref 80.0–100.0)
Platelets: 174 10*3/uL (ref 150–400)
Platelets: 162 10*3/uL (ref 150–400)
RBC: 2.94 MIL/uL — ABNORMAL LOW (ref 3.87–5.11)
RBC: 3.1 MIL/uL — ABNORMAL LOW (ref 3.87–5.11)
RDW: 14.4 % (ref 11.5–15.5)
RDW: 14.6 % (ref 11.5–15.5)
WBC: 13 10*3/uL — ABNORMAL HIGH (ref 4.0–10.5)
WBC: 13.1 10*3/uL — ABNORMAL HIGH (ref 4.0–10.5)
nRBC: 0 % (ref 0.0–0.2)
nRBC: 0 % (ref 0.0–0.2)

## 2022-11-22 LAB — BASIC METABOLIC PANEL
Anion gap: 16 — ABNORMAL HIGH (ref 5–15)
Anion gap: 8 (ref 5–15)
BUN: 9 mg/dL (ref 8–23)
BUN: 9 mg/dL (ref 8–23)
CO2: 20 mmol/L — ABNORMAL LOW (ref 22–32)
CO2: 21 mmol/L — ABNORMAL LOW (ref 22–32)
Calcium: 8.4 mg/dL — ABNORMAL LOW (ref 8.9–10.3)
Calcium: 8.2 mg/dL — ABNORMAL LOW (ref 8.9–10.3)
Chloride: 103 mmol/L (ref 98–111)
Chloride: 107 mmol/L (ref 98–111)
Creatinine, Ser: 0.9 mg/dL (ref 0.44–1.00)
Creatinine, Ser: 0.84 mg/dL (ref 0.44–1.00)
GFR, Estimated: >60 mL/min (ref >60)
GFR, Estimated: >60 mL/min (ref >60)
Glucose, Bld: 106 mg/dL — ABNORMAL HIGH (ref 70–99)
Glucose, Bld: 143 mg/dL — ABNORMAL HIGH (ref 70–99)
Potassium: 3.8 mmol/L (ref 3.5–5.1)
Potassium: 4.3 mmol/L (ref 3.5–5.1)
Sodium: 139 mmol/L (ref 135–145)
Sodium: 136 mmol/L (ref 135–145)

## 2022-11-22 LAB — BPAM FFP
Blood Product Expiration Date: 202311252359
Blood Product Expiration Date: 202311252359
ISSUE DATE / TIME: 202311221200
ISSUE DATE / TIME: 202311221200
Unit Type and Rh: 6200
Unit Type and Rh: 6200

## 2022-11-22 LAB — GLUCOSE, CAPILLARY
Glucose-Capillary: 105 mg/dL — ABNORMAL HIGH (ref 70–99)
Glucose-Capillary: 107 mg/dL — ABNORMAL HIGH (ref 70–99)
Glucose-Capillary: 107 mg/dL — ABNORMAL HIGH (ref 70–99)
Glucose-Capillary: 111 mg/dL — ABNORMAL HIGH (ref 70–99)
Glucose-Capillary: 112 mg/dL — ABNORMAL HIGH (ref 70–99)
Glucose-Capillary: 114 mg/dL — ABNORMAL HIGH (ref 70–99)
Glucose-Capillary: 114 mg/dL — ABNORMAL HIGH (ref 70–99)
Glucose-Capillary: 115 mg/dL — ABNORMAL HIGH (ref 70–99)
Glucose-Capillary: 116 mg/dL — ABNORMAL HIGH (ref 70–99)
Glucose-Capillary: 124 mg/dL — ABNORMAL HIGH (ref 70–99)
Glucose-Capillary: 132 mg/dL — ABNORMAL HIGH (ref 70–99)
Glucose-Capillary: 133 mg/dL — ABNORMAL HIGH (ref 70–99)
Glucose-Capillary: 138 mg/dL — ABNORMAL HIGH (ref 70–99)
Glucose-Capillary: 93 mg/dL (ref 70–99)

## 2022-11-22 LAB — MAGNESIUM
Magnesium: 2.2 mg/dL (ref 1.7–2.4)
Magnesium: 1.9 mg/dL (ref 1.7–2.4)

## 2022-11-22 LAB — ECHO INTRAOPERATIVE TEE
Height: 64 in
Weight: 2786.61 oz

## 2022-11-22 MED ORDER — INSULIN ASPART 100 UNIT/ML IJ SOLN
0.0000 [IU] | INTRAMUSCULAR | Status: DC
  Administered 2022-11-22 (×3): 2 [IU] via SUBCUTANEOUS

## 2022-11-22 MED ORDER — ENOXAPARIN SODIUM 40 MG/0.4ML IJ SOSY
40.0000 mg | PREFILLED_SYRINGE | Freq: Every day | INTRAMUSCULAR | Status: DC
  Administered 2022-11-22 – 2022-11-26 (×5): 40 mg via SUBCUTANEOUS
  Filled 2022-11-22 (×5): qty 0.4

## 2022-11-22 MED ORDER — INSULIN DETEMIR 100 UNIT/ML ~~LOC~~ SOLN
5.0000 [IU] | Freq: Once | SUBCUTANEOUS | Status: AC
  Administered 2022-11-22: 5 [IU] via SUBCUTANEOUS
  Filled 2022-11-22: qty 0.05

## 2022-11-22 MED ORDER — INSULIN DETEMIR 100 UNIT/ML ~~LOC~~ SOLN
5.0000 [IU] | Freq: Every day | SUBCUTANEOUS | Status: DC
  Administered 2022-11-23 – 2022-11-27 (×5): 5 [IU] via SUBCUTANEOUS
  Filled 2022-11-22 (×5): qty 0.05

## 2022-11-22 NOTE — Anesthesia Postprocedure Evaluation (Signed)
Anesthesia Post Note  Patient: Mercedes Adams  Procedure(s) Performed: CORONARY ARTERY BYPASS GRAFTING (CABG) X 3, USING LEFT INTERNAL MAMMARY ARTERY AND ENDOSCOPICALLY HARVESTED RIGHT GREATER SAPHENOUS VEIN. (Chest) TRANSESOPHAGEAL ECHOCARDIOGRAM (TEE)     Patient location during evaluation: SICU Anesthesia Type: General Level of consciousness: sedated Pain management: pain level controlled Vital Signs Assessment: post-procedure vital signs reviewed and stable Respiratory status: patient remains intubated per anesthesia plan Cardiovascular status: stable Postop Assessment: no apparent nausea or vomiting Anesthetic complications: no   No notable events documented.  Last Vitals:  Vitals:   11/22/22 0600 11/22/22 0700  BP: (!) 109/58 (!) 103/56  Pulse: 96 92  Resp: 19 (!) 22  Temp: 37.2 C 37.2 C  SpO2: 97% 96%    Last Pain:  Vitals:   11/22/22 0630  TempSrc:   PainSc: 0-No pain                 Charmelle Soh S

## 2022-11-22 NOTE — Progress Notes (Signed)
     [address]       ,[place] [address]             [phone]       EVENING ROUNDS  Had a good day Will DC art line Hopefully transfer to floor tomorrow

## 2022-11-22 NOTE — Progress Notes (Signed)
[address]       [place] [address]             [phone]      1 Day Post-Op  Procedure(s) (LRB): CORONARY ARTERY BYPASS GRAFTING (CABG) X 3, USING LEFT INTERNAL MAMMARY ARTERY AND ENDOSCOPICALLY HARVESTED RIGHT GREATER SAPHENOUS VEIN. (N/A) TRANSESOPHAGEAL ECHOCARDIOGRAM (TEE) (N/A)   Total Length of Stay:  LOS: 3 days   SUBJECTIVE: Feels well. No complaints. Stable night  Vitals:   11/22/22 0800 11/22/22 0815  BP: 117/62   Pulse: 99 99  Resp: (!) 23 19  Temp: 99 F (37.2 C) 98.8 F (37.1 C)  SpO2: 96% 96%    Intake/Output      11/22 0701 11/23 0700 11/23 0701 11/24 0700   P.O.     I.V. (mL/kg) 4397.4 (53.3)    Blood 977    IV Piggyback 1180    Total Intake(mL/kg) 6554.3 (79.4)    Urine (mL/kg/hr) 3385 (1.7)    Chest Tube 540    Total Output 3925    Net +2629.3             sodium chloride 20 mL/hr at 11/22/22 0700   sodium chloride     sodium chloride 10 mL/hr at 11/22/22 0700   albumin human Stopped (11/21/22 2301)    ceFAZolin (ANCEF) IV Stopped (11/22/22 0622)   dexmedetomidine (PRECEDEX) IV infusion Stopped (11/21/22 2259)   insulin 0.9 Units/hr (11/22/22 0700)   lactated ringers     lactated ringers     lactated ringers 20 mL/hr at 11/22/22 0700   milrinone 0.25 mcg/kg/min (11/22/22 0700)   nitroGLYCERIN Stopped (11/21/22 1515)   norepinephrine (LEVOPHED) Adult infusion 2 mcg/min (11/22/22 0700)   phenylephrine (NEO-SYNEPHRINE) Adult infusion Stopped (11/21/22 2307)    CBC    Component Value Date/Time   WBC 13.0 (H) 11/22/2022 0327   RBC 2.94 (L) 11/22/2022 0327   HGB 8.3 (L) 11/22/2022 0327   HCT 26.4 (L) 11/22/2022 0327   PLT 174 11/22/2022 0327   MCV 89.8 11/22/2022 0327   MCH 28.2 11/22/2022 0327   MCHC 31.4 11/22/2022 0327   RDW 14.4 11/22/2022 0327   LYMPHSABS 2.2 11/20/2022 0352   MONOABS 0.6 11/20/2022 0352   EOSABS 0.2 11/20/2022 0352   BASOSABS 0.1 11/20/2022 0352   CMP     Component Value  Date/Time   NA 139 11/22/2022 0327   K 3.8 11/22/2022 0327   CL 103 11/22/2022 0327   CO2 20 (L) 11/22/2022 0327   GLUCOSE 106 (H) 11/22/2022 0327   BUN 9 11/22/2022 0327   CREATININE 0.90 11/22/2022 0327   CALCIUM 8.4 (L) 11/22/2022 0327   PROT 7.0 11/19/2022 1335   ALBUMIN 4.0 11/19/2022 1335   AST 17 11/19/2022 1335   ALT 13 11/19/2022 1335   ALKPHOS 47 11/19/2022 1335   BILITOT 0.4 11/19/2022 1335   GFRNONAA >60 11/22/2022 0327   GFRAA >60 10/17/2017 1329   ABG    Component Value Date/Time   PHART 7.344 (L) 11/21/2022 1950   PCO2ART 44.4 11/21/2022 1950   PO2ART 121 (H) 11/21/2022 1950   HCO3 24.2 11/21/2022 1950   TCO2 26 11/21/2022 1950   ACIDBASEDEF 2.0 11/21/2022 1950   O2SAT 98 11/21/2022 1950   CBG (last 3)  Recent Labs    11/22/22 0610 11/22/22 0719 11/22/22 0812  GLUCAP 111* 107* 138*  EXAM Lungs:overall clear Card: rr Ext: warm and well perfused   ASSESSMENT: POD #  1 SP CABG x 3 Doing well with good CI. Off Levo. Will DC milrinone and remove swan Leave CT today Transition Insulin drip   Coralie Common, MD '@DATE'$ @

## 2022-11-23 ENCOUNTER — Inpatient Hospital Stay (HOSPITAL_COMMUNITY): Payer: Medicare Other

## 2022-11-23 LAB — BASIC METABOLIC PANEL
Anion gap: 11 (ref 5–15)
BUN: 10 mg/dL (ref 8–23)
CO2: 24 mmol/L (ref 22–32)
Calcium: 8.4 mg/dL — ABNORMAL LOW (ref 8.9–10.3)
Chloride: 103 mmol/L (ref 98–111)
Creatinine, Ser: 0.88 mg/dL (ref 0.44–1.00)
GFR, Estimated: >60 mL/min (ref >60)
Glucose, Bld: 107 mg/dL — ABNORMAL HIGH (ref 70–99)
Potassium: 4.1 mmol/L (ref 3.5–5.1)
Sodium: 138 mmol/L (ref 135–145)

## 2022-11-23 LAB — CBC
HCT: 25.4 % — ABNORMAL LOW (ref 36.0–46.0)
Hemoglobin: 7.7 g/dL — ABNORMAL LOW (ref 12.0–15.0)
MCH: 27.7 pg (ref 26.0–34.0)
MCHC: 30.3 g/dL (ref 30.0–36.0)
MCV: 91.4 fL (ref 80.0–100.0)
Platelets: 129 10*3/uL — ABNORMAL LOW (ref 150–400)
RBC: 2.78 MIL/uL — ABNORMAL LOW (ref 3.87–5.11)
RDW: 14.5 % (ref 11.5–15.5)
WBC: 11.4 10*3/uL — ABNORMAL HIGH (ref 4.0–10.5)
nRBC: 0 % (ref 0.0–0.2)

## 2022-11-23 LAB — GLUCOSE, CAPILLARY
Glucose-Capillary: 101 mg/dL — ABNORMAL HIGH (ref 70–99)
Glucose-Capillary: 135 mg/dL — ABNORMAL HIGH (ref 70–99)
Glucose-Capillary: 172 mg/dL — ABNORMAL HIGH (ref 70–99)
Glucose-Capillary: 195 mg/dL — ABNORMAL HIGH (ref 70–99)
Glucose-Capillary: 138 mg/dL — ABNORMAL HIGH (ref 70–99)

## 2022-11-23 MED ORDER — FE FUM-VIT C-VIT B12-FA 460-60-0.01-1 MG PO CAPS
1.0000 | ORAL_CAPSULE | Freq: Every day | ORAL | Status: DC
  Administered 2022-11-23 – 2022-11-27 (×5): 1 via ORAL
  Filled 2022-11-23 (×5): qty 1

## 2022-11-23 MED ORDER — FUROSEMIDE 40 MG PO TABS
40.0000 mg | ORAL_TABLET | Freq: Every day | ORAL | Status: DC
  Administered 2022-11-23 – 2022-11-27 (×5): 40 mg via ORAL
  Filled 2022-11-23 (×5): qty 1

## 2022-11-23 MED ORDER — MUPIROCIN 2 % EX OINT
TOPICAL_OINTMENT | Freq: Two times a day (BID) | CUTANEOUS | Status: DC
  Filled 2022-11-23 (×2): qty 22

## 2022-11-23 MED ORDER — INSULIN ASPART 100 UNIT/ML IJ SOLN: 0.0000 [IU] | Freq: Three times a day (TID) | INTRAMUSCULAR | Status: DC

## 2022-11-23 MED ORDER — POTASSIUM CHLORIDE CRYS ER 20 MEQ PO TBCR
20.0000 meq | EXTENDED_RELEASE_TABLET | Freq: Every day | ORAL | Status: DC
  Administered 2022-11-23: 20 meq via ORAL
  Filled 2022-11-23 (×2): qty 1

## 2022-11-23 MED ORDER — INSULIN ASPART 100 UNIT/ML IJ SOLN
0.0000 [IU] | Freq: Three times a day (TID) | INTRAMUSCULAR | Status: DC
  Administered 2022-11-23: 2 [IU] via SUBCUTANEOUS
  Administered 2022-11-23: 4 [IU] via SUBCUTANEOUS
  Administered 2022-11-23: 2 [IU] via SUBCUTANEOUS
  Administered 2022-11-23: 4 [IU] via SUBCUTANEOUS
  Administered 2022-11-24: 8 [IU] via SUBCUTANEOUS
  Administered 2022-11-24 – 2022-11-25 (×3): 4 [IU] via SUBCUTANEOUS
  Administered 2022-11-25 – 2022-11-26 (×3): 2 [IU] via SUBCUTANEOUS

## 2022-11-23 MED ORDER — FUROSEMIDE 10 MG/ML IJ SOLN
20.0000 mg | Freq: Once | INTRAMUSCULAR | Status: AC
  Administered 2022-11-23: 20 mg via INTRAVENOUS
  Filled 2022-11-23: qty 2

## 2022-11-23 NOTE — Progress Notes (Signed)
     [address]       [place], [address]             [phone]       EVENING ROUNDS  Had a very good day Hopefully transfer to floor tomorrow

## 2022-11-23 NOTE — Discharge Instructions (Signed)

## 2022-11-23 NOTE — Progress Notes (Addendum)
[facility] DAILY ICU PROGRESS NOTE                   [address]            [place] [address]          [phone]   2 Days Post-Op Procedure(s) (LRB): CORONARY ARTERY BYPASS GRAFTING (CABG) X 3, USING LEFT INTERNAL MAMMARY ARTERY AND ENDOSCOPICALLY HARVESTED RIGHT GREATER SAPHENOUS VEIN. (N/A) TRANSESOPHAGEAL ECHOCARDIOGRAM (TEE) (N/A)  Total Length of Stay:  LOS: 4 days   Subjective: Feels pretty well  Objective: Vital signs in last 24 hours: Temp:  [97.6 F (36.4 C)-99.1 F (37.3 C)] 97.6 F (36.4 C) (11/24 0000) Pulse Rate:  [76-99] 97 (11/24 0600) Cardiac Rhythm: Normal sinus rhythm (11/24 0000) Resp:  [15-27] 27 (11/24 0600) BP: (82-141)/(36-71) 141/71 (11/24 0600) SpO2:  [96 %-100 %] 99 % (11/24 0600) Arterial Line BP: (110-152)/(44-57) 134/57 (11/23 1800) Weight:  [85.7 kg] 85.7 kg (11/24 0500)  Filed Weights   11/19/22 1240 11/22/22 0500 11/23/22 0500  Weight: 79 kg 82.5 kg 85.7 kg    Weight change: 3.2 kg   Hemodynamic parameters for last 24 hours: PAP: (26-35)/(15-21) 26/15 CVP:  [10 mmHg-11 mmHg] 11 mmHg CO:  [6.1 L/min] 6.1 L/min CI:  [3.3 L/min/m2] 3.3 L/min/m2  Intake/Output from previous day: 11/23 0701 - 11/24 0700 In: 444 [I.V.:344; IV Piggyback:100] Out: 925 [Urine:705; Chest Tube:220]  Intake/Output this shift: No intake/output data recorded.  Current Meds: Scheduled Meds:  acetaminophen  1,000 mg Oral Q6H   Or   acetaminophen (TYLENOL) oral liquid 160 mg/5 mL  1,000 mg Per Tube Q6H   aspirin EC  325 mg Oral Daily   Or   aspirin  324 mg Per Tube Daily   atorvastatin  80 mg Oral QHS   bisacodyl  10 mg Oral Daily   Or   bisacodyl  10 mg Rectal Daily   Chlorhexidine Gluconate Cloth  6 each Topical Daily   docusate sodium  200 mg Oral Daily   enoxaparin (LOVENOX) injection  40 mg Subcutaneous QHS   fenofibrate  160 mg Oral Daily   insulin aspart  0-24 Units Subcutaneous Q4H   insulin detemir  5 Units Subcutaneous Daily    levothyroxine  100 mcg Oral Q0600   metoCLOPramide (REGLAN) injection  10 mg Intravenous Q6H   metoprolol tartrate  12.5 mg Oral BID   Or   metoprolol tartrate  12.5 mg Per Tube BID   mouth rinse  15 mL Mouth Rinse 4 times per day   pantoprazole (PROTONIX) IV  40 mg Intravenous Q12H   sodium chloride flush  3 mL Intravenous Q12H   Continuous Infusions:  sodium chloride Stopped (11/22/22 1010)   sodium chloride     sodium chloride Stopped (11/22/22 1509)   [COMPLETED]  ceFAZolin (ANCEF) IV 2 g (11/23/22 0700)   dexmedetomidine (PRECEDEX) IV infusion Stopped (11/21/22 2259)   insulin Stopped (11/22/22 1432)   lactated ringers     lactated ringers     lactated ringers Stopped (11/22/22 1509)   milrinone Stopped (11/22/22 1900)   nitroGLYCERIN Stopped (11/21/22 1515)   norepinephrine (LEVOPHED) Adult infusion Stopped (11/22/22 0756)   phenylephrine (NEO-SYNEPHRINE) Adult infusion Stopped (11/21/22 2307)   PRN Meds:.sodium chloride, dextrose, lactated ringers, metoprolol tartrate, midazolam, morphine injection, ondansetron (ZOFRAN) IV, mouth rinse, oxyCODONE, sodium chloride flush, traMADol  General appearance: alert, cooperative, and no distress Heart: regular rate and rhythm Lungs: mildly dim in bases with  some crackles Abdomen: benign Extremities: minor peripheral edema Wound: dressing CDI  Lab Results: CBC: Recent Labs    11/22/22 1700 11/23/22 0418  WBC 13.1* 11.4*  HGB 8.7* 7.7*  HCT 27.6* 25.4*  PLT 162 129*   BMET:  Recent Labs    11/22/22 1700 11/23/22 0418  NA 136 138  K 4.3 4.1  CL 107 103  CO2 21* 24  GLUCOSE 143* 107*  BUN 9 10  CREATININE 0.84 0.88  CALCIUM 8.2* 8.4*    CMET: Lab Results  Component Value Date   WBC 11.4 (H) 11/23/2022   HGB 7.7 (L) 11/23/2022   HCT 25.4 (L) 11/23/2022   PLT 129 (L) 11/23/2022   GLUCOSE 107 (H) 11/23/2022   CHOL 137 11/02/2022   TRIG 179 (H) 11/02/2022   HDL 31 (L) 11/02/2022   LDLDIRECT 79 11/02/2022    LDLCALC 70 11/02/2022   ALT 13 11/19/2022   AST 17 11/19/2022   NA 138 11/23/2022   K 4.1 11/23/2022   CL 103 11/23/2022   CREATININE 0.88 11/23/2022   BUN 10 11/23/2022   CO2 24 11/23/2022   TSH 2.254 11/19/2022   INR 1.4 (H) 11/21/2022   HGBA1C 8.0 (H) 11/01/2022      PT/INR:  Recent Labs    11/21/22 1430  LABPROT 16.6*  INR 1.4*   Radiology: No results found.   Assessment/Plan: S/P Procedure(s) (LRB): CORONARY ARTERY BYPASS GRAFTING (CABG) X 3, USING LEFT INTERNAL MAMMARY ARTERY AND ENDOSCOPICALLY HARVESTED RIGHT GREATER SAPHENOUS VEIN. (N/A) TRANSESOPHAGEAL ECHOCARDIOGRAM (TEE) (N/A) POD#2  1 afeb, S BP 82-141, sinus rhythm, stable hemodynamics, no pressors or inotropes 2 sats good on 2 liters 3 CT- 220  d/c today 4 fair UOP, weight up 6 kg if accurate- will need to diurese, will start lasix 40 mg daily for now 5 good CBG control 6 normal renal fxn 7 reactive leukocytosis, improved trend 8 expected ABLA fairly stable but slow trend lower 9 minor thrombocytopenia- trended lower, observe closely 10 CXR- basilar atx w small effusions 11 push pulm hygiene and rehab modalities 12 will need SNF short term, will consult TOC John Giovanni PA-C Pager [phone]  11/23/2022 7:07 AM  DC chest tube after ambulation Agree with above note. Will need SNF at [facility] in [place] today  patient examined and medical record reviewed,agree with above note. Mercedes Adams 11/23/2022

## 2022-11-24 ENCOUNTER — Inpatient Hospital Stay (HOSPITAL_COMMUNITY): Payer: Medicare Other

## 2022-11-24 LAB — TYPE AND SCREEN
ABO/RH(D): O NEG
Antibody Screen: NEGATIVE
Unit division: 0
Unit division: 0
Unit division: 0
Unit division: 0
Unit division: 0
Unit division: 0

## 2022-11-24 LAB — BPAM RBC
Blood Product Expiration Date: 202311272359
Blood Product Expiration Date: 202312052359
Blood Product Expiration Date: 202312062359
Blood Product Expiration Date: 202312062359
Blood Product Expiration Date: 202312082359
Blood Product Expiration Date: 202312082359
ISSUE DATE / TIME: 202311220935
ISSUE DATE / TIME: 202311220935
ISSUE DATE / TIME: 202311230948
ISSUE DATE / TIME: 202311231529
Unit Type and Rh: 9500
Unit Type and Rh: 9500
Unit Type and Rh: 9500
Unit Type and Rh: 9500
Unit Type and Rh: 9500
Unit Type and Rh: 9500

## 2022-11-24 LAB — CBC
HCT: 25.2 % — ABNORMAL LOW (ref 36.0–46.0)
Hemoglobin: 7.7 g/dL — ABNORMAL LOW (ref 12.0–15.0)
MCH: 28.1 pg (ref 26.0–34.0)
MCHC: 30.6 g/dL (ref 30.0–36.0)
MCV: 92 fL (ref 80.0–100.0)
Platelets: 155 10*3/uL (ref 150–400)
RBC: 2.74 MIL/uL — ABNORMAL LOW (ref 3.87–5.11)
RDW: 14.6 % (ref 11.5–15.5)
WBC: 10.3 10*3/uL (ref 4.0–10.5)
nRBC: 0 % (ref 0.0–0.2)

## 2022-11-24 LAB — BASIC METABOLIC PANEL
Anion gap: 12 (ref 5–15)
BUN: 12 mg/dL (ref 8–23)
CO2: 24 mmol/L (ref 22–32)
Calcium: 8.2 mg/dL — ABNORMAL LOW (ref 8.9–10.3)
Chloride: 103 mmol/L (ref 98–111)
Creatinine, Ser: 0.77 mg/dL (ref 0.44–1.00)
GFR, Estimated: >60 mL/min (ref >60)
Glucose, Bld: 92 mg/dL (ref 70–99)
Potassium: 3.4 mmol/L — ABNORMAL LOW (ref 3.5–5.1)
Sodium: 139 mmol/L (ref 135–145)

## 2022-11-24 LAB — GLUCOSE, CAPILLARY
Glucose-Capillary: 115 mg/dL — ABNORMAL HIGH (ref 70–99)
Glucose-Capillary: 168 mg/dL — ABNORMAL HIGH (ref 70–99)
Glucose-Capillary: 201 mg/dL — ABNORMAL HIGH (ref 70–99)
Glucose-Capillary: 178 mg/dL — ABNORMAL HIGH (ref 70–99)

## 2022-11-24 MED ORDER — ORAL CARE MOUTH RINSE: 15.0000 mL | OROMUCOSAL | Status: DC | PRN

## 2022-11-24 MED ORDER — POTASSIUM CHLORIDE CRYS ER 20 MEQ PO TBCR
20.0000 meq | EXTENDED_RELEASE_TABLET | ORAL | Status: AC
  Administered 2022-11-24 (×3): 20 meq via ORAL
  Filled 2022-11-24 (×2): qty 1

## 2022-11-24 MED ORDER — GLUCERNA SHAKE PO LIQD
237.0000 mL | Freq: Three times a day (TID) | ORAL | Status: DC
  Administered 2022-11-24 – 2022-11-26 (×9): 237 mL via ORAL

## 2022-11-24 MED ORDER — POTASSIUM CHLORIDE CRYS ER 20 MEQ PO TBCR
20.0000 meq | EXTENDED_RELEASE_TABLET | Freq: Every day | ORAL | Status: DC
  Administered 2022-11-25 – 2022-11-27 (×3): 20 meq via ORAL
  Filled 2022-11-24 (×3): qty 1

## 2022-11-24 NOTE — Progress Notes (Signed)
Per order, pulled temporary epicardial pacing wires today at 1415. Patient remained on bedrest for 1 hour and experienced no complications.

## 2022-11-24 NOTE — Evaluation (Signed)
Occupational Therapy Evaluation Patient Details Name: Mercedes Adams MRN: [id number] DOB: 1956-07-21 Today's Date: 11/24/2022   History of Present Illness Pt is 66 year old presented to [facility] on  11/19/22 with chest pain. Pt underwent CABG x 3 on 11/21/22. PMH - CAD, recent NSTEMI, recent GI bleed, anxiety, arthritis, DM, HTN,   Clinical Impression   Mercedes Adams was evaluated s/p the above admission list, she is indep at baseline and the PCG of her husband who had recent hip surgery. Upon evaluation pt had functional limitations due to generalized weakness, knowledge of sternal precautions and compensatory techniques, generalized fatigue and balance. Overall she required min A for bed mobility and up to min A for mobility with rollator and cues for precautions. Due to deficits listed below, she requires up to mod A for ADLs. Pt will benefit from OT acutely. Recommend d/c to SNF for continued therapies.    Recommendations for follow up therapy are one component of a multi-disciplinary discharge planning process, led by the attending physician.  Recommendations may be updated based on patient status, additional functional criteria and insurance authorization.   Follow Up Recommendations  Skilled nursing-short term rehab (<3 hours/day)     Assistance Recommended at Discharge Frequent or constant Supervision/Assistance  Patient can return home with the following A little help with walking and/or transfers;A little help with bathing/dressing/bathroom;Assistance with cooking/housework;Direct supervision/assist for medications management;Direct supervision/assist for financial management;Assist for transportation;Help with stairs or ramp for entrance    Functional Status Assessment  Patient has had a recent decline in their functional status and demonstrates the ability to make significant improvements in function in a reasonable and predictable amount of time.  Equipment Recommendations  None recommended  by OT    Recommendations for Other Services       Precautions / Restrictions Precautions Precautions: Sternal;Fall Precaution Booklet Issued: Yes (comment) Restrictions Weight Bearing Restrictions: Yes Other Position/Activity Restrictions: sternal precautions      Mobility Bed Mobility Overal bed mobility: Needs Assistance Bed Mobility: Sit to Sidelying, Rolling Rolling: Min guard       Sit to sidelying: Min assist General bed mobility comments: min A for BLEs back into bed    Transfers Overall transfer level: Needs assistance Equipment used: Rollator (4 wheels) Transfers: Sit to/from Stand Sit to Stand: Min guard           General transfer comment: initial cue for sternal precautions, good carry over to all other transfers      Balance Overall balance assessment: Needs assistance Sitting-balance support: No upper extremity supported, Feet supported Sitting balance-Leahy Scale: Good     Standing balance support: No upper extremity supported, During functional activity Standing balance-Leahy Scale: Fair                             ADL either performed or assessed with clinical judgement   ADL Overall ADL's : Needs assistance/impaired Eating/Feeding: Independent;Sitting   Grooming: Supervision/safety;Standing   Upper Body Bathing: Minimal assistance;Cueing for compensatory techniques;Sitting   Lower Body Bathing: Minimal assistance;Sit to/from stand;Cueing for compensatory techniques   Upper Body Dressing : Minimal assistance;Cueing for compensatory techniques;Sitting   Lower Body Dressing: Minimal assistance;Sit to/from Archivist: Supervision/safety;Rollator (4 wheels);Ambulation   Toileting- Clothing Manipulation and Hygiene: Supervision/safety;Sitting/lateral lean       Functional mobility during ADLs: Supervision/safety;Cueing for safety;Cueing for sequencing;Rollator (4 wheels) General ADL Comments: assist for safety  and cues to maintain sternal precautions  Vision Baseline Vision/History: 1 Wears glasses Vision Assessment?: No apparent visual deficits     Perception Perception Perception Tested?: No   Praxis Praxis Praxis tested?: Not tested    Pertinent Vitals/Pain Pain Assessment Pain Assessment: No/denies pain     Hand Dominance Right   Extremity/Trunk Assessment Upper Extremity Assessment Upper Extremity Assessment: Generalized weakness (dternal precautions, otherwise WFL)   Lower Extremity Assessment Lower Extremity Assessment: Defer to PT evaluation   Cervical / Trunk Assessment Cervical / Trunk Assessment: Other exceptions Cervical / Trunk Exceptions: sternotomy   Communication Communication Communication: No difficulties   Cognition Arousal/Alertness: Awake/alert Behavior During Therapy: Flat affect Overall Cognitive Status: Within Functional Limits for tasks assessed                                 General Comments: overall WFL for simple tasks assessed.Good carryover of sternal precautions during mobility. Pt overall flat with limited verbalizations throughout. Would benefit from higher level cog assessment     General Comments  VSS on RA    Exercises     Shoulder Instructions      Home Living Family/patient expects to be discharged to:: Private residence Living Arrangements: Spouse/significant other Available Help at Discharge: Other (Comment) Type of Home: Mobile home Home Access: Ramped entrance     Home Layout: One level     Bathroom Shower/Tub: Teacher, early years/pre: Standard     Home Equipment: Toilet riser;Grab bars - tub/shower;Cane - single Barista (2 wheels);Shower seat;Standard Environmental consultant   Additional Comments: Pt's husband had recent hip surgery and requires assist. Pt is PCG      Prior Functioning/Environment Prior Level of Function : Independent/Modified Independent;Driving              Mobility Comments: no AD ADLs Comments: drives. Pt admits she was forgetting to take her medications        OT Problem List: Decreased range of motion;Decreased activity tolerance;Impaired balance (sitting and/or standing);Decreased safety awareness;Decreased knowledge of use of DME or AE;Decreased knowledge of precautions;Cardiopulmonary status limiting activity      OT Treatment/Interventions: Self-care/ADL training;Therapeutic exercise;DME and/or AE instruction;Therapeutic activities;Patient/family education;Balance training    OT Goals(Current goals can be found in the care plan section) Acute Rehab OT Goals Patient Stated Goal: to go to rehab OT Goal Formulation: With patient Time For Goal Achievement: 12/08/22 Potential to Achieve Goals: Good ADL Goals Pt Will Perform Upper Body Dressing: with modified independence;sitting Pt Will Perform Lower Body Dressing: with supervision;sit to/from stand Pt Will Transfer to Toilet: with supervision;ambulating Additional ADL Goal #1: Pt will indep maintain sternal precautions 100% of session  OT Frequency: Min 2X/week    Co-evaluation              AM-PAC OT "6 Clicks" Daily Activity     Outcome Measure Help from another person eating meals?: None Help from another person taking care of personal grooming?: A Little Help from another person toileting, which includes using toliet, bedpan, or urinal?: A Little Help from another person bathing (including washing, rinsing, drying)?: A Little Help from another person to put on and taking off regular upper body clothing?: A Little Help from another person to put on and taking off regular lower body clothing?: A Little 6 Click Score: 19   End of Session Equipment Utilized During Treatment: Rollator (4 wheels) Nurse Communication: Mobility status  Activity Tolerance: Patient tolerated treatment well Patient  left: in bed;with call bell/phone within reach;with bed alarm set;with  family/visitor present  OT Visit Diagnosis: Unsteadiness on feet (R26.81);Other abnormalities of gait and mobility (R26.89);Muscle weakness (generalized) (M62.81)                Time: 9767-3419 OT Time Calculation (min): 17 min Charges:  OT General Charges $OT Visit: 1 Visit OT Evaluation $OT Eval Moderate Complexity: 1 Mod    Dontray Haberland D Causey 11/24/2022, 2:08 PM

## 2022-11-24 NOTE — Progress Notes (Signed)
[address]       [place] [address]             [phone]      3 Days Post-Op  Procedure(s) (LRB): CORONARY ARTERY BYPASS GRAFTING (CABG) X 3, USING LEFT INTERNAL MAMMARY ARTERY AND ENDOSCOPICALLY HARVESTED RIGHT GREATER SAPHENOUS VEIN. (N/A) TRANSESOPHAGEAL ECHOCARDIOGRAM (TEE) (N/A)  Total Length of Stay:  LOS: 5 days   SUBJECTIVE: Feels well, not eating well Walked ok this am Vitals:   11/24/22 0755 11/24/22 0800  BP:  (!) 146/81  Pulse:  99  Resp:  (!) 26  Temp: 98.3 F (36.8 C)   SpO2:  97%    Intake/Output      11/24 0701 11/25 0700 11/25 0701 11/26 0700   I.V. (mL/kg) 5.9 (0.1)    IV Piggyback     Total Intake(mL/kg) 5.9 (0.1)    Urine (mL/kg/hr) 2850 (1.4)    Stool 0    Chest Tube 100    Total Output 2950    Net -2944.1         Urine Occurrence 1 x    Stool Occurrence 1 x        sodium chloride Stopped (11/22/22 1010)   sodium chloride     sodium chloride Stopped (11/22/22 1509)   insulin Stopped (11/22/22 1432)   lactated ringers     lactated ringers     lactated ringers Stopped (11/22/22 1509)   nitroGLYCERIN Stopped (11/21/22 1515)   norepinephrine (LEVOPHED) Adult infusion Stopped (11/22/22 0756)   phenylephrine (NEO-SYNEPHRINE) Adult infusion Stopped (11/21/22 2307)    CBC    Component Value Date/Time   WBC 10.3 11/24/2022 0441   RBC 2.74 (L) 11/24/2022 0441   HGB 7.7 (L) 11/24/2022 0441   HCT 25.2 (L) 11/24/2022 0441   PLT 155 11/24/2022 0441   MCV 92.0 11/24/2022 0441   MCH 28.1 11/24/2022 0441   MCHC 30.6 11/24/2022 0441   RDW 14.6 11/24/2022 0441   LYMPHSABS 2.2 11/20/2022 0352   MONOABS 0.6 11/20/2022 0352   EOSABS 0.2 11/20/2022 0352   BASOSABS 0.1 11/20/2022 0352   CMP     Component Value Date/Time   NA 139 11/24/2022 0441   K 3.4 (L) 11/24/2022 0441   CL 103 11/24/2022 0441   CO2 24 11/24/2022 0441   GLUCOSE 92 11/24/2022 0441   BUN 12 11/24/2022 0441   CREATININE 0.77 11/24/2022 0441    CALCIUM 8.2 (L) 11/24/2022 0441   PROT 7.0 11/19/2022 1335   ALBUMIN 4.0 11/19/2022 1335   AST 17 11/19/2022 1335   ALT 13 11/19/2022 1335   ALKPHOS 47 11/19/2022 1335   BILITOT 0.4 11/19/2022 1335   GFRNONAA >60 11/24/2022 0441   GFRAA >60 10/17/2017 1329   ABG    Component Value Date/Time   PHART 7.344 (L) 11/21/2022 1950   PCO2ART 44.4 11/21/2022 1950   PO2ART 121 (H) 11/21/2022 1950   HCO3 24.2 11/21/2022 1950   TCO2 26 11/21/2022 1950   ACIDBASEDEF 2.0 11/21/2022 1950   O2SAT 98 11/21/2022 1950   CBG (last 3)  Recent Labs    11/23/22 1556 11/23/22 2136 11/24/22 0650  GLUCAP 195* 138* 115*  EXAM Lungs: decreased at bases Card: RR Wounds: dressing dry Ext: some edema   ASSESSMENT: POD #3 sp CABG Hemodynamically ok DC Pws and introducer from neck Add nutritional supplement Lasix/Potassium  Ok to go to floor   Coralie Common, MD 11/24/2022

## 2022-11-24 NOTE — Evaluation (Signed)
Physical Therapy Evaluation Patient Details Name: Mercedes Adams MRN: [id number] DOB: 06/04/1956 Today's Date: 11/24/2022  History of Present Illness  Pt is 66 year old presented to [facility] on  11/19/22 with chest pain. Pt underwent CABG x 3 on 11/21/22. PMH - CAD, recent NSTEMI, recent GI bleed, anxiety, arthritis, DM, HTN,  Clinical Impression  Pt presents to PT needing assist for all mobility after CABG due to problems listed below. For pt to return home she will need to be able to do everything for herself at home as well as manage husbands care (husband doesn't require physical assistance). Pt worried about having the activity tolerance to do all that and would like to go to [facility] to improve mobility and activity tolerance so she will be able to return home at a higher level in order to manage all household activities. Agree she could benefit from SNF if she is ready for DC in the next several days.      Recommendations for follow up therapy are one component of a multi-disciplinary discharge planning process, led by the attending physician.  Recommendations may be updated based on patient status, additional functional criteria and insurance authorization.  Follow Up Recommendations Skilled nursing-short term rehab (<3 hours/day) Can patient physically be transported by private vehicle: Yes    Assistance Recommended at Discharge Intermittent Supervision/Assistance  Patient can return home with the following  A little help with walking and/or transfers;A little help with bathing/dressing/bathroom;Assistance with cooking/housework;Assist for transportation;Help with stairs or ramp for entrance    Equipment Recommendations Rollator (4 wheels)  Recommendations for Other Services       Functional Status Assessment Patient has had a recent decline in their functional status and demonstrates the ability to make significant improvements in function in a reasonable and predictable amount of time.      Precautions / Restrictions Precautions Precautions: Sternal;Fall Precaution Booklet Issued: Yes (comment)      Mobility  Bed Mobility Overal bed mobility: Needs Assistance Bed Mobility: Rolling, Sidelying to Sit Rolling: Min assist Sidelying to sit: Min assist       General bed mobility comments: Assist to bring shoulders over while rolling. Assist to bring legs off of bed, elevate trunk into sitting.    Transfers Overall transfer level: Needs assistance Equipment used: Rollator (4 wheels) Transfers: Sit to/from Stand Sit to Stand: Min assist           General transfer comment: Assist to stabilize as rising.    Ambulation/Gait Ambulation/Gait assistance: Min guard Gait Distance (Feet): 350 Feet Assistive device: Rollator (4 wheels) Gait Pattern/deviations: Step-through pattern, Decreased stride length Gait velocity: decr Gait velocity interpretation: <1.31 ft/sec, indicative of household ambulator   General Gait Details: Assist for safety and lines.  Stairs            Wheelchair Mobility    Modified Rankin (Stroke Patients Only)       Balance Overall balance assessment: Needs assistance Sitting-balance support: No upper extremity supported, Feet supported Sitting balance-Leahy Scale: Good     Standing balance support: No upper extremity supported, During functional activity Standing balance-Leahy Scale: Fair                               Pertinent Vitals/Pain Pain Assessment Pain Assessment: No/denies pain    Home Living Family/patient expects to be discharged to:: Private residence Living Arrangements: Spouse/significant other (spouse unable to assist in any way. Pt was  his caregiver - did not provide physical assist to him) Available Help at Discharge: Other (Comment) (none) Type of Home: Mobile home Home Access: Ramped entrance       Home Layout: One level Home Equipment: Toilet riser;Grab bars - tub/shower;Cane -  single Barista (2 wheels);Shower seat;Standard Environmental consultant Additional Comments: Pt's hus    Prior Function Prior Level of Function : Independent/Modified Independent;Driving                     Hand Dominance   Dominant Hand: Right    Extremity/Trunk Assessment   Upper Extremity Assessment Upper Extremity Assessment: Defer to OT evaluation    Lower Extremity Assessment Lower Extremity Assessment: Generalized weakness       Communication   Communication: No difficulties  Cognition Arousal/Alertness: Awake/alert Behavior During Therapy: WFL for tasks assessed/performed Overall Cognitive Status: Within Functional Limits for tasks assessed                                          General Comments General comments (skin integrity, edema, etc.): VSS on RA. SpO2 >93% with all mobility    Exercises     Assessment/Plan    PT Assessment Patient needs continued PT services  PT Problem List Decreased strength;Decreased mobility;Decreased knowledge of precautions;Decreased knowledge of use of DME;Decreased balance       PT Treatment Interventions DME instruction;Gait training;Functional mobility training;Therapeutic activities;Therapeutic exercise;Patient/family education;Balance training    PT Goals (Current goals can be found in the Care Plan section)  Acute Rehab PT Goals Patient Stated Goal: go to rehab PT Goal Formulation: With patient Time For Goal Achievement: 12/08/22 Potential to Achieve Goals: Good    Frequency Min 2X/week     Co-evaluation               AM-PAC PT "6 Clicks" Mobility  Outcome Measure Help needed turning from your back to your side while in a flat bed without using bedrails?: A Little Help needed moving from lying on your back to sitting on the side of a flat bed without using bedrails?: A Little Help needed moving to and from a bed to a chair (including a wheelchair)?: A Little Help needed standing up  from a chair using your arms (e.g., wheelchair or bedside chair)?: A Little Help needed to walk in hospital room?: A Little Help needed climbing 3-5 steps with a railing? : A Lot 6 Click Score: 17    End of Session   Activity Tolerance: Patient tolerated treatment well Patient left: in bed;with family/visitor present;Other (comment) (with OT) Nurse Communication: Mobility status PT Visit Diagnosis: Other abnormalities of gait and mobility (R26.89)    Time: 4536-4680 PT Time Calculation (min) (ACUTE ONLY): 13 min   Charges:   PT Evaluation $PT Eval Moderate Complexity: [place] 11/24/2022, 1:57 PM

## 2022-11-24 NOTE — Progress Notes (Signed)
Transferred- in from 2H by bed awake and alert. 

## 2022-11-24 NOTE — Plan of Care (Signed)
Problem: Education: Goal: Understanding of cardiac disease, CV risk reduction, and recovery process will improve Outcome: Progressing Goal: Individualized Educational Video(s) Outcome: Progressing   Problem: Activity: Goal: Ability to tolerate increased activity will improve Outcome: Progressing   Problem: Cardiac: Goal: Ability to achieve and maintain adequate cardiovascular perfusion will improve Outcome: Progressing   Problem: Health Behavior/Discharge Planning: Goal: Ability to safely manage health-related needs after discharge will improve Outcome: Progressing   Problem: Education: Goal: Understanding of CV disease, CV risk reduction, and recovery process will improve Outcome: Progressing Goal: Individualized Educational Video(s) Outcome: Progressing   Problem: Activity: Goal: Ability to return to baseline activity level will improve Outcome: Progressing   Problem: Cardiovascular: Goal: Ability to achieve and maintain adequate cardiovascular perfusion will improve Outcome: Progressing Goal: Vascular access site(s) Level 0-1 will be maintained Outcome: Progressing   Problem: Health Behavior/Discharge Planning: Goal: Ability to safely manage health-related needs after discharge will improve Outcome: Progressing   Problem: Education: Goal: Ability to describe self-care measures that may prevent or decrease complications (Diabetes Survival Skills Education) will improve Outcome: Progressing Goal: Individualized Educational Video(s) Outcome: Progressing   Problem: Coping: Goal: Ability to adjust to condition or change in health will improve Outcome: Progressing   Problem: Fluid Volume: Goal: Ability to maintain a balanced intake and output will improve Outcome: Progressing   Problem: Health Behavior/Discharge Planning: Goal: Ability to identify and utilize available resources and services will improve Outcome: Progressing Goal: Ability to manage health-related  needs will improve Outcome: Progressing   Problem: Metabolic: Goal: Ability to maintain appropriate glucose levels will improve Outcome: Progressing   Problem: Nutritional: Goal: Maintenance of adequate nutrition will improve Outcome: Progressing Goal: Progress toward achieving an optimal weight will improve Outcome: Progressing   Problem: Skin Integrity: Goal: Risk for impaired skin integrity will decrease Outcome: Progressing   Problem: Tissue Perfusion: Goal: Adequacy of tissue perfusion will improve Outcome: Progressing   Problem: Education: Goal: Knowledge of General Education information will improve Description: Including pain rating scale, medication(s)/side effects and non-pharmacologic comfort measures Outcome: Progressing   Problem: Health Behavior/Discharge Planning: Goal: Ability to manage health-related needs will improve Outcome: Progressing   Problem: Clinical Measurements: Goal: Ability to maintain clinical measurements within normal limits will improve Outcome: Progressing Goal: Will remain free from infection Outcome: Progressing Goal: Diagnostic test results will improve Outcome: Progressing Goal: Respiratory complications will improve Outcome: Progressing Goal: Cardiovascular complication will be avoided Outcome: Progressing   Problem: Activity: Goal: Risk for activity intolerance will decrease Outcome: Progressing   Problem: Nutrition: Goal: Adequate nutrition will be maintained Outcome: Progressing   Problem: Coping: Goal: Level of anxiety will decrease Outcome: Progressing   Problem: Elimination: Goal: Will not experience complications related to bowel motility Outcome: Progressing Goal: Will not experience complications related to urinary retention Outcome: Progressing   Problem: Pain Managment: Goal: General experience of comfort will improve Outcome: Progressing   Problem: Safety: Goal: Ability to remain free from injury will  improve Outcome: Progressing   Problem: Education: Goal: Will demonstrate proper wound care and an understanding of methods to prevent future damage Outcome: Progressing Goal: Knowledge of disease or condition will improve Outcome: Progressing Goal: Knowledge of the prescribed therapeutic regimen will improve Outcome: Progressing Goal: Individualized Educational Video(s) Outcome: Progressing   Problem: Cardiac: Goal: Will achieve and/or maintain hemodynamic stability Outcome: Progressing   Problem: Activity: Goal: Risk for activity intolerance will decrease Outcome: Progressing   Problem: Clinical Measurements: Goal: Postoperative complications will be avoided or minimized Outcome: Progressing  Problem: Respiratory: Goal: Respiratory status will improve Outcome: Progressing   

## 2022-11-25 LAB — BASIC METABOLIC PANEL
Anion gap: 11 (ref 5–15)
Anion gap: 6 (ref 5–15)
BUN: 12 mg/dL (ref 8–23)
BUN: 11 mg/dL (ref 8–23)
CO2: 22 mmol/L (ref 22–32)
CO2: 26 mmol/L (ref 22–32)
Calcium: 5.5 mg/dL — CL (ref 8.9–10.3)
Calcium: 8.2 mg/dL — ABNORMAL LOW (ref 8.9–10.3)
Chloride: 108 mmol/L (ref 98–111)
Chloride: 108 mmol/L (ref 98–111)
Creatinine, Ser: 0.75 mg/dL (ref 0.44–1.00)
Creatinine, Ser: 0.78 mg/dL (ref 0.44–1.00)
GFR, Estimated: >60 mL/min (ref >60)
GFR, Estimated: >60 mL/min (ref >60)
Glucose, Bld: 126 mg/dL — ABNORMAL HIGH (ref 70–99)
Glucose, Bld: 119 mg/dL — ABNORMAL HIGH (ref 70–99)
Potassium: 6 mmol/L — ABNORMAL HIGH (ref 3.5–5.1)
Potassium: 4 mmol/L (ref 3.5–5.1)
Sodium: 141 mmol/L (ref 135–145)
Sodium: 140 mmol/L (ref 135–145)

## 2022-11-25 LAB — CBC
HCT: 27.1 % — ABNORMAL LOW (ref 36.0–46.0)
Hemoglobin: 8.6 g/dL — ABNORMAL LOW (ref 12.0–15.0)
MCH: 28.6 pg (ref 26.0–34.0)
MCHC: 31.7 g/dL (ref 30.0–36.0)
MCV: 90 fL (ref 80.0–100.0)
Platelets: 148 10*3/uL — ABNORMAL LOW (ref 150–400)
RBC: 3.01 MIL/uL — ABNORMAL LOW (ref 3.87–5.11)
RDW: 14.7 % (ref 11.5–15.5)
WBC: 10.4 10*3/uL (ref 4.0–10.5)
nRBC: 0.2 % (ref 0.0–0.2)

## 2022-11-25 LAB — TSH: TSH: 1.298 u[IU]/mL (ref 0.350–4.500)

## 2022-11-25 LAB — GLUCOSE, CAPILLARY
Glucose-Capillary: 113 mg/dL — ABNORMAL HIGH (ref 70–99)
Glucose-Capillary: 166 mg/dL — ABNORMAL HIGH (ref 70–99)
Glucose-Capillary: 148 mg/dL — ABNORMAL HIGH (ref 70–99)
Glucose-Capillary: 95 mg/dL (ref 70–99)

## 2022-11-25 MED ORDER — AMIODARONE HCL 200 MG PO TABS: 400.0000 mg | ORAL_TABLET | Freq: Every day | ORAL | Status: DC

## 2022-11-25 MED ORDER — METOPROLOL TARTRATE 50 MG PO TABS
50.0000 mg | ORAL_TABLET | Freq: Two times a day (BID) | ORAL | Status: DC
  Administered 2022-11-25 – 2022-11-27 (×5): 50 mg via ORAL
  Filled 2022-11-25 (×5): qty 1

## 2022-11-25 MED ORDER — METFORMIN HCL 500 MG PO TABS
1000.0000 mg | ORAL_TABLET | Freq: Two times a day (BID) | ORAL | Status: DC
  Administered 2022-11-25 – 2022-11-27 (×4): 1000 mg via ORAL
  Filled 2022-11-25 (×4): qty 2

## 2022-11-25 MED ORDER — EMPAGLIFLOZIN 25 MG PO TABS
25.0000 mg | ORAL_TABLET | Freq: Every day | ORAL | Status: DC
  Administered 2022-11-25 – 2022-11-27 (×3): 25 mg via ORAL
  Filled 2022-11-25 (×3): qty 1

## 2022-11-25 MED ORDER — AMIODARONE LOAD VIA INFUSION
150.0000 mg | Freq: Once | INTRAVENOUS | Status: DC
  Filled 2022-11-25: qty 83.34

## 2022-11-25 MED ORDER — AMIODARONE HCL 200 MG PO TABS
400.0000 mg | ORAL_TABLET | Freq: Two times a day (BID) | ORAL | Status: DC
  Administered 2022-11-25 – 2022-11-26 (×2): 400 mg via ORAL
  Filled 2022-11-25 (×2): qty 2

## 2022-11-25 MED ORDER — AMIODARONE IV BOLUS ONLY 150 MG/100ML
150.0000 mg | Freq: Once | INTRAVENOUS | Status: AC
  Administered 2022-11-25: 150 mg via INTRAVENOUS
  Filled 2022-11-25: qty 100

## 2022-11-25 NOTE — Progress Notes (Signed)
[address]       [place] [address]             [phone]      4 Days Post-Op  Procedure(s) (LRB): CORONARY ARTERY BYPASS GRAFTING (CABG) X 3, USING LEFT INTERNAL MAMMARY ARTERY AND ENDOSCOPICALLY HARVESTED RIGHT GREATER SAPHENOUS VEIN. (N/A) TRANSESOPHAGEAL ECHOCARDIOGRAM (TEE) (N/A)   Total Length of Stay:  LOS: 6 days   SUBJECTIVE: Slept well Had some hypoxia overnight and rapid heart rate to 140 and was concerned   Vitals:   11/25/22 0503 11/25/22 0734  BP: 137/83 135/73  Pulse: 93 95  Resp: (!) 25 (!) 25  Temp: 97.9 F (36.6 C) 98.1 F (36.7 C)  SpO2: 96% 95%    Intake/Output      11/25 0701 11/26 0700 11/26 0701 11/27 0700   P.O. 834    I.V. (mL/kg)     Total Intake(mL/kg) 834 (10.1)    Urine (mL/kg/hr)     Stool     Chest Tube     Total Output     Net +834         Urine Occurrence 2 x    Stool Occurrence 1 x        sodium chloride Stopped (11/22/22 1010)   sodium chloride     sodium chloride Stopped (11/22/22 1509)   insulin Stopped (11/22/22 1432)   lactated ringers     lactated ringers     lactated ringers Stopped (11/22/22 1509)   nitroGLYCERIN Stopped (11/21/22 1515)   norepinephrine (LEVOPHED) Adult infusion Stopped (11/22/22 0756)   phenylephrine (NEO-SYNEPHRINE) Adult infusion Stopped (11/21/22 2307)    CBC    Component Value Date/Time   WBC 10.4 11/25/2022 0025   RBC 3.01 (L) 11/25/2022 0025   HGB 8.6 (L) 11/25/2022 0025   HCT 27.1 (L) 11/25/2022 0025   PLT 148 (L) 11/25/2022 0025   MCV 90.0 11/25/2022 0025   MCH 28.6 11/25/2022 0025   MCHC 31.7 11/25/2022 0025   RDW 14.7 11/25/2022 0025   LYMPHSABS 2.2 11/20/2022 0352   MONOABS 0.6 11/20/2022 0352   EOSABS 0.2 11/20/2022 0352   BASOSABS 0.1 11/20/2022 0352   CMP     Component Value Date/Time   NA 140 11/25/2022 0139   K 4.0 11/25/2022 0139   CL 108 11/25/2022 0139   CO2 26 11/25/2022 0139   GLUCOSE 119 (H) 11/25/2022 0139   BUN 11  11/25/2022 0139   CREATININE 0.78 11/25/2022 0139   CALCIUM 8.2 (L) 11/25/2022 0139   PROT 7.0 11/19/2022 1335   ALBUMIN 4.0 11/19/2022 1335   AST 17 11/19/2022 1335   ALT 13 11/19/2022 1335   ALKPHOS 47 11/19/2022 1335   BILITOT 0.4 11/19/2022 1335   GFRNONAA >60 11/25/2022 0139   GFRAA >60 10/17/2017 1329   ABG    Component Value Date/Time   PHART 7.344 (L) 11/21/2022 1950   PCO2ART 44.4 11/21/2022 1950   PO2ART 121 (H) 11/21/2022 1950   HCO3 24.2 11/21/2022 1950   TCO2 26 11/21/2022 1950   ACIDBASEDEF 2.0 11/21/2022 1950   O2SAT 98 11/21/2022 1950   CBG (last 3)  Recent Labs    11/24/22 1555 11/24/22 2110 11/25/22 0622  GLUCAP 201* 178* 113*  EXAM Lungs: overall decreased at bases with some crackles Card: rr Incision: clean Ext: no edema   ASSESSMENT: POD #4 SP CABG Will increase BB to '25mg'$  bid Restart home diabetic meds Continue diuretics and encourage ambulation  to improve hypoxia and atelectasis    Coralie Common, MD 11/25/2022

## 2022-11-25 NOTE — Progress Notes (Signed)
[address]       [place], [address]             [phone]     4 Days Post-Op Procedure(s) (LRB): CORONARY ARTERY BYPASS GRAFTING (CABG) X 3, USING LEFT INTERNAL MAMMARY ARTERY AND ENDOSCOPICALLY HARVESTED RIGHT GREATER SAPHENOUS VEIN. (N/A) TRANSESOPHAGEAL ECHOCARDIOGRAM (TEE) (N/A) Subjective: Continues to feel stronger  Objective: Vital signs in last 24 hours: Temp:  [97.7 F (36.5 C)-98.3 F (36.8 C)] 98.1 F (36.7 C) (11/26 0734) Pulse Rate:  [89-100] 95 (11/26 0734) Cardiac Rhythm: Normal sinus rhythm (11/26 0752) Resp:  [21-28] 25 (11/26 0734) BP: (110-152)/(56-91) 135/73 (11/26 0734) SpO2:  [92 %-96 %] 95 % (11/26 0734) Weight:  [82.5 kg] 82.5 kg (11/26 0503)  Hemodynamic parameters for last 24 hours:    Intake/Output from previous day: 11/25 0701 - 11/26 0700 In: 834 [P.O.:834] Out: -  Intake/Output this shift: No intake/output data recorded.  General appearance: alert, cooperative, and no distress Heart: regular rate and rhythm Lungs: clear to auscultation bilaterally Abdomen: Benign exam Extremities: Trace edema Wound: Incisions healing well without evidence of infection  Lab Results: Recent Labs    11/24/22 0441 11/25/22 0025  WBC 10.3 10.4  HGB 7.7* 8.6*  HCT 25.2* 27.1*  PLT 155 148*   BMET:  Recent Labs    11/25/22 0025 11/25/22 0139  NA 141 140  K 6.0* 4.0  CL 108 108  CO2 22 26  GLUCOSE 126* 119*  BUN 12 11  CREATININE 0.75 0.78  CALCIUM 5.5* 8.2*    PT/INR: No results for input(s): "LABPROT", "INR" in the last 72 hours. ABG    Component Value Date/Time   PHART 7.344 (L) 11/21/2022 1950   HCO3 24.2 11/21/2022 1950   TCO2 26 11/21/2022 1950   ACIDBASEDEF 2.0 11/21/2022 1950   O2SAT 98 11/21/2022 1950   CBG (last 3)  Recent Labs    11/24/22 1555 11/24/22 2110 11/25/22 0622  GLUCAP 201* 178* 113*    Meds Scheduled Meds:  acetaminophen  1,000 mg Oral Q6H   Or   acetaminophen (TYLENOL) oral  liquid 160 mg/5 mL  1,000 mg Per Tube Q6H   aspirin EC  325 mg Oral Daily   Or   aspirin  324 mg Per Tube Daily   atorvastatin  80 mg Oral QHS   bisacodyl  10 mg Oral Daily   Or   bisacodyl  10 mg Rectal Daily   Chlorhexidine Gluconate Cloth  6 each Topical Daily   docusate sodium  200 mg Oral Daily   empagliflozin  25 mg Oral Daily   enoxaparin (LOVENOX) injection  40 mg Subcutaneous QHS   Fe Fum-Vit C-Vit B12-FA  1 capsule Oral QPC breakfast   feeding supplement (GLUCERNA SHAKE)  237 mL Oral TID BM   fenofibrate  160 mg Oral Daily   furosemide  40 mg Oral Daily   insulin aspart  0-24 Units Subcutaneous TID AC & HS   insulin detemir  5 Units Subcutaneous Daily   levothyroxine  100 mcg Oral Q0600   metFORMIN  1,000 mg Oral BID WC   metoCLOPramide (REGLAN) injection  10 mg Intravenous Q6H   metoprolol tartrate  50 mg Oral BID   mupirocin ointment   Nasal BID   pantoprazole (PROTONIX) IV  40 mg Intravenous Q12H   potassium chloride  20 mEq Oral Daily   sodium chloride flush  3 mL Intravenous Q12H   Continuous Infusions:  sodium chloride Stopped (11/22/22 1010)   sodium chloride     sodium chloride Stopped (11/22/22 1509)   insulin Stopped (11/22/22 1432)   lactated ringers     lactated ringers     lactated ringers Stopped (11/22/22 1509)   nitroGLYCERIN Stopped (11/21/22 1515)   norepinephrine (LEVOPHED) Adult infusion Stopped (11/22/22 0756)   phenylephrine (NEO-SYNEPHRINE) Adult infusion Stopped (11/21/22 2307)   PRN Meds:.sodium chloride, dextrose, lactated ringers, midazolam, morphine injection, ondansetron (ZOFRAN) IV, mouth rinse, sodium chloride flush, traMADol  Xrays DG Chest Port 1 View  Result Date: 11/24/2022 CLINICAL DATA:  Status post coronary bypass graft. EXAM: PORTABLE CHEST 1 VIEW COMPARISON:  November 23, 2022. FINDINGS: Stable cardiomegaly. Status post coronary bypass graft. Right internal jugular venous sheath is unchanged. No definite pneumothorax is  noted status post left-sided chest tube removal. Minimal right basilar subsegmental atelectasis is noted with small right pleural effusion. Bony thorax is unremarkable. IMPRESSION: No definite pneumothorax is noted status post left-sided chest tube removal. Electronically Signed   By: Marijo Conception M.D.   On: 11/24/2022 08:37    Assessment/Plan: S/P Procedure(s) (LRB): CORONARY ARTERY BYPASS GRAFTING (CABG) X 3, USING LEFT INTERNAL MAMMARY ARTERY AND ENDOSCOPICALLY HARVESTED RIGHT GREATER SAPHENOUS VEIN. (N/A) TRANSESOPHAGEAL ECHOCARDIOGRAM (TEE) (N/A) POD #5 s/p CABG  1 afebrile, stable vital signs, SBP 110-152 range,  atrial fibrillation with RVR at times, currently in sinus rhythm, will start amiodarone orally since only has peripheral IV will need to determine if requires [facility] Rx 2 oxygen saturations are okay on 2 L 3 weight trending lower, approximately 3 kg greater than preop, voiding urine output not recorded, on oral Lasix 4 blood sugar control is fair-metformin and Jardiance have been resumed 5 normal renal function 6 expected acute blood loss anemia with improving trend with re-equilibration of volume status 7 routine pulmonary hygiene and cardiac rehab     LOS: 6 days    Mercedes Adams 11/25/2022

## 2022-11-25 NOTE — Discharge Summary (Addendum)
[address]       [place] [address]             [phone]    Physician Discharge Summary  Patient ID: Mercedes Adams MRN: [id number] DOB/AGE: April 26, 1956 66 y.o.  Admit date: 11/19/2022 Discharge date: 11/27/2022  Admission Diagnoses:  Patient Active Problem List   Diagnosis Date Noted   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   Unstable angina ([place]) 11/19/2022   Hypertriglyceridemia 11/03/2022   Acute GI bleeding 11/02/2022   Duodenal ulcer 11/02/2022   Acute heart failure with preserved ejection fraction (HFpEF) ([place]) 11/02/2022   Acute blood loss anemia 11/02/2022   Class 1 obesity due to excess calories with serious comorbidity and body mass index (BMI) of 32.0 to 32.9 in adult 11/02/2022   NSTEMI (non-ST elevated myocardial infarction) ([place]) 11/01/2022   DM (diabetes mellitus) ([place]) 11/01/2022   Essential hypertension 11/01/2022   Mixed hyperlipidemia 11/01/2022   Coronary artery disease involving native coronary artery of native heart without angina pectoris 11/01/2022     Discharge Diagnoses:  Patient Active Problem List   Diagnosis Date Noted   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   Unstable angina ([place]) 11/19/2022   Hypertriglyceridemia 11/03/2022   Acute GI bleeding 11/02/2022   Duodenal ulcer 11/02/2022   Acute heart failure with preserved ejection fraction (HFpEF) ([place]) 11/02/2022   Acute blood loss anemia 11/02/2022   Class 1 obesity due to excess calories with serious comorbidity and body mass index (BMI) of 32.0 to 32.9 in adult 11/02/2022   NSTEMI (non-ST elevated myocardial infarction) ([place]) 11/01/2022   DM (diabetes mellitus) ([place]) 11/01/2022   Essential hypertension 11/01/2022   Mixed hyperlipidemia 11/01/2022   Coronary artery disease involving native coronary artery of native heart without angina pectoris 11/01/2022     Discharged Condition: good  History of present illness:  Patient was evaluated for coronary bypass grafting by Dr. Roxan Hockey  after she sustained a non-STEMI during admission for upper GI bleeding from a duodenal ulcer with hemoglobin of 6.5.  The ulcer was treated endoscopically and she was transfused 2 units of packed cells.  Surgery was scheduled after period of recovery however she presented today with unstable angina and is now admitted.  Her hemoglobin is now 10.7.  Her angina is controlled on IV heparin.  She will be scheduled for multivessel CABG on Wednesday, November 22.   Dr. Darcey Nora discussed the procedure of CABG in detail with the patient and her sister and she understands the benefits and risks and agrees to proceed.  She was found to be stable to proceed on 11/21/2022.  Hospital course: Patient was medically stabilized and 11/21/2022 she was taken the operating room.  She underwent the following procedure: Coronary artery bypass grafting times x 3.  LIMA to diagonal, saphenous vein graft to OM, saphenous vein graft to PDA were placed.  Tolerated procedure well was taken to the surgical intensive care unit in stable condition.  The patient was extubated the evening of surgery.  She was weaned off Levophed and Milrinone as hemodynamics allowed.  The patient was started on diuretics for volume overloaded state.  Her chest tubes and arterial lines were removed without difficulty.   She did go into postoperative atrial fibrillation and has been started on oral amiodarone with subsequent chemical cardioversion to sinus rhythm.  She is tolerating routine ambulation or cardiac rehab protocols.  Incisions are healing well without evidence of infection.  She does have an  expected acute blood loss anemia which is stable.  She has been started on an iron supplement.  She had some expected postoperative volume overload but has responded well to diuretics.  She is now at her baseline weight.  She has nobody who can stay with her and will require short-term skilled nursing  which has been arranged at the [facility] facility.  At the time  of discharge the patient is felt to be quite stable.    Consults: cardiology  Significant Diagnostic Studies:  DG Chest Port 1 View  Result Date: 11/24/2022 CLINICAL DATA:  Status post coronary bypass graft. EXAM: PORTABLE CHEST 1 VIEW COMPARISON:  November 23, 2022. FINDINGS: Stable cardiomegaly. Status post coronary bypass graft. Right internal jugular venous sheath is unchanged. No definite pneumothorax is noted status post left-sided chest tube removal. Minimal right basilar subsegmental atelectasis is noted with small right pleural effusion. Bony thorax is unremarkable. IMPRESSION: No definite pneumothorax is noted status post left-sided chest tube removal. Electronically Signed   By: Marijo Conception M.D.   On: 11/24/2022 08:37   DG Chest Port 1 View  Result Date: 11/23/2022 CLINICAL DATA:  Status post CABG EXAM: PORTABLE CHEST 1 VIEW COMPARISON:  11/22/2022 and prior studies FINDINGS: Cardiomegaly and CABG changes again noted. A Swan-Ganz catheter has been removed with RIGHT IJ sheath remaining. Mediastinal/LEFT thoracostomy tubes are again noted. Decreased pulmonary vascular congestion and bibasilar opacities/atelectasis noted. There is no evidence of pneumothorax. IMPRESSION: 1. Decreased pulmonary vascular congestion and bibasilar opacities/atelectasis. No pneumothorax. 2. Swan-Ganz catheter removal. Electronically Signed   By: Margarette Canada M.D.   On: 11/23/2022 08:35   DG Chest Port 1 View  Result Date: 11/22/2022 CLINICAL DATA:  Chest tube.  CABG. EXAM: PORTABLE CHEST 1 VIEW COMPARISON:  11/21/2022 FINDINGS: Low volume film. Left chest tube remains in place without evidence for pneumothorax. Right IJ pulmonary artery catheter tip is in the right main pulmonary artery. Similar bibasilar collapse/consolidation with tiny bilateral pleural effusions. Midline mediastinal/pericardial drain again noted. Telemetry leads overlie the chest. IMPRESSION: No substantial interval change. Left chest  tube remains in place without evidence for pneumothorax. Similar to mildly increased basilar collapse/consolidation with tiny effusions. Electronically Signed   By: Misty Stanley M.D.   On: 11/22/2022 10:23   ECHO INTRAOPERATIVE TEE  Result Date: 11/22/2022  *INTRAOPERATIVE TRANSESOPHAGEAL REPORT *  Patient Name:   YEVA BISSETTE Date of Exam: 11/21/2022 Medical Rec #:  [id number]    Height:       64.0 in Accession #:    [id number]   Weight:       174.2 lb Date of Birth:  03-05-1956    BSA:          1.84 m Patient Age:    67 years     BP:           140/76 mmHg Patient Gender: F            HR:           76 bpm. Exam Location:  Inpatient Transesophogeal exam was perform intraoperatively during surgical procedure. Patient was closely monitored under general anesthesia during the entirety of examination. Indications:     I25.110 Atherosclerotic heart disease of native coronary artery                  with unstable angina pectoris Performing Phys: [id number] PETER VANTRIGT Diagnosing Phys: Albertha Ghee MD Complications: No known complications during this procedure. POST-OP IMPRESSIONS Overall, there were  no significant changes from pre-bypass. PRE-OP FINDINGS  Left Ventricle: The left ventricle has mildly reduced systolic function, with an ejection fraction of 45-50%. The cavity size was normal. There is mildly increased left ventricular wall thickness. There is mild concentric left ventricular hypertrophy. Right Ventricle: The right ventricle has normal systolic function. The cavity was normal. There is no increase in right ventricular wall thickness. Left Atrium: Left atrial size was normal in size. No left atrial/left atrial appendage thrombus was detected. Right Atrium: Right atrial size was normal in size. Interatrial Septum: No atrial level shunt detected by color flow Doppler. Pericardium: There is no evidence of pericardial effusion. Mitral Valve: The mitral valve is normal in structure. Mitral valve regurgitation  is trivial by color flow Doppler. Tricuspid Valve: The tricuspid valve was normal in structure. Tricuspid valve regurgitation is trivial by color flow Doppler. Aortic Valve: The aortic valve is tricuspid Aortic valve regurgitation was not visualized by color flow Doppler. There is no stenosis of the aortic valve. Pulmonic Valve: The pulmonic valve was normal in structure, with normal. Pulmonic valve regurgitation is not visualized by color flow Doppler. Aorta: The aortic root, ascending aorta and aortic arch are normal in size and structure.  Albertha Ghee MD Electronically signed by Albertha Ghee MD Signature Date/Time: 11/22/2022/7:57:51 AM    Final    DG Chest Port 1 View  Result Date: 11/21/2022 CLINICAL DATA:  Status post coronary bypass graft. EXAM: PORTABLE CHEST 1 VIEW COMPARISON:  November 19, 2022. FINDINGS: Endotracheal tube is in grossly good position. Right internal jugular Swan-Ganz catheter is noted with tip in expected position main pulmonary artery. Left-sided chest tube is noted without pneumothorax. Mild bibasilar subsegmental atelectasis is noted. IMPRESSION: Left-sided chest tube is noted without pneumothorax. Mild bibasilar subsegmental atelectasis. Endotracheal tube is in grossly good position. Electronically Signed   By: Marijo Conception M.D.   On: 11/21/2022 14:48   DG Chest 2 View  Result Date: 11/19/2022 CLINICAL DATA:  Provided history: Chest pain. EXAM: CHEST - 2 VIEW COMPARISON:  Prior chest radiographs 11/01/2022 and earlier. FINDINGS: Heart size within normal limits. No appreciable airspace consolidation or pulmonary edema. No evidence of pleural effusion or pneumothorax. Degenerative changes of the spine. Surgical clip(s) within the right upper quadrant of the abdomen. IMPRESSION: No evidence of active cardiopulmonary disease. Electronically Signed   By: Kellie Simmering D.O.   On: 11/19/2022 11:44   VAS US DOPPLER PRE CABG  Result Date: 11/05/2022 PREOPERATIVE VASCULAR  EVALUATION Patient Name:  DEBORA STOCKDALE  Date of Exam:   11/05/2022 Medical Rec #: [id number]     Accession #:    [id number] Date of Birth: 01-20-1956     Patient Gender: F Patient Age:   51 years Exam Location:  [facility] Procedure:      VAS US DOPPLER PRE CABG Referring Phys: Remo Lipps HENDRICKSON --------------------------------------------------------------------------------  Indications:      Pre-CABG. Risk Factors:     Hypertension, hyperlipidemia, Diabetes, prior MI, coronary                   artery disease. Comparison Study: No prior studies. Performing Technologist: Darlin Coco RDMS, RVT Supporting Technologist: Sharion Dove RVS  Examination Guidelines: A complete evaluation includes B-mode imaging, spectral Doppler, color Doppler, and power Doppler as needed of all accessible portions of each vessel. Bilateral testing is considered an integral part of a complete examination. Limited examinations for reoccurring indications may be performed as noted.  Right Carotid Findings: +----------+--------+--------+--------+--------+------------------+  PSV cm/sEDV cm/sStenosisDescribeComments           +----------+--------+--------+--------+--------+------------------+ CCA Prox  116     11                                         +----------+--------+--------+--------+--------+------------------+ CCA Distal100     30                                         +----------+--------+--------+--------+--------+------------------+ ICA Prox  80      21                      intimal thickening +----------+--------+--------+--------+--------+------------------+ ICA Mid   81      35                                         +----------+--------+--------+--------+--------+------------------+ ICA Distal107     43                                         +----------+--------+--------+--------+--------+------------------+ ECA       95      11                                          +----------+--------+--------+--------+--------+------------------+ +----------+--------+-------+----------------+------------+           PSV cm/sEDV cmsDescribe        Arm Pressure +----------+--------+-------+----------------+------------+ Subclavian151            Multiphasic, WNL             +----------+--------+-------+----------------+------------+ +---------+--------+--+--------+-+---------+ VertebralPSV cm/s22EDV cm/s5Antegrade +---------+--------+--+--------+-+---------+ Left Carotid Findings: +----------+--------+--------+--------+-----------+--------+           PSV cm/sEDV cm/sStenosisDescribe   Comments +----------+--------+--------+--------+-----------+--------+ CCA Prox  99      27                                  +----------+--------+--------+--------+-----------+--------+ CCA Distal86      30                                  +----------+--------+--------+--------+-----------+--------+ ICA Prox  67      26      1-39%   hyperechoic         +----------+--------+--------+--------+-----------+--------+ ICA Mid   101     40                                  +----------+--------+--------+--------+-----------+--------+ ICA Distal90      36                                  +----------+--------+--------+--------+-----------+--------+ ECA       82      11                                  +----------+--------+--------+--------+-----------+--------+ +----------+--------+--------+----------------+------------+  SubclavianPSV cm/sEDV cm/sDescribe        Arm Pressure +----------+--------+--------+----------------+------------+           135             Multiphasic, WNL             +----------+--------+--------+----------------+------------+ +---------+--------+--+--------+--+---------+ VertebralPSV cm/s38EDV cm/s12Antegrade +---------+--------+--+--------+--+---------+  ABI Findings:  +---------+------------------+-----+----------+--------+ Right    Rt Pressure (mmHg)IndexWaveform  Comment  +---------+------------------+-----+----------+--------+ Brachial 118                    triphasic          +---------+------------------+-----+----------+--------+ PTA      113               0.96 monophasic         +---------+------------------+-----+----------+--------+ DP       144               1.22 biphasic           +---------+------------------+-----+----------+--------+ Great Toe81                0.69 Abnormal           +---------+------------------+-----+----------+--------+ +---------+------------------+-----+----------+-------+ Left     Lt Pressure (mmHg)IndexWaveform  Comment +---------+------------------+-----+----------+-------+ Brachial 115                    triphasic         +---------+------------------+-----+----------+-------+ PTA      93                0.79 monophasic        +---------+------------------+-----+----------+-------+ DP       126               1.07 biphasic          +---------+------------------+-----+----------+-------+ Great Toe64                0.54 Abnormal          +---------+------------------+-----+----------+-------+ Arterial wall calcification precludes accurate ankle pressures and ABIs.  Right Doppler Findings: +--------+--------+-----+---------+--------+ Site    PressureIndexDoppler  Comments +--------+--------+-----+---------+--------+ [id number]          triphasic         +--------+--------+-----+---------+--------+ Radial               OCCLUDED          +--------+--------+-----+---------+--------+ Ulnar                triphasic         +--------+--------+-----+---------+--------+  Left Doppler Findings: +--------+--------+-----+---------+--------+ Site    PressureIndexDoppler  Comments +--------+--------+-----+---------+--------+ [id number]          triphasic          +--------+--------+-----+---------+--------+ Radial               triphasic         +--------+--------+-----+---------+--------+ Ulnar                triphasic         +--------+--------+-----+---------+--------+   Summary: Right Carotid: The extracranial vessels were near-normal with only minimal wall                thickening or plaque. Left Carotid: Velocities in the left ICA are consistent with a 1-39% stenosis. Vertebrals:  Bilateral vertebral arteries demonstrate antegrade flow. Subclavians: Normal flow hemodynamics were seen in bilateral subclavian              arteries. Right ABI: Resting right ankle-brachial index  indicates noncompressible right lower extremity arteries. The right toe-brachial index is abnormal. Although ankle-brachial index is within normal range, waveforms suggest some degree of arterial disease. Left ABI: Resting left ankle-brachial index is within normal range. The left toe-brachial index is abnormal. Although ankle-brachial index is within normal range, waveforms suggest some degree of arterial disease.  Right Upper Extremity: Inconclusive Allen's test secondary to occluded right radial artery s/p catheterization. Left Upper Extremity: Doppler waveform obliterate with left radial compression. Doppler waveforms remain within normal limits with left ulnar compression.  Electronically signed by Servando Snare MD on 11/05/2022 at 6:00:38 PM.    Final    ECHOCARDIOGRAM COMPLETE  Result Date: 11/02/2022    ECHOCARDIOGRAM REPORT   Patient Name:   AMANY RANDO Date of Exam: 11/02/2022 Medical Rec #:  [id number]    Height:       64.0 in Accession #:    [id number]   Weight:       188.9 lb Date of Birth:  September 21, 1956    BSA:          1.910 m Patient Age:    1 years     BP:           105/55 mmHg Patient Gender: F            HR:           86 bpm. Exam Location:  Inpatient Procedure: 2D Echo and Intracardiac Opacification Agent Indications:    NSTEMI  History:        Patient has no prior  history of Echocardiogram examinations.                 CAD; Risk Factors:Hypertension, Diabetes and Dyslipidemia.  Sonographer:    Harvie Junior Referring Phys: [id number] Adrian Prows  Sonographer Comments: Technically difficult study due to poor echo windows. IMPRESSIONS  1. Definity was used for evaluation of wallmotion and EF estimation. Left ventricular ejection fraction, by estimation, is 50 to 55%. Left ventricular ejection fraction by 2D MOD biplane is 54.7 %. The left ventricle has low normal function. The left ventricle demonstrates regional wall motion abnormalities (see scoring diagram/findings for description). There is mild left ventricular hypertrophy. Left ventricular diastolic parameters are consistent with Grade II diastolic dysfunction (pseudonormalization). Elevated left ventricular end-diastolic pressure. There is mild hypokinesis of the left ventricular, entire inferior wall.  2. Right ventricular systolic function is normal. The right ventricular size is normal. There is normal pulmonary artery systolic pressure. The estimated right ventricular systolic pressure is 63.8 mmHg.  3. Left atrial size was mildly dilated.  4. The mitral valve is normal in structure. Mild to moderate mitral valve regurgitation.  5. The aortic valve was not well visualized. Aortic valve regurgitation is not visualized. Aortic valve sclerosis/calcification is present, without any evidence of aortic stenosis.  6. The inferior vena cava is normal in size with greater than 50% respiratory variability, suggesting right atrial pressure of 3 mmHg. FINDINGS  Left Ventricle: Definity was used for evaluation of wallmotion and EF estimation. Left ventricular ejection fraction, by estimation, is 50 to 55%. Left ventricular ejection fraction by 2D MOD biplane is 54.7 %. The left ventricle has low normal function. The left ventricle demonstrates regional wall motion abnormalities. Mild hypokinesis of the left ventricular, entire inferior  wall. Definity contrast agent was given IV to delineate the left ventricular endocardial borders. The left ventricular  internal cavity size was small. There is mild left ventricular hypertrophy. Left ventricular diastolic parameters  are consistent with Grade II diastolic dysfunction (pseudonormalization). Elevated left ventricular end-diastolic pressure. Right Ventricle: The right ventricular size is normal. No increase in right ventricular wall thickness. Right ventricular systolic function is normal. There is normal pulmonary artery systolic pressure. The tricuspid regurgitant velocity is 2.80 m/s, and  with an assumed right atrial pressure of 3 mmHg, the estimated right ventricular systolic pressure is 95.6 mmHg. Left Atrium: Left atrial size was mildly dilated. Right Atrium: Right atrial size was normal in size. Pericardium: There is no evidence of pericardial effusion. Mitral Valve: The mitral valve is normal in structure. Mild to moderate mitral valve regurgitation. Tricuspid Valve: The tricuspid valve is normal in structure. Tricuspid valve regurgitation is mild. Aortic Valve: The aortic valve was not well visualized. Aortic valve regurgitation is not visualized. Aortic valve sclerosis/calcification is present, without any evidence of aortic stenosis. Aortic valve mean gradient measures 5.0 mmHg. Aortic valve peak gradient measures 9.6 mmHg. Aortic valve area, by VTI measures 2.24 cm. Pulmonic Valve: The pulmonic valve was normal in structure. Pulmonic valve regurgitation is not visualized. No evidence of pulmonic stenosis. Aorta: The aortic root is normal in size and structure. Venous: The inferior vena cava is normal in size with greater than 50% respiratory variability, suggesting right atrial pressure of 3 mmHg. IAS/Shunts: No atrial level shunt detected by color flow Doppler.  LEFT VENTRICLE PLAX 2D                        Biplane EF (MOD) LVIDd:         3.50 cm         LV Biplane EF:   Left LVIDs:          2.90 cm                          ventricular LV PW:         1.20 cm                          ejection LV IVS:        1.30 cm                          fraction by LVOT diam:     1.80 cm                          2D MOD LV SV:         61                               biplane is LV SV Index:   32                               54.7 %. LVOT Area:     2.54 cm                                Diastology                                LV e' medial:    5.33 cm/s LV Volumes (MOD)  LV E/e' medial:  17.4 LV vol d, MOD    55.7 ml       LV e' lateral:   8.81 cm/s A2C:                           LV E/e' lateral: 10.5 LV vol d, MOD    88.6 ml A4C: LV vol s, MOD    31.4 ml A2C: LV vol s, MOD    33.4 ml A4C: LV SV MOD A2C:   24.3 ml LV SV MOD A4C:   88.6 ml LV SV MOD BP:    40.7 ml RIGHT VENTRICLE RV Basal diam:  3.30 cm RV Mid diam:    2.50 cm RV S prime:     15.30 cm/s TAPSE (M-mode): 2.3 cm LEFT ATRIUM             Index        RIGHT ATRIUM           Index LA diam:        3.40 cm 1.78 cm/m   RA Area:     10.40 cm LA Vol (A2C):   48.2 ml 25.24 ml/m  RA Volume:   22.00 ml  11.52 ml/m LA Vol (A4C):   50.1 ml 26.24 ml/m LA Biplane Vol: 50.1 ml 26.24 ml/m  AORTIC VALVE                     PULMONIC VALVE AV Area (Vmax):    2.10 cm      PV Vmax:       1.10 m/s AV Area (Vmean):   2.27 cm      PV Peak grad:  4.8 mmHg AV Area (VTI):     2.24 cm AV Vmax:           155.00 cm/s AV Vmean:          110.000 cm/s AV VTI:            0.274 m AV Peak Grad:      9.6 mmHg AV Mean Grad:      5.0 mmHg LVOT Vmax:         128.00 cm/s LVOT Vmean:        98.300 cm/s LVOT VTI:          0.241 m LVOT/AV VTI ratio: 0.88  AORTA Ao Root diam: 3.20 cm Ao Asc diam:  3.20 cm MITRAL VALVE               TRICUSPID VALVE MV Area (PHT): 3.61 cm    TR Peak grad:   31.4 mmHg MV Decel Time: 210 msec    TR Vmax:        280.00 cm/s MR Peak grad: 83.7 mmHg MR Vmax:      457.33 cm/s  SHUNTS MV E velocity: 92.50 cm/s  Systemic VTI:  0.24 m MV A velocity:  80.60 cm/s  Systemic Diam: 1.80 cm MV E/A ratio:  1.15 Adrian Prows MD Electronically signed by Adrian Prows MD Signature Date/Time: 11/02/2022/10:10:39 AM    Final    CARDIAC CATHETERIZATION  Result Date: 11/01/2022 Images from the original result were not included.   Ost Cx to Prox Cx lesion is 80% stenosed.   Dist LAD-3 lesion is 60% stenosed. Dist LAD-2 lesion is 60% stenosed.   Dist LAD-2 lesion is 60% stenosed.   2nd Diag lesion is 90% stenosed.   Ost LAD to  Mid LAD lesion is 30% stenosed.   Mid LAD lesion is 90% stenosed.   Dist LAD-1 lesion is 80% stenosed.   Dist LAD-2 lesion is 40% stenosed.   Mid RCA lesion is 100% stenosed.   RPAV lesion is 60% stenosed.   RPDA lesion is 40% stenosed.   There is mild to moderate left ventricular systolic dysfunction.   LV end diastolic pressure is normal.   The left ventricular ejection fraction is 35-45% by visual estimate. Left Heart Catheterization 11/01/22: LV: 70/0, EDP 11 mmHg.  Aortic pressure 72/44, mean 58 mmHg.  Patient received 500 cc bolus with immediate normalization of blood pressure, 90/66 mmHg. LVEF moderate to severely reduced at 30 to 35%, inferior and septal severe hypokinesis and global hypokinesis. LM: Large vessel, mildly calcified. LAD: Large vessel, gives origin to a moderate-sized D2 with ostial 90% stenosis.  Proximal to D2 there is a focal 90 to 95% stenosis in the LAD.  Mid segment has a 80% stenosis and is diffusely diseased in the mid to distal segment all the way to the apex and moderately diffusely calcified. LCx: Moderate to large caliber vessel, again mild amount of calcification noted throughout the vessel.  Proximal segment has a focal 80% stenosis. RI: Moderate caliber vessel.  Mildly tortuous in the midsegment.  Mild disease. RCA: Very large caliber vessel.  Occluded in the mid segment of very small RV branch.  It has contralateral collaterals from the LAD and circumflex.  There appears to be microchannel's with filling of the RCA all  the way to very large PDA and PL branches.  PL has about a 60% proximal stenosis and PDA has about a 30 to 40% stenosis. Impression: Severe multivessel coronary artery disease.  Moderate to severely reduced LVEF, EF anywhere from 30 to 35%, would ideally be a CABG candidate especially in view of GI bleed and difficulty in dual antiplatelet therapy long-term.  However for now we will manage her medically, obtain the CTS consult electively, do not think that the LAD is bypassable in view of severe disease.  Large RCA with large PDA and PL branch, fairly moderate-sized D2 and large CX are bypassable.   DG Chest Port 1 View  Result Date: 11/01/2022 CLINICAL DATA:  Chest pressure. EXAM: PORTABLE CHEST 1 VIEW COMPARISON:  03/26/2007 FINDINGS: The heart size and mediastinal contours are within normal limits. Both lungs are clear. The visualized skeletal structures are unremarkable. IMPRESSION: No active disease. Electronically Signed   By: Kathreen Devoid M.D.   On: 11/01/2022 11:28     Results for orders placed or performed during the hospital encounter of 11/19/22 (from the past 48 hour(s))  Glucose, capillary     Status: Abnormal   Collection Time: 11/25/22  3:58 PM  Result Value Ref Range   Glucose-Capillary 148 (H) 70 - 99 mg/dL    Comment: Glucose reference range applies only to samples taken after fasting for at least 8 hours.  Glucose, capillary     Status: None   Collection Time: 11/25/22  9:23 PM  Result Value Ref Range   Glucose-Capillary 95 70 - 99 mg/dL    Comment: Glucose reference range applies only to samples taken after fasting for at least 8 hours.  Magnesium     Status: Abnormal   Collection Time: 11/26/22 12:13 AM  Result Value Ref Range   Magnesium 1.6 (L) 1.7 - 2.4 mg/dL    Comment: Performed at [facility] [address] [place], [place]  [address]  Basic metabolic panel     Status: Abnormal   Collection Time: 11/26/22 12:13 AM  Result Value Ref Range   Sodium 140 135 -  145 mmol/L   Potassium 4.2 3.5 - 5.1 mmol/L   Chloride 106 98 - 111 mmol/L   CO2 26 22 - 32 mmol/L   Glucose, Bld 140 (H) 70 - 99 mg/dL    Comment: Glucose reference range applies only to samples taken after fasting for at least 8 hours.   BUN 11 8 - 23 mg/dL   Creatinine, Ser 0.81 0.44 - 1.00 mg/dL   Calcium 9.2 8.9 - 10.3 mg/dL   GFR, Estimated >60 >60 mL/min    Comment: (NOTE) Calculated using the CKD-EPI Creatinine Equation (2021)    Anion gap 8 5 - 15    Comment: Performed at [facility] [address], [place], [place] [address]  Glucose, capillary     Status: Abnormal   Collection Time: 11/26/22  6:22 AM  Result Value Ref Range   Glucose-Capillary 111 (H) 70 - 99 mg/dL    Comment: Glucose reference range applies only to samples taken after fasting for at least 8 hours.  Glucose, capillary     Status: Abnormal   Collection Time: 11/26/22 11:03 AM  Result Value Ref Range   Glucose-Capillary 154 (H) 70 - 99 mg/dL    Comment: Glucose reference range applies only to samples taken after fasting for at least 8 hours.  Glucose, capillary     Status: Abnormal   Collection Time: 11/26/22  4:09 PM  Result Value Ref Range   Glucose-Capillary 148 (H) 70 - 99 mg/dL    Comment: Glucose reference range applies only to samples taken after fasting for at least 8 hours.  Glucose, capillary     Status: Abnormal   Collection Time: 11/26/22  9:12 PM  Result Value Ref Range   Glucose-Capillary 126 (H) 70 - 99 mg/dL    Comment: Glucose reference range applies only to samples taken after fasting for at least 8 hours.   Comment 1 Notify RN    Comment 2 Document in Chart   Glucose, capillary     Status: Abnormal   Collection Time: 11/27/22  6:27 AM  Result Value Ref Range   Glucose-Capillary 113 (H) 70 - 99 mg/dL    Comment: Glucose reference range applies only to samples taken after fasting for at least 8 hours.   Comment 1 Notify RN    Comment 2 Document in Chart   Glucose,  capillary     Status: None   Collection Time: 11/27/22 11:32 AM  Result Value Ref Range   Glucose-Capillary 99 70 - 99 mg/dL    Comment: Glucose reference range applies only to samples taken after fasting for at least 8 hours.      Treatments: surgery:    Operative Report    DATE OF PROCEDURE: 11/21/2022   OPERATION:   1.  Coronary artery bypass grafting x3 (left internal mammary artery to diagonal branch of LAD, saphenous vein graft to posterior descending, saphenous vein graft to distal circumflex posterolateral).  2.  Endoscopic harvest of right leg greater saphenous vein.   PREOPERATIVE DIAGNOSES:  Acute coronary syndrome, severe 3-vessel coronary artery disease, type 2 diabetes, obesity, recent upper gastrointestinal bleed from duodenal ulcer.   POSTOPERATIVE DIAGNOSES:  Acute coronary syndrome, severe 3-vessel coronary artery disease, type 2 diabetes, obesity, recent upper gastrointestinal bleed from duodenal ulcer.   SURGEON:  Collier Salina  Prescott Gum III, MD   ASSISTANT:  Jadene Pierini, PA-C   ANESTHESIA: General by Dr. Albertha Ghee.     Discharge Exam: Blood pressure 129/67, pulse 86, temperature 97.9 F (36.6 C), temperature source Oral, resp. rate 16, height _0  (1.626 m), weight 80.2 kg, SpO2 95 %.   General appearance: alert, cooperative, and no distress Heart: regular rate and rhythm Lungs: clear to auscultation bilaterally Abdomen: benign Extremities: trace edema Wound: incis healing well  Discharge Medications:  The patient has been discharged on:     1.Beta Blocker:  Yes [  y ]                              No   [   ]                              If No, reason:  2.Ace Inhibitor/ARB: Yes [   ]                                     No  [  n ]                                     If No, reason:labile BP  3.Statin:   Yes [  y ]                  No  [   ]                  If No, reason:  4.Ecasa:  Yes  [  y ]                  No   [   ]                   If No, reason:  Patient had ACS upon admission:y  Plavix/P2Y12 inhibitor: Yes [   ]                                      No  [  n ]recent GI bleed     Discharge Instructions     Amb Referral to Cardiac Rehabilitation   Complete by: As directed    Diagnosis: CABG   CABG X ___: 3   After initial evaluation and assessments completed: Virtual Based Care may be provided alone or in conjunction with Phase 2 Cardiac Rehab based on patient barriers.: Yes   Intensive Cardiac Rehabilitation (ICR) [place] location only OR Traditional Cardiac Rehabilitation (TCR) *If criteria for ICR are not met will enroll in TCR [facility] only): Yes      Allergies as of 11/27/2022   No Known Allergies      Medication List     STOP taking these medications    alendronate 70 MG tablet Commonly known as: FOSAMAX   folic acid 1 MG tablet Commonly known as: FOLVITE   methotrexate (PF) 50 MG/2ML injection   metoprolol succinate 25 MG 24 hr tablet Commonly known as: Toprol XL   nitroGLYCERIN 0.4 MG SL tablet Commonly known as: NITROSTAT  TAKE these medications    alum & mag hydroxide-simeth 400-400-40 MG/5ML suspension Commonly known as: MAALOX PLUS Take 10 mLs by mouth every 6 (six) hours as needed for indigestion.   amiodarone 200 MG tablet Commonly known as: PACERONE Take 1 tablet (200 mg total) by mouth every 12 (twelve) hours.   aspirin EC 325 MG tablet Take 1 tablet (325 mg total) by mouth daily. What changed:  medication strength how much to take additional instructions   atorvastatin 80 MG tablet Commonly known as: LIPITOR Take 1 tablet (80 mg total) by mouth at bedtime.   docusate sodium 100 MG capsule Commonly known as: COLACE Take 1 capsule (100 mg total) by mouth 2 (two) times daily.   Fe Fum-Vit C-Vit B12-FA Caps capsule Commonly known as: TRIGELS-F FORTE Take 1 capsule by mouth daily after breakfast. May substitute near equivalent   fenofibrate 160 MG tablet Take  1 tablet (160 mg total) by mouth daily.   Jardiance 25 MG Tabs tablet Generic drug: empagliflozin Take 25 mg by mouth daily.   levothyroxine 100 MCG tablet Commonly known as: SYNTHROID Take 100 mcg by mouth daily before breakfast.   metFORMIN 1000 MG tablet Commonly known as: GLUCOPHAGE Take 2,000 mg by mouth in the morning and at bedtime.   metoprolol tartrate 50 MG tablet Commonly known as: LOPRESSOR Take 1 tablet (50 mg total) by mouth 2 (two) times daily.   multivitamin capsule Take 1 capsule by mouth daily.   ondansetron 4 MG tablet Commonly known as: ZOFRAN Take 4 mg by mouth 3 (three) times daily as needed for nausea or vomiting.   pantoprazole 40 MG tablet Commonly known as: Protonix Take 1 tablet (40 mg total) by mouth daily. Start taking on: December 06, 2022 What changed: Another medication with the same name was removed. Continue taking this medication, and follow the directions you see here.   traMADol 50 MG tablet Commonly known as: ULTRAM Take 1 tablet (50 mg total) by mouth every 6 (six) hours as needed for up to 7 days for moderate pain.        Follow-up Information     [facility] [place] Follow up on 12/27/2022.   Specialty: Cardiothoracic Surgery Why: To get chest xray (at [facility] located on the first floor of Dr. Lucianne Lei Trigt's office buidling) at 2:30PM, 30 minutes before your appointment at [facility] information: [place], [place], Collene Mares, DO Follow up on 12/10/2022.   Specialty: Cardiology Why: Cardiology follow up at 10:00AM Contact information: [place] [address] [phone]                 Signed:  Odis Luster  11/27/2022, 1:05 PM

## 2022-11-25 NOTE — Progress Notes (Signed)
Date and time results received: 11/25/22 01:13  (use smartphrase ".now" to insert current time)  Test: Ca++ Critical Value: 5.5  Name of Provider Notified: redrew  Orders Received? Or Actions Taken?: Ca++ 5.5 and Potassium was 6.0, had BMP redrawn,  Ca ++ 8.2 and Potassium 4.0.

## 2022-11-25 NOTE — Plan of Care (Signed)
Problem: Education: Goal: Understanding of cardiac disease, CV risk reduction, and recovery process will improve Outcome: Progressing Goal: Individualized Educational Video(s) Outcome: Progressing   Problem: Activity: Goal: Ability to tolerate increased activity will improve Outcome: Progressing   Problem: Cardiac: Goal: Ability to achieve and maintain adequate cardiovascular perfusion will improve Outcome: Progressing   Problem: Health Behavior/Discharge Planning: Goal: Ability to safely manage health-related needs after discharge will improve Outcome: Progressing   Problem: Education: Goal: Understanding of CV disease, CV risk reduction, and recovery process will improve Outcome: Progressing Goal: Individualized Educational Video(s) Outcome: Progressing   Problem: Activity: Goal: Ability to return to baseline activity level will improve Outcome: Progressing   Problem: Cardiovascular: Goal: Ability to achieve and maintain adequate cardiovascular perfusion will improve Outcome: Progressing Goal: Vascular access site(s) Level 0-1 will be maintained Outcome: Progressing   Problem: Health Behavior/Discharge Planning: Goal: Ability to safely manage health-related needs after discharge will improve Outcome: Progressing   Problem: Education: Goal: Ability to describe self-care measures that may prevent or decrease complications (Diabetes Survival Skills Education) will improve Outcome: Progressing Goal: Individualized Educational Video(s) Outcome: Progressing   Problem: Coping: Goal: Ability to adjust to condition or change in health will improve Outcome: Progressing   Problem: Fluid Volume: Goal: Ability to maintain a balanced intake and output will improve Outcome: Progressing   Problem: Health Behavior/Discharge Planning: Goal: Ability to identify and utilize available resources and services will improve Outcome: Progressing Goal: Ability to manage health-related  needs will improve Outcome: Progressing   Problem: Metabolic: Goal: Ability to maintain appropriate glucose levels will improve Outcome: Progressing   Problem: Nutritional: Goal: Maintenance of adequate nutrition will improve Outcome: Progressing Goal: Progress toward achieving an optimal weight will improve Outcome: Progressing   Problem: Skin Integrity: Goal: Risk for impaired skin integrity will decrease Outcome: Progressing   Problem: Tissue Perfusion: Goal: Adequacy of tissue perfusion will improve Outcome: Progressing   Problem: Education: Goal: Knowledge of General Education information will improve Description: Including pain rating scale, medication(s)/side effects and non-pharmacologic comfort measures Outcome: Progressing   Problem: Health Behavior/Discharge Planning: Goal: Ability to manage health-related needs will improve Outcome: Progressing   Problem: Clinical Measurements: Goal: Ability to maintain clinical measurements within normal limits will improve Outcome: Progressing Goal: Will remain free from infection Outcome: Progressing Goal: Diagnostic test results will improve Outcome: Progressing Goal: Respiratory complications will improve Outcome: Progressing Goal: Cardiovascular complication will be avoided Outcome: Progressing   Problem: Activity: Goal: Risk for activity intolerance will decrease Outcome: Progressing   Problem: Nutrition: Goal: Adequate nutrition will be maintained Outcome: Progressing   Problem: Coping: Goal: Level of anxiety will decrease Outcome: Progressing   Problem: Elimination: Goal: Will not experience complications related to bowel motility Outcome: Progressing Goal: Will not experience complications related to urinary retention Outcome: Progressing   Problem: Pain Managment: Goal: General experience of comfort will improve Outcome: Progressing   Problem: Safety: Goal: Ability to remain free from injury will  improve Outcome: Progressing   Problem: Skin Integrity: Goal: Risk for impaired skin integrity will decrease Outcome: Progressing   Problem: Education: Goal: Will demonstrate proper wound care and an understanding of methods to prevent future damage Outcome: Progressing Goal: Knowledge of disease or condition will improve Outcome: Progressing Goal: Knowledge of the prescribed therapeutic regimen will improve Outcome: Progressing Goal: Individualized Educational Video(s) Outcome: Progressing   Problem: Activity: Goal: Risk for activity intolerance will decrease Outcome: Progressing   Problem: Cardiac: Goal: Will achieve and/or maintain hemodynamic stability Outcome: Progressing  Problem: Clinical Measurements: Goal: Postoperative complications will be avoided or minimized Outcome: Progressing   Problem: Respiratory: Goal: Respiratory status will improve Outcome: Progressing

## 2022-11-25 NOTE — NC FL2 (Signed)
[place] LEVEL OF CARE SCREENING TOOL     IDENTIFICATION  Patient Name: Mercedes Adams Birthdate: [birth year] Sex: female Admission Date (Current Location): 11/19/2022  [facility] and [place] Number:  Herbalist and Address:  [facility]. 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   Unstable angina ([place]) 11/19/2022   Hypertriglyceridemia 11/03/2022   Acute GI bleeding 11/02/2022   Duodenal ulcer 11/02/2022   Acute heart failure with preserved ejection fraction (HFpEF) ([place]) 11/02/2022   Acute blood loss anemia 11/02/2022   Class 1 obesity due to excess calories with serious comorbidity and body mass index (BMI) of 32.0 to 32.9 in adult 11/02/2022   NSTEMI (non-ST elevated myocardial infarction) ([place]) 11/01/2022   DM (diabetes mellitus) ([place]) 11/01/2022   Essential hypertension 11/01/2022   Mixed hyperlipidemia 11/01/2022   Coronary artery disease involving native coronary artery of native heart without angina pectoris 11/01/2022    Orientation RESPIRATION BLADDER Height & Weight     Self, Time, Situation, Place  O2 Continent Weight: 181 lb 14.4 oz (82.5 kg) Height:  '5\' 4"'$  (162.6 cm)  BEHAVIORAL SYMPTOMS/MOOD NEUROLOGICAL BOWEL NUTRITION STATUS      Continent Diet (see discharge summary)  AMBULATORY STATUS COMMUNICATION OF NEEDS Skin   Supervision Verbally Normal                       Personal Care Assistance Level of Assistance  Bathing, Feeding, Dressing Bathing  Assistance: Limited assistance Feeding assistance: Independent Dressing Assistance: Limited assistance     Functional Limitations Info  Sight, Hearing, Speech Sight Info: Adequate Hearing Info: Adequate Speech Info: Adequate    SPECIAL CARE FACTORS FREQUENCY  PT (By licensed PT), OT (By licensed OT)     PT Frequency: 5x week OT Frequency: 5x week            Contractures Contractures Info: Not present    Additional Factors Info  Code Status, Allergies, Insulin Sliding Scale Code Status Info: full Allergies Info: NKA   Insulin Sliding Scale Info: Novolog: see discharge summary       Current Medications (11/25/2022):  This is the current hospital active medication list Current Facility-Administered Medications  Medication Dose Route Frequency Provider Last Rate Last Admin   0.45 % sodium chloride infusion   Intravenous Continuous PRN Gold, Wayne E, PA-C   Stopped at 11/22/22 1010   0.9 %  sodium chloride infusion  250 mL Intravenous Continuous Gold, Wayne E, PA-C       0.9 %  sodium chloride infusion   Intravenous Continuous Gold, Wayne E, PA-C   Stopped at 11/22/22 1509   acetaminophen (TYLENOL) tablet 1,000 mg  1,000 mg Oral Q6H Gold, Wayne E, PA-C   1,000 mg at 11/25/22 5329   Or   acetaminophen (TYLENOL) 160 MG/5ML solution 1,000 mg  1,000 mg Per Tube Q6H Gold, Wayne E, PA-C       amiodarone (NEXTERONE) 1.8 mg/mL load via infusion 150 mg  150 mg Intravenous Once Gold, Wayne E, PA-C       amiodarone (PACERONE) tablet 400 mg  400 mg Oral Q12H Gold, Wayne E, PA-C       Followed by   Derrill Memo ON 12/03/2022] amiodarone (PACERONE) tablet 400 mg  400 mg Oral Daily Gold, Wayne E, PA-C       aspirin EC tablet 325 mg  325 mg Oral Daily Gold, Wayne E, PA-C   325 mg at 11/25/22 0865   Or   aspirin chewable tablet 324 mg  324 mg Per Tube Daily Gold, Wayne E, PA-C       atorvastatin (LIPITOR) tablet 80 mg  80 mg Oral QHS Gold, Wayne E, PA-C   80 mg at 11/24/22 2147   bisacodyl  (DULCOLAX) EC tablet 10 mg  10 mg Oral Daily Gold, Patrick Jupiter E, PA-C   10 mg at 11/25/22 7846   Or   bisacodyl (DULCOLAX) suppository 10 mg  10 mg Rectal Daily Gold, Wayne E, PA-C       Chlorhexidine Gluconate Cloth 2 % PADS 6 each  6 each Topical Daily Dahlia Byes, MD   6 each at 11/24/22 0910   dextrose 50 % solution 0-50 mL  0-50 mL Intravenous PRN Jadene Pierini E, PA-C       docusate sodium (COLACE) capsule 200 mg  200 mg Oral Daily Gold, Wayne E, PA-C   200 mg at 11/25/22 9629   empagliflozin (JARDIANCE) tablet 25 mg  25 mg Oral Daily Coralie Common, MD       enoxaparin (LOVENOX) injection 40 mg  40 mg Subcutaneous QHS Coralie Common, MD   40 mg at 11/24/22 2147   Fe Fum-Vit C-Vit B12-FA (TRIGELS-F FORTE) capsule 1 capsule  1 capsule Oral QPC breakfast Dahlia Byes, MD   1 capsule at 11/25/22 0839   feeding supplement (GLUCERNA SHAKE) (GLUCERNA SHAKE) liquid 237 mL  237 mL Oral TID BM Coralie Common, MD   237 mL at 11/25/22 0853   fenofibrate tablet 160 mg  160 mg Oral Daily Gold, Wayne E, PA-C   160 mg at 11/25/22 5284   furosemide (LASIX) tablet 40 mg  40 mg Oral Daily Gold, Wayne E, PA-C   40 mg at 11/24/22 0905   insulin aspart (novoLOG) injection 0-24 Units  0-24 Units Subcutaneous TID AC & HS Dahlia Byes, MD   4 Units at 11/24/22 2145   insulin detemir (LEVEMIR) injection 5 Units  5 Units Subcutaneous Daily Coralie Common, MD   5 Units at 11/25/22 0843   insulin regular, human (MYXREDLIN) 100 units/ 100 mL infusion   Intravenous Continuous Gold, Wayne E, PA-C   Stopped at 11/22/22 1432   lactated ringers infusion 500 mL  500 mL Intravenous Once PRN Gold, Wayne E, PA-C       lactated ringers infusion   Intravenous Continuous Gold, Wayne E, PA-C       lactated ringers infusion   Intravenous Continuous Gold, Wayne E, PA-C   Stopped at 11/22/22 1509   levothyroxine (SYNTHROID) tablet 100 mcg  100 mcg Oral Q0600 Jadene Pierini E, PA-C   100 mcg at 11/25/22 1324   metFORMIN (GLUCOPHAGE) tablet  1,000 mg  1,000 mg Oral BID WC Coralie Common, MD       metoCLOPramide (REGLAN) injection 10 mg  10 mg Intravenous Q6H Dahlia Byes, MD   10 mg at 11/25/22 4010   metoprolol tartrate (LOPRESSOR) tablet 50 mg  50 mg Oral BID Coralie Common, MD  50 mg at 11/25/22 0838   midazolam (VERSED) injection 2 mg  2 mg Intravenous Q1H PRN Jadene Pierini E, PA-C       morphine (PF) 2 MG/ML injection 1-4 mg  1-4 mg Intravenous Q1H PRN Gold, Wayne E, PA-C   2 mg at 11/21/22 2104   mupirocin ointment (BACTROBAN) 2 %   Nasal BID Dahlia Byes, MD   Given at 11/25/22 0849   nitroGLYCERIN 50 mg in dextrose 5 % 250 mL (0.2 mg/mL) infusion  0-100 mcg/min Intravenous Titrated John Giovanni, PA-C   Held at 11/21/22 1515   norepinephrine (LEVOPHED) 16 mg in 241m (0.064 mg/mL) premix infusion  0-20 mcg/min Intravenous Titrated VDahlia Byes MD   Stopped at 11/22/22 0756   ondansetron (ZOFRAN) injection 4 mg  4 mg Intravenous Q6H PRN GJadene PieriniE, PA-C   4 mg at 11/22/22 1129   Oral care mouth rinse  15 mL Mouth Rinse PRN VDahlia Byes MD       pantoprazole (PROTONIX) injection 40 mg  40 mg Intravenous Q12H Gold, Wayne E, PA-C   40 mg at 11/25/22 0843   phenylephrine (NEO-SYNEPHRINE) '20mg'$ /NS 2542mpremix infusion  0-400 mcg/min Intravenous Titrated Gold, WaPatrick Jupiter, PA-C   Stopped at 11/21/22 2307   potassium chloride SA (KLOR-CON M) CR tablet 20 mEq  20 mEq Oral Daily WeCoralie CommonMD   20 mEq at 11/25/22 0838   sodium chloride flush (NS) 0.9 % injection 3 mL  3 mL Intravenous Q12H Gold, Wayne E, PA-C   3 mL at 11/25/22 0843   sodium chloride flush (NS) 0.9 % injection 3 mL  3 mL Intravenous PRN GoJadene Pierini, PA-C   3 mL at 11/25/22 0843   traMADol (ULTRAM) tablet 50-100 mg  50-100 mg Oral Q4H PRN GoJadene Pierini, PA-C   50 mg at 11/23/22 1256     Discharge Medications: Please see discharge summary for a list of discharge medications.  Relevant Imaging Results:  Relevant Lab Results:   Additional  Information SSN: [id number] CharsLCSW

## 2022-11-25 NOTE — TOC Initial Note (Signed)
Transition of Care [facility]) - Initial/Assessment Note    Patient Details  Name: Mercedes Adams MRN: [id number] Date of Birth: Sep 03, 1956  Transition of Care [facility]) CM/SW Contact:    Joanne Chars, LCSW Phone Number: 11/25/2022, 9:59 AM  Clinical Narrative:    CSW met with pt and sister Freda Munro regarding DC recommendation for SNF.  Permission given to speak with sister present and also with husband Yvone Neu.  Pt does want to pursue SNF.  Husband recently had both stroke and hip surgery, he is managing at home with some help, but unable to assist pt.  Pt would like SNF to she is better able to care for self and assist husband.  Choice document given, first choice would be [facility].                Expected Discharge Plan: Skilled Nursing Facility Barriers to Discharge: Continued Medical Work up, SNF Pending bed offer   Patient Goals and CMS Choice Patient states their goals for this hospitalization and ongoing recovery are:: back to normal, eventually back to work [email or web address] Compare Post Acute Care list provided to:: Patient Choice offered to / list presented to : Patient  Expected Discharge Plan and Services Expected Discharge Plan: [place] In-house Referral: Clinical Social Work   Post Acute Care Choice: [place] Living arrangements for the past 2 months: [facility]                                      Prior Living Arrangements/Services Living arrangements for the past 2 months: Single Family Home Lives with:: Spouse Patient language and need for interpreter reviewed:: Yes Do you feel safe going back to the place where you live?: Yes      Need for Family Participation in Patient Care: No (Comment) Care giver support system in place?: Yes (comment) Current home services: Other (comment) (none) Criminal Activity/Legal Involvement Pertinent to Current Situation/Hospitalization: No - Comment as needed  Activities of Daily  Living Home Assistive Devices/Equipment: CBG Meter ADL Screening (condition at time of admission) Patient's cognitive ability adequate to safely complete daily activities?: Yes Is the patient deaf or have difficulty hearing?: Yes Does the patient have difficulty seeing, even when wearing glasses/contacts?: No Does the patient have difficulty concentrating, remembering, or making decisions?: Yes Patient able to express need for assistance with ADLs?: No Does the patient have difficulty dressing or bathing?: No Independently performs ADLs?: Yes (appropriate for developmental age) Does the patient have difficulty walking or climbing stairs?: Yes Weakness of Legs: Right Weakness of Arms/Hands: None  Permission Sought/Granted Permission sought to share information with : Family Supports Permission granted to share information with : Yes, Verbal Permission Granted  Share Information with NAME: sister Freda Munro, husband Yvone Neu  Permission granted to share info w AGENCY: SNF        Emotional Assessment Appearance:: Appears stated age Attitude/Demeanor/Rapport: Engaged Affect (typically observed): Appropriate, Pleasant Orientation: : Oriented to Self, Oriented to Place, Oriented to  Time, Oriented to Situation      Admission diagnosis:  Precordial pain [R07.2] Unstable angina (HCC) [I20.0] S/P CABG x 3 [Z95.1] Patient Active Problem List   Diagnosis Date Noted   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   Unstable angina ([place]) 11/19/2022   Hypertriglyceridemia 11/03/2022   Acute GI bleeding 11/02/2022   Duodenal ulcer 11/02/2022   Acute heart failure with preserved ejection  fraction (HFpEF) ([place]) 11/02/2022   Acute blood loss anemia 11/02/2022   Class 1 obesity due to excess calories with serious comorbidity and body mass index (BMI) of 32.0 to 32.9 in adult 11/02/2022   NSTEMI (non-ST elevated myocardial infarction) ([place]) 11/01/2022   DM (diabetes mellitus) ([place]) 11/01/2022   Essential hypertension  11/01/2022   Mixed hyperlipidemia 11/01/2022   Coronary artery disease involving native coronary artery of native heart without angina pectoris 11/01/2022   PCP:  Leonides Sake, MD Pharmacy:   [facility] [place], [address] AT [address] [place] [address] Phone: [phone] Fax: [phone]  [facility] [address] [place] [address] Phone: [phone] Fax: [phone]     Social Determinants of Health (SDOH) Interventions    Readmission Risk Interventions     No data to display

## 2022-11-26 LAB — BASIC METABOLIC PANEL
Anion gap: 8 (ref 5–15)
BUN: 11 mg/dL (ref 8–23)
CO2: 26 mmol/L (ref 22–32)
Calcium: 9.2 mg/dL (ref 8.9–10.3)
Chloride: 106 mmol/L (ref 98–111)
Creatinine, Ser: 0.81 mg/dL (ref 0.44–1.00)
GFR, Estimated: >60 mL/min (ref >60)
Glucose, Bld: 140 mg/dL — ABNORMAL HIGH (ref 70–99)
Potassium: 4.2 mmol/L (ref 3.5–5.1)
Sodium: 140 mmol/L (ref 135–145)

## 2022-11-26 LAB — GLUCOSE, CAPILLARY
Glucose-Capillary: 152 mg/dL — ABNORMAL HIGH (ref 70–99)
Glucose-Capillary: 111 mg/dL — ABNORMAL HIGH (ref 70–99)
Glucose-Capillary: 154 mg/dL — ABNORMAL HIGH (ref 70–99)
Glucose-Capillary: 148 mg/dL — ABNORMAL HIGH (ref 70–99)
Glucose-Capillary: 126 mg/dL — ABNORMAL HIGH (ref 70–99)

## 2022-11-26 LAB — MAGNESIUM: Magnesium: 1.6 mg/dL — ABNORMAL LOW (ref 1.7–2.4)

## 2022-11-26 MED ORDER — PANTOPRAZOLE SODIUM 40 MG PO TBEC
40.0000 mg | DELAYED_RELEASE_TABLET | Freq: Two times a day (BID) | ORAL | Status: DC
  Filled 2022-11-26: qty 1

## 2022-11-26 MED ORDER — PANTOPRAZOLE SODIUM 40 MG PO TBEC
40.0000 mg | DELAYED_RELEASE_TABLET | Freq: Two times a day (BID) | ORAL | Status: DC
  Administered 2022-11-26 – 2022-11-27 (×2): 40 mg via ORAL
  Filled 2022-11-26 (×2): qty 1

## 2022-11-26 MED ORDER — AMIODARONE HCL 200 MG PO TABS
200.0000 mg | ORAL_TABLET | Freq: Two times a day (BID) | ORAL | Status: DC
  Administered 2022-11-26 – 2022-11-27 (×2): 200 mg via ORAL
  Filled 2022-11-26 (×2): qty 1

## 2022-11-26 NOTE — Progress Notes (Signed)
CARDIAC REHAB PHASE I   PRE:  Rate/Rhythm: 84 SR    BP: sitting 123/63    SaO2: 96 RA  MODE:  Ambulation: 340 ft   POST:  Rate/Rhythm: 107 ST    BP: sitting 137/75     SaO2: 96 RA  Pt stood with min assist following sternal precautions. Ambulated with RW. Steady gait. Fatigue with distance, VSS. Return to recliner. She has been practicing IS. Encouraged x2 more walks today. Pt prefers rollator (not available during session).  [place] BS, ACSM-CEP 11/26/2022 9:38 AM

## 2022-11-26 NOTE — Progress Notes (Addendum)
5 Days Post-Op Procedure(s) (LRB): CORONARY ARTERY BYPASS GRAFTING (CABG) X 3, USING LEFT INTERNAL MAMMARY ARTERY AND ENDOSCOPICALLY HARVESTED RIGHT GREATER SAPHENOUS VEIN. (N/A) TRANSESOPHAGEAL ECHOCARDIOGRAM (TEE) (N/A) Subjective: Feels ok , did not walk yesterday  Objective: Vital signs in last 24 hours: Temp:  [97.8 F (36.6 C)-98.7 F (37.1 C)] 98.7 F (37.1 C) (11/27 0350) Pulse Rate:  [76-95] 80 (11/27 0350) Cardiac Rhythm: Normal sinus rhythm (11/26 1926) Resp:  [18-25] 18 (11/27 0350) BP: (110-135)/(62-80) 110/62 (11/27 0350) SpO2:  [93 %-98 %] 93 % (11/27 0350) Weight:  [81.4 kg] 81.4 kg (11/27 0630)  Hemodynamic parameters for last 24 hours:    Intake/Output from previous day: 11/26 0701 - 11/27 0700 In: 868.3 [P.O.:789; I.V.:79.3] Out: -  Intake/Output this shift: No intake/output data recorded.  General appearance: alert, cooperative, and no distress Heart: regular rate and rhythm and occasional irregular beat Lungs: clear to auscultation bilaterally Abdomen: Benign exam Extremities: Minor lower extremity edema Wound: Incisions healing well without evidence of infection  Lab Results: Recent Labs    11/24/22 0441 11/25/22 0025  WBC 10.3 10.4  HGB 7.7* 8.6*  HCT 25.2* 27.1*  PLT 155 148*   BMET:  Recent Labs    11/25/22 0139 11/26/22 0013  NA 140 140  K 4.0 4.2  CL 108 106  CO2 26 26  GLUCOSE 119* 140*  BUN 11 11  CREATININE 0.78 0.81  CALCIUM 8.2* 9.2    PT/INR: No results for input(s): "LABPROT", "INR" in the last 72 hours. ABG    Component Value Date/Time   PHART 7.344 (L) 11/21/2022 1950   HCO3 24.2 11/21/2022 1950   TCO2 26 11/21/2022 1950   ACIDBASEDEF 2.0 11/21/2022 1950   O2SAT 98 11/21/2022 1950   CBG (last 3)  Recent Labs    11/25/22 1558 11/25/22 2123 11/26/22 0622  GLUCAP 148* 95 111*    Meds Scheduled Meds:  acetaminophen  1,000 mg Oral Q6H   Or   acetaminophen (TYLENOL) oral liquid 160 mg/5 mL  1,000 mg Per  Tube Q6H   amiodarone  400 mg Oral Q12H   Followed by   Derrill Memo ON 12/03/2022] amiodarone  400 mg Oral Daily   aspirin EC  325 mg Oral Daily   Or   aspirin  324 mg Per Tube Daily   atorvastatin  80 mg Oral QHS   bisacodyl  10 mg Oral Daily   Or   bisacodyl  10 mg Rectal Daily   docusate sodium  200 mg Oral Daily   empagliflozin  25 mg Oral Daily   enoxaparin (LOVENOX) injection  40 mg Subcutaneous QHS   Fe Fum-Vit C-Vit B12-FA  1 capsule Oral QPC breakfast   feeding supplement (GLUCERNA SHAKE)  237 mL Oral TID BM   fenofibrate  160 mg Oral Daily   furosemide  40 mg Oral Daily   insulin aspart  0-24 Units Subcutaneous TID AC & HS   insulin detemir  5 Units Subcutaneous Daily   levothyroxine  100 mcg Oral Q0600   metFORMIN  1,000 mg Oral BID WC   metoCLOPramide (REGLAN) injection  10 mg Intravenous Q6H   metoprolol tartrate  50 mg Oral BID   mupirocin ointment   Nasal BID   pantoprazole (PROTONIX) IV  40 mg Intravenous Q12H   potassium chloride  20 mEq Oral Daily   sodium chloride flush  3 mL Intravenous Q12H   Continuous Infusions:  sodium chloride Stopped (11/22/22 1010)   sodium chloride  sodium chloride Stopped (11/22/22 1509)   insulin Stopped (11/22/22 1432)   lactated ringers     lactated ringers     lactated ringers Stopped (11/22/22 1509)   nitroGLYCERIN Stopped (11/21/22 1515)   norepinephrine (LEVOPHED) Adult infusion Stopped (11/22/22 0756)   phenylephrine (NEO-SYNEPHRINE) Adult infusion Stopped (11/21/22 2307)   PRN Meds:.sodium chloride, dextrose, lactated ringers, midazolam, morphine injection, ondansetron (ZOFRAN) IV, mouth rinse, sodium chloride flush, traMADol  Xrays No results found.  Assessment/Plan: S/P Procedure(s) (LRB): CORONARY ARTERY BYPASS GRAFTING (CABG) X 3, USING LEFT INTERNAL MAMMARY ARTERY AND ENDOSCOPICALLY HARVESTED RIGHT GREATER SAPHENOUS VEIN. (N/A) TRANSESOPHAGEAL ECHOCARDIOGRAM (TEE) (N/A) POD #6 s/p CABG  1 afebrile, VSS, sinus  rhythm-now on oral amiodarone for atrial fibrillation, occasional PAC 2 oxygen saturations are good on room air 3 urine output not recorded, weight trending lower, normal renal function 4 blood sugars are adequately controlled on home meds 5 TSH normal range, she is on thyroid supplement chronically 6 social work is on board, she needs short-term nursing facility placement and she has nobody who can stay with her.    LOS: 7 days    Gaspar Bidding Pager [phone] 11/26/2022  Patient taken off oxygen within the past 12-24 hours, continue Lasix and monitor O2 saturation. Maintaining sinus rhythm with oral amiodarone, reduce dose to 200 mg twice daily Transition IV Protonix to oral Protonix twice daily with history of recent duodenal ulcer GI bleed which was related to her non-STEMI. Expected plan for discharge is skilled nursing facility for patient's safety..  patient examined and medical record reviewed,agree with above note. Mercedes Adams 11/26/2022

## 2022-11-26 NOTE — Progress Notes (Signed)
Occupational Therapy Treatment Patient Details Name: Mercedes Adams MRN: [id number] DOB: 03-25-1956 Today's Date: 11/26/2022   History of present illness Pt is 66 year old presented to [facility] on  11/19/22 with chest pain. Pt underwent CABG x 3 on 11/21/22. PMH - CAD, recent NSTEMI, recent GI bleed, anxiety, arthritis, DM, HTN,   OT comments  Pt is making great progress towards her acute goals and remains motivated to return to her independence. Overall she independently maintained sternal precautions 100% of the session and completed all transfers with supervision A. Pt tolerated ~18 minutes of functional activity with supervision, rollator and with 1 sitting rest break due to increased WOB. VSS on RA, HR to 97 with activity. Pt continues to benefit acutely. POC remains appropriate.    Recommendations for follow up therapy are one component of a multi-disciplinary discharge planning process, led by the attending physician.  Recommendations may be updated based on patient status, additional functional criteria and insurance authorization.    Follow Up Recommendations  Skilled nursing-short term rehab (<3 hours/day)     Assistance Recommended at Discharge Frequent or constant Supervision/Assistance  Patient can return home with the following  A little help with walking and/or transfers;A little help with bathing/dressing/bathroom;Assistance with cooking/housework;Direct supervision/assist for medications management;Direct supervision/assist for financial management;Assist for transportation;Help with stairs or ramp for entrance   Equipment Recommendations  None recommended by OT    Recommendations for Other Services      Precautions / Restrictions Precautions Precautions: Sternal;Fall Precaution Booklet Issued: Yes (comment) Restrictions Weight Bearing Restrictions: No Other Position/Activity Restrictions: sternal precautions       Mobility Bed Mobility Overal bed mobility: Needs  Assistance Bed Mobility: Rolling, Sidelying to Sit Rolling: Supervision Sidelying to sit: Min assist       General bed mobility comments: from a flat surface, no rails    Transfers Overall transfer level: Needs assistance Equipment used: Rollator (4 wheels) Transfers: Sit to/from Stand Sit to Stand: Supervision           General transfer comment: no cues needed     Balance Overall balance assessment: Needs assistance Sitting-balance support: No upper extremity supported, Feet supported Sitting balance-Leahy Scale: Good     Standing balance support: Single extremity supported, During functional activity Standing balance-Leahy Scale: Fair                             ADL either performed or assessed with clinical judgement   ADL Overall ADL's : Needs assistance/impaired     Grooming: Supervision/safety;Standing                   Toilet Transfer: Supervision/safety;Ambulation;Rollator (4 wheels) Toilet Transfer Details (indicate cue type and reason): simulated with ambulation and sit<>stand.         Functional mobility during ADLs: Supervision/safety;Rollator (4 wheels) General ADL Comments: generalized supervision for safety only. 1 sitting rest break needed after walking >household distance with rollator. indep maintained sternal precautions throughout    Extremity/Trunk Assessment Upper Extremity Assessment Upper Extremity Assessment: Overall WFL for tasks assessed;Generalized weakness   Lower Extremity Assessment Lower Extremity Assessment: Defer to PT evaluation        Vision   Vision Assessment?: No apparent visual deficits   Perception Perception Perception: Not tested   Praxis Praxis Praxis: Not tested    Cognition Arousal/Alertness: Awake/alert Behavior During Therapy: Flat affect Overall Cognitive Status: Within Functional Limits for tasks assessed  General Comments: great  recall and carryover of sternal precautions for mobility and ADLs        Exercises      Shoulder Instructions       General Comments VSS on RA. HR to 97 with activity    Pertinent Vitals/ Pain       Pain Assessment Pain Assessment: No/denies pain  Home Living                                          Prior Functioning/Environment              Frequency  Min 2X/week        Progress Toward Goals  OT Goals(current goals can now be found in the care plan section)  Progress towards OT goals: Progressing toward goals  Acute Rehab OT Goals Patient Stated Goal: to be indep OT Goal Formulation: With patient Time For Goal Achievement: 12/08/22 Potential to Achieve Goals: Good ADL Goals Pt Will Perform Upper Body Dressing: with modified independence;sitting Pt Will Perform Lower Body Dressing: with supervision;sit to/from stand Pt Will Transfer to Toilet: with supervision;ambulating Additional ADL Goal #1: Pt will indep maintain sternal precautions 100% of session  Plan      Co-evaluation                 AM-PAC OT "6 Clicks" Daily Activity     Outcome Measure   Help from another person eating meals?: None Help from another person taking care of personal grooming?: A Little Help from another person toileting, which includes using toliet, bedpan, or urinal?: A Little Help from another person bathing (including washing, rinsing, drying)?: A Little Help from another person to put on and taking off regular upper body clothing?: A Little Help from another person to put on and taking off regular lower body clothing?: A Little 6 Click Score: 19    End of Session Equipment Utilized During Treatment: Rollator (4 wheels)  OT Visit Diagnosis: Unsteadiness on feet (R26.81);Other abnormalities of gait and mobility (R26.89);Muscle weakness (generalized) (M62.81)   Activity Tolerance Patient tolerated treatment well   Patient Left with call  bell/phone within reach;with family/visitor present;in chair   Nurse Communication Mobility status        Time: 2706-2376 OT Time Calculation (min): 24 min  Charges: OT General Charges $OT Visit: 1 Visit OT Treatments $Therapeutic Activity: 23-37 mins    Elliot Cousin 11/26/2022, 11:47 AM

## 2022-11-26 NOTE — Progress Notes (Signed)
Mobility Specialist Progress Note    11/26/22 1453  Mobility  Activity Ambulated with assistance in hallway  Level of Assistance Standby assist, set-up cues, supervision of patient - no hands on  Assistive Device Four wheel walker  Distance Ambulated (ft) 420 ft (300+120)  Activity Response Tolerated well  Mobility Referral Yes  $Mobility charge 1 Mobility   Pre-Mobility: 89 HR, 98% SpO2 During Mobility: 101 HR Post-Mobility: 89 HR  Pt received in chair and agreeable. No complaints on walk. Took x1 seated rest break. Returned to chair with call bell in reach.    Hildred Alamin Mobility Specialist  Please Psychologist, sport and exercise or Rehab Office at [phone]

## 2022-11-26 NOTE — Progress Notes (Signed)
Discussed with pt and husband IS, sternal precautions, diet, exercise, and CRPII. Pt receptive. Will refer to [place]. Gave d/c video to view when ready. Yves Dill BS, ACSM-CEP 11/26/2022 2:35 PM [phone]

## 2022-11-26 NOTE — Care Management Important Message (Signed)
Important Message  Patient Details  Name: Mercedes Adams MRN: [id number] Date of Birth: December 05, 1956   Medicare Important Message Given:  Yes     [name] [address] 11/26/2022, 3:04 PM

## 2022-11-27 LAB — GLUCOSE, CAPILLARY
Glucose-Capillary: 113 mg/dL — ABNORMAL HIGH (ref 70–99)
Glucose-Capillary: 99 mg/dL (ref 70–99)

## 2022-11-27 MED ORDER — TRAMADOL HCL 50 MG PO TABS: 50.0000 mg | ORAL_TABLET | Freq: Four times a day (QID) | ORAL | 0 refills | Status: AC | PRN

## 2022-11-27 MED ORDER — ASPIRIN 325 MG PO TBEC: 325.0000 mg | DELAYED_RELEASE_TABLET | Freq: Every day | ORAL | Status: AC

## 2022-11-27 MED ORDER — AMIODARONE HCL 200 MG PO TABS: 200.0000 mg | ORAL_TABLET | Freq: Two times a day (BID) | ORAL | Status: DC

## 2022-11-27 MED ORDER — FE FUM-VIT C-VIT B12-FA 460-60-0.01-1 MG PO CAPS: 1.0000 | ORAL_CAPSULE | Freq: Every day | ORAL | 0 refills | Status: AC

## 2022-11-27 MED ORDER — METOPROLOL TARTRATE 50 MG PO TABS: 50.0000 mg | ORAL_TABLET | Freq: Two times a day (BID) | ORAL | Status: DC

## 2022-11-27 NOTE — Progress Notes (Signed)
Pt in chair resting comfortably with feet elevated, pt correctly recalled sternal precautions and restrictions. Pt was encouraged to continue working with PT and do CRPII. Referral already placed.   Mercedes Adams 11:21 AM 11/27/2022

## 2022-11-27 NOTE — Progress Notes (Signed)
RN removed sutures and PIV. Pt and family have d/c summary packet and printed Rx to take to receiving facility. Belongings w/ pt. RN called [facility] and gave report to [employer], Therapist, sports. Nts transporting pt to private vehicle where family will transport pt to [facility].

## 2022-11-27 NOTE — Progress Notes (Addendum)
[address]       [place], [address]             [phone]     6 Days Post-Op Procedure(s) (LRB): CORONARY ARTERY BYPASS GRAFTING (CABG) X 3, USING LEFT INTERNAL MAMMARY ARTERY AND ENDOSCOPICALLY HARVESTED RIGHT GREATER SAPHENOUS VEIN. (N/A) TRANSESOPHAGEAL ECHOCARDIOGRAM (TEE) (N/A) Subjective: Feels ok , has been accepted to [facility]  Objective: Vital signs in last 24 hours: Temp:  [97.9 F (36.6 C)-98.4 F (36.9 C)] 97.9 F (36.6 C) (11/28 0727) Pulse Rate:  [79-88] 87 (11/28 0727) Cardiac Rhythm: Normal sinus rhythm (11/28 0724) Resp:  [16-21] 16 (11/28 0727) BP: (113-137)/(61-76) 129/67 (11/28 0727) SpO2:  [93 %-97 %] 95 % (11/28 0727) Weight:  [80.2 kg] 80.2 kg (11/28 0433)  Hemodynamic parameters for last 24 hours:    Intake/Output from previous day: No intake/output data recorded. Intake/Output this shift: No intake/output data recorded.  General appearance: alert, cooperative, and no distress Heart: regular rate and rhythm Lungs: clear to auscultation bilaterally Abdomen: benign Extremities: trace edema Wound: incis healing well  Lab Results: Recent Labs    11/25/22 0025  WBC 10.4  HGB 8.6*  HCT 27.1*  PLT 148*   BMET:  Recent Labs    11/25/22 0139 11/26/22 0013  NA 140 140  K 4.0 4.2  CL 108 106  CO2 26 26  GLUCOSE 119* 140*  BUN 11 11  CREATININE 0.78 0.81  CALCIUM 8.2* 9.2    PT/INR: No results for input(s): "LABPROT", "INR" in the last 72 hours. ABG    Component Value Date/Time   PHART 7.344 (L) 11/21/2022 1950   HCO3 24.2 11/21/2022 1950   TCO2 26 11/21/2022 1950   ACIDBASEDEF 2.0 11/21/2022 1950   O2SAT 98 11/21/2022 1950   CBG (last 3)  Recent Labs    11/26/22 1609 11/26/22 2112 11/27/22 0627  GLUCAP 148* 126* 113*    Meds Scheduled Meds:  amiodarone  200 mg Oral Q12H   aspirin EC  325 mg Oral Daily   Or   aspirin  324 mg Per Tube Daily   atorvastatin  80 mg Oral QHS   bisacodyl  10  mg Oral Daily   Or   bisacodyl  10 mg Rectal Daily   docusate sodium  200 mg Oral Daily   empagliflozin  25 mg Oral Daily   enoxaparin (LOVENOX) injection  40 mg Subcutaneous QHS   Fe Fum-Vit C-Vit B12-FA  1 capsule Oral QPC breakfast   feeding supplement (GLUCERNA SHAKE)  237 mL Oral TID BM   fenofibrate  160 mg Oral Daily   furosemide  40 mg Oral Daily   insulin aspart  0-24 Units Subcutaneous TID AC & HS   insulin detemir  5 Units Subcutaneous Daily   levothyroxine  100 mcg Oral Q0600   metFORMIN  1,000 mg Oral BID WC   metoprolol tartrate  50 mg Oral BID   mupirocin ointment   Nasal BID   pantoprazole  40 mg Oral BID   potassium chloride  20 mEq Oral Daily   sodium chloride flush  3 mL Intravenous Q12H   Continuous Infusions:  sodium chloride Stopped (11/22/22 1010)   sodium chloride     sodium chloride Stopped (11/22/22 1509)   lactated ringers     PRN Meds:.sodium chloride, dextrose, ondansetron (ZOFRAN) IV, mouth rinse, sodium chloride flush, traMADol  Xrays No results found.  Assessment/Plan: S/P Procedure(s) (LRB): CORONARY ARTERY BYPASS  GRAFTING (CABG) X 3, USING LEFT INTERNAL MAMMARY ARTERY AND ENDOSCOPICALLY HARVESTED RIGHT GREATER SAPHENOUS VEIN. (N/A) TRANSESOPHAGEAL ECHOCARDIOGRAM (TEE) (N/A) POD#7  1 afeb, VSS, sinus rhythm 2 O2 sats good on RA 3 UOP not recorded, weight close to preop- can stop lasix 4 normal renal fxn 5 Tx to SNF today   LOS: 8 days    John Giovanni PA-C Pager [phone] 11/27/2022  DC instructions reviewed with patient patient examined and medical record reviewed,agree with above note. Dahlia Byes 11/27/2022

## 2022-11-27 NOTE — Progress Notes (Signed)
Physical Therapy Treatment and Discharge Patient Details Name: Mercedes Adams MRN: [id number] DOB: Nov 28, 1956 Today's Date: 11/27/2022   History of Present Illness Pt is 66 year old presented to [facility] on  11/19/22 with chest pain. Pt underwent CABG x 3 on 11/21/22. PMH - CAD, recent NSTEMI, recent GI bleed, anxiety, arthritis, DM, HTN,    PT Comments    Pt made great progress towards her physical therapy goals during her inpatient stay. Ambulating 250 ft with a Rollator at a supervision level. Pt performed therapeutic exercises for cervical ROM, postural re-education, and strengthening (written instruction provided). Reviewed sternal precautions and activity recommendations. Plan for d/c to SNF today to return to modI prior to return home as pt has limited caregiver support. No further acute PT needs.     Recommendations for follow up therapy are one component of a multi-disciplinary discharge planning process, led by the attending physician.  Recommendations may be updated based on patient status, additional functional criteria and insurance authorization.  Follow Up Recommendations  Skilled nursing-short term rehab (<3 hours/day) Can patient physically be transported by private vehicle: Yes   Assistance Recommended at Discharge Intermittent Supervision/Assistance  Patient can return home with the following A little help with walking and/or transfers;A little help with bathing/dressing/bathroom;Assistance with cooking/housework;Assist for transportation;Help with stairs or ramp for entrance   Equipment Recommendations  Rollator (4 wheels)    Recommendations for Other Services       Precautions / Restrictions Precautions Precautions: Sternal;Fall Restrictions Weight Bearing Restrictions: No     Mobility  Bed Mobility               General bed mobility comments: OOB in chair    Transfers Overall transfer level: Needs assistance Equipment used: Rollator (4  wheels) Transfers: Sit to/from Stand Sit to Stand: Supervision           General transfer comment: Use of sternal pillow. Cues for wider BOS and pulling feet back    Ambulation/Gait Ambulation/Gait assistance: Supervision Gait Distance (Feet): 250 Feet Assistive device: Rollator (4 wheels) Gait Pattern/deviations: Step-through pattern, Decreased stride length Gait velocity: decreased     General Gait Details: Slow and steady pace, good posture   Stairs             Wheelchair Mobility    Modified Rankin (Stroke Patients Only)       Balance Overall balance assessment: Needs assistance Sitting-balance support: No upper extremity supported, Feet supported Sitting balance-Leahy Scale: Good     Standing balance support: Single extremity supported, During functional activity Standing balance-Leahy Scale: Fair                              Cognition Arousal/Alertness: Awake/alert Behavior During Therapy: WFL for tasks assessed/performed Overall Cognitive Status: Within Functional Limits for tasks assessed                                          Exercises General Exercises - Lower Extremity Hip ABduction/ADduction: Both, 10 reps, Standing Other Exercises Other Exercises: Standing: bilateral scapular shrugs x 5, unilateral scapular shrugs x 5 each, cervical rotation to R/L x 5 each    General Comments        Pertinent Vitals/Pain Pain Assessment Pain Assessment: No/denies pain    Home Living  Prior Function            PT Goals (current goals can now be found in the care plan section) Acute Rehab PT Goals Patient Stated Goal: go to rehab Potential to Achieve Goals: Good Progress towards PT goals: Progressing toward goals    Frequency    Min 2X/week      PT Plan Other (comment) (d/c acute PT)    Co-evaluation              AM-PAC PT "6 Clicks" Mobility   Outcome  Measure  Help needed turning from your back to your side while in a flat bed without using bedrails?: None Help needed moving from lying on your back to sitting on the side of a flat bed without using bedrails?: A Little Help needed moving to and from a bed to a chair (including a wheelchair)?: A Little Help needed standing up from a chair using your arms (e.g., wheelchair or bedside chair)?: A Little Help needed to walk in hospital room?: A Little Help needed climbing 3-5 steps with a railing? : A Little 6 Click Score: 19    End of Session   Activity Tolerance: Patient tolerated treatment well Patient left: in chair;with call bell/phone within reach Nurse Communication: Mobility status PT Visit Diagnosis: Other abnormalities of gait and mobility (R26.89)     Time: 5974-1638 PT Time Calculation (min) (ACUTE ONLY): 19 min  Charges:  $Therapeutic Activity: 8-22 mins                     Wyona Almas, PT, DPT Acute Rehabilitation Services Office [phone]    Deno Etienne 11/27/2022, 9:35 AM

## 2022-11-27 NOTE — TOC Transition Note (Signed)
Transition of Care [facility]) - CM/SW Discharge Note   Patient Details  Name: Mercedes Adams MRN: [id number] Date of Birth: [birth year]  Transition of Care [facility]) CM/SW Contact:  Bethann Berkshire, [place] Phone Number: 11/27/2022, 2:03 PM   Clinical Narrative:     Patient will DC to: [facility] Anticipated DC date: 11/27/22 Family notified: Sister Mercedes Adams Transport by: Family in private vehicle    Per MD patient ready for DC to [facility]. RN, patient, patient's family, and facility notified of DC. Discharge Summary and FL2 sent to facility. RN to call report prior to discharge ([phone]  Room 605). DC packet on chart. Ambulance transport requested for patient.   CSW will sign off for now as social work intervention is no longer needed. Please consult [place] again if new needs arise.   Final next level of care: [place] Barriers to Discharge: No Barriers Identified   Patient Goals and CMS Choice Patient states their goals for this hospitalization and ongoing recovery are:: back to normal, eventually back to work [employer].gov Compare Post Acute Care list provided to:: Patient Choice offered to / list presented to : Patient  Discharge Placement              Patient chooses bed at: [facility] [place] Patient to be transferred to facility by: Family Name of family member notified: Sister Mercedes Adams Patient and family notified of of transfer: 11/27/22  Discharge Plan and Services In-house Referral: Clinical Social Work   Post Acute Care Choice: [place]                               Social Determinants of Health (SDOH) Interventions     Readmission Risk Interventions     No data to display

## 2022-11-28 MED FILL — Potassium Chloride Inj 2 mEq/ML: INTRAVENOUS | Qty: 40 | Status: AC

## 2022-11-28 MED FILL — Sodium Bicarbonate IV Soln 8.4%: INTRAVENOUS | Qty: 50 | Status: AC

## 2022-11-28 MED FILL — Albumin, Human Inj 5%: INTRAVENOUS | Qty: 250 | Status: AC

## 2022-11-28 MED FILL — Heparin Sodium (Porcine) Inj 1000 Unit/ML: INTRAMUSCULAR | Qty: 20 | Status: AC

## 2022-11-28 MED FILL — Lidocaine HCl Local Soln Prefilled Syringe 100 MG/5ML (2%): INTRAMUSCULAR | Qty: 10 | Status: AC

## 2022-11-28 MED FILL — Electrolyte-R (PH 7.4) Solution: INTRAVENOUS | Qty: 5000 | Status: AC

## 2022-11-28 MED FILL — Sodium Chloride IV Soln 0.9%: INTRAVENOUS | Qty: 2000 | Status: AC

## 2022-11-28 MED FILL — Heparin Sodium (Porcine) Inj 1000 Unit/ML: Qty: 1000 | Status: AC

## 2022-11-28 MED FILL — Magnesium Sulfate Inj 50%: INTRAMUSCULAR | Qty: 10 | Status: AC

## 2022-11-28 MED FILL — Mannitol IV Soln 20%: INTRAVENOUS | Qty: 500 | Status: AC

## 2022-12-05 ENCOUNTER — Other Ambulatory Visit: Payer: Self-pay | Admitting: *Deleted

## 2022-12-05 NOTE — Patient Outreach (Signed)
Mercedes Adams resides in [facility]. Screening for potential [facility] care coordination services as benefit of insurance plan and PCP.   Spoke with Janett Billow, SNF social worker. Mercedes Adams is slated to discharge home likely over the weekend. Lives with husband and has supportive sister.   Will plan outreach to discuss potential [facility] needs.   Marthenia Rolling, MSN, RN,BSN Man Acute Care Coordinator [phone] (Direct dial)

## 2022-12-10 ENCOUNTER — Ambulatory Visit: Payer: Medicare Other | Admitting: Internal Medicine

## 2022-12-10 ENCOUNTER — Encounter: Payer: Self-pay | Admitting: Internal Medicine

## 2022-12-10 VITALS — BP 121/75 | HR 72 | Ht 64.0 in | Wt 174.4 lb

## 2022-12-10 DIAGNOSIS — I214 Non-ST elevation (NSTEMI) myocardial infarction: Secondary | ICD-10-CM

## 2022-12-10 DIAGNOSIS — E119 Type 2 diabetes mellitus without complications: Secondary | ICD-10-CM

## 2022-12-10 DIAGNOSIS — I10 Essential (primary) hypertension: Secondary | ICD-10-CM

## 2022-12-10 NOTE — Progress Notes (Signed)
Primary Physician/Referring:  Leonides Sake, MD  Patient ID: Mercedes Adams, female    DOB: 1956-02-10, 66 y.o.   MRN: [id number]  Chief Complaint  Patient presents with  . nstemi  . Hospitalization Follow-up   HPI:    Mercedes Adams  is a 66 y.o. female female with past medical history significant for diabetes, hypertension, and recent NSTEMI who is here for hospital follow-up.  About 2 weeks ago patient was admitted to the hospital with chest pain, she was found to have NSTEMI.  Cardiac catheterization showed triple-vessel disease.  Her hemoglobin was noted to be quite low, concern was for GI bleed.  Patient ended up getting scoped during her hospitalization which showed a bleeding duodenal ulcer, this was fixed by GI.  Decision was made that patient would undergo CABG evaluation with cardiothoracic surgery as an outpatient since she did not have much angina during her hospital stay.  Today she is presenting with unstable angina in the office.  Patient has had to take many sublingual nitro's over the weekend and this morning.  She is currently having chest pain at rest that radiates to her neck, jaw, and left arm.  Patient would prefer to go to the hospital and be evaluated by cardiothoracic surgery there since she has never had angina this severe.  Past Medical History:  Diagnosis Date  . Anxiety   . Arthritis   . Diabetes mellitus without complication ([place] [facility] [place])   . Heart murmur   . Hyperlipidemia   . Hypertension   . Thyroid disease    Past Surgical History:  Procedure Laterality Date  . APPENDECTOMY    . BIOPSY  11/02/2022   Procedure: BIOPSY;  Surgeon: Jackquline Denmark, MD;  Location: [place];  Service: Gastroenterology;;  . CESAREAN SECTION     x 2  . CORONARY ARTERY BYPASS GRAFT N/A 11/21/2022   Procedure: CORONARY ARTERY BYPASS GRAFTING (CABG) X 3, USING LEFT INTERNAL MAMMARY ARTERY AND ENDOSCOPICALLY HARVESTED RIGHT GREATER SAPHENOUS VEIN.;  Surgeon: Dahlia Byes, MD;   Location: [place];  Service: Open Heart Surgery;  Laterality: N/A;  . ESOPHAGOGASTRODUODENOSCOPY (EGD) WITH PROPOFOL N/A 11/02/2022   Procedure: ESOPHAGOGASTRODUODENOSCOPY (EGD) WITH PROPOFOL;  Surgeon: Jackquline Denmark, MD;  Location: [place];  Service: Gastroenterology;  Laterality: N/A;  . HEMOSTASIS CLIP PLACEMENT  11/02/2022   Procedure: HEMOSTASIS CLIP PLACEMENT;  Surgeon: Jackquline Denmark, MD;  Location: [facility] ENDOSCOPY;  Service: Gastroenterology;;  . HEMOSTASIS CONTROL  11/02/2022   Procedure: HEMOSTASIS CONTROL;  Surgeon: Jackquline Denmark, MD;  Location: [place];  Service: Gastroenterology;;  . LEFT HEART CATH AND CORONARY ANGIOGRAPHY N/A 11/01/2022   Procedure: LEFT HEART CATH AND CORONARY ANGIOGRAPHY;  Surgeon: Adrian Prows, MD;  Location: [place] CV LAB;  Service: Cardiovascular;  Laterality: N/A;  . REPLACEMENT TOTAL KNEE     left knee  . SUBMUCOSAL INJECTION  11/02/2022   Procedure: SUBMUCOSAL INJECTION;  Surgeon: Jackquline Denmark, MD;  Location: [facility] ENDOSCOPY;  Service: Gastroenterology;;  . TEE WITHOUT CARDIOVERSION N/A 11/21/2022   Procedure: TRANSESOPHAGEAL ECHOCARDIOGRAM (TEE);  Surgeon: Dahlia Byes, MD;  Location: [place];  Service: Open Heart Surgery;  Laterality: N/A;   Family History  Problem Relation Age of Onset  . Hypertension Mother   . High Cholesterol Mother   . Hypertension Father   . High Cholesterol Father   . Heart failure Father     Social History   Tobacco Use  . Smoking status: Never  . Smokeless tobacco: Never  Substance Use  Topics  . Alcohol use: No   Marital Status: Married  ROS  Review of Systems  Cardiovascular:  Positive for chest pain, dyspnea on exertion and palpitations.   Objective  Blood pressure 121/75, pulse 72, height '5\' 4"'$  (1.626 m), weight 174 lb 6.4 oz (79.1 kg), SpO2 98 %. Body mass index is 29.94 kg/m.     12/10/2022    9:41 AM 11/27/2022    9:34 AM 11/27/2022    7:27 AM  Vitals with BMI  Height '5\' 4"'$     Weight 174 lbs 6 oz     BMI 21.30    Systolic 865  784  Diastolic 75  67  Pulse 72 86 87     Physical Exam Vitals reviewed.  HENT:     Head: Normocephalic and atraumatic.  Cardiovascular:     Rate and Rhythm: Normal rate and regular rhythm.     Pulses: Normal pulses.     Heart sounds: Normal heart sounds. No murmur heard. Pulmonary:     Effort: Pulmonary effort is normal.     Breath sounds: Normal breath sounds.  Abdominal:     General: Bowel sounds are normal.  Musculoskeletal:     Right lower leg: No edema.     Left lower leg: No edema.  Skin:    General: Skin is warm and dry.  Neurological:     Mental Status: She is alert.    Medications and allergies  No Known Allergies   Medication list after today's encounter   Current Outpatient Medications:  .  alum & mag hydroxide-simeth (MAALOX PLUS) 400-400-40 MG/5ML suspension, Take 10 mLs by mouth every 6 (six) hours as needed for indigestion., Disp: , Rfl:  .  amiodarone (PACERONE) 200 MG tablet, Take 1 tablet (200 mg total) by mouth every 12 (twelve) hours., Disp: , Rfl:  .  aspirin EC 325 MG tablet, Take 1 tablet (325 mg total) by mouth daily., Disp: , Rfl:  .  atorvastatin (LIPITOR) 80 MG tablet, Take 1 tablet (80 mg total) by mouth at bedtime., Disp: 90 tablet, Rfl: 0 .  fenofibrate 160 MG tablet, Take 1 tablet (160 mg total) by mouth daily., Disp: 90 tablet, Rfl: 0 .  JARDIANCE 25 MG TABS tablet, Take 25 mg by mouth daily., Disp: , Rfl:  .  levothyroxine (SYNTHROID, LEVOTHROID) 100 MCG tablet, Take 100 mcg by mouth daily before breakfast., Disp: , Rfl:  .  metFORMIN (GLUCOPHAGE) 1000 MG tablet, Take 2,000 mg by mouth in the morning and at bedtime., Disp: , Rfl:  .  metoprolol tartrate (LOPRESSOR) 50 MG tablet, Take 1 tablet (50 mg total) by mouth 2 (two) times daily., Disp: , Rfl:  .  Multiple Vitamin (MULTIVITAMIN) capsule, Take 1 capsule by mouth daily., Disp: , Rfl:  .  ondansetron (ZOFRAN) 4 MG tablet, Take 4 mg by mouth 3 (three) times  daily as needed for nausea or vomiting., Disp: , Rfl:  .  oxyCODONE (OXY IR/ROXICODONE) 5 MG immediate release tablet, Take 5 mg by mouth every 4 (four) hours as needed for severe pain., Disp: , Rfl:  .  pantoprazole (PROTONIX) 40 MG tablet, Take 1 tablet (40 mg total) by mouth daily., Disp: 30 tablet, Rfl: 2 .  docusate sodium (COLACE) 100 MG capsule, Take 1 capsule (100 mg total) by mouth 2 (two) times daily., Disp: 10 capsule, Rfl: 0 .  Fe Fum-Vit C-Vit B12-FA (TRIGELS-F FORTE) CAPS capsule, Take 1 capsule by mouth daily after breakfast. May substitute near  equivalent, Disp: , Rfl: 0  Laboratory examination:   Lab Results  Component Value Date   NA 140 11/26/2022   K 4.2 11/26/2022   CO2 26 11/26/2022   GLUCOSE 140 (H) 11/26/2022   BUN 11 11/26/2022   CREATININE 0.81 11/26/2022   CALCIUM 9.2 11/26/2022   GFRNONAA >60 11/26/2022       Latest Ref Rng & Units 11/26/2022   12:13 AM 11/25/2022    1:39 AM 11/25/2022   12:25 AM  CMP  Glucose 70 - 99 mg/dL 140  119  126   BUN 8 - 23 mg/dL '11  11  12   '$ Creatinine 8.50 - 1.00 mg/dL 0.81  0.78  0.75   Sodium 135 - 145 mmol/L 140  140  141   Potassium 3.5 - 5.1 mmol/L 4.2  4.0  6.0   Chloride 98 - 111 mmol/L 106  108  108   CO2 22 - 32 mmol/L '26  26  22   '$ Calcium 8.9 - 10.3 mg/dL 9.2  8.2  5.5       Latest Ref Rng & Units 11/25/2022   12:25 AM 11/24/2022    4:41 AM 11/23/2022    4:18 AM  CBC  WBC 4.0 - 10.5 K/uL 10.4  10.3  11.4   Hemoglobin 12.0 - 15.0 g/dL 8.6  7.7  7.7   Hematocrit 36.0 - 46.0 % 27.1  25.2  25.4   Platelets 150 - 400 K/uL 148  155  129     Lipid Panel Recent Labs    11/02/22 0758  CHOL 137  TRIG 179*  LDLCALC 70  VLDL 36  HDL 31*  CHOLHDL 4.4  LDLDIRECT 79     HEMOGLOBIN A1C Lab Results  Component Value Date   HGBA1C 8.0 (H) 11/01/2022   MPG 182.9 11/01/2022   TSH Recent Labs    11/01/22 1028 11/19/22 1335 11/25/22 1000  TSH 8.243* 2.254 1.298     External labs:      Radiology:    Cardiac Studies:   ECHO COMPLETE WITH IMAGING ENHANCING AGENT 11/02/2022  1. Definity was used for evaluation of wallmotion and EF estimation. Left ventricular ejection fraction, by estimation, is 50 to 55%. Left ventricular ejection fraction by 2D MOD biplane is 54.7 %. The left ventricle has low normal function. The left ventricle demonstrates regional wall motion abnormalities (see scoring diagram/findings for description). There is mild left ventricular hypertrophy. Left ventricular diastolic parameters are consistent with Grade II diastolic dysfunction (pseudonormalization). Elevated left ventricular end-diastolic pressure. There is mild hypokinesis of the left ventricular, entire inferior wall. 2. Right ventricular systolic function is normal. The right ventricular size is normal. There is normal pulmonary artery systolic pressure. The estimated right ventricular systolic pressure is 27.7 mmHg. 3. Left atrial size was mildly dilated. 4. The mitral valve is normal in structure. Mild to moderate mitral valve regurgitation. 5. The aortic valve was not well visualized. Aortic valve regurgitation is not visualized. Aortic valve sclerosis/calcification is present, without any evidence of aortic stenosis. 6. The inferior vena cava is normal in size with greater than 50% respiratory variability, suggesting right atrial pressure of 3 mmHg.         11/01/2022:    Impression: Severe multivessel coronary artery disease.  Moderate to severely reduced LVEF, EF anywhere from 30 to 35%, would ideally be a CABG candidate especially in view of GI bleed and difficulty in dual antiplatelet therapy long-term.  However for now we will manage  her medically, obtain the CTS consult electively, do not think that the LAD is bypassable in view of severe disease.  Large RCA with large PDA and PL branch, fairly moderate-sized D2 and large CX are bypassable.   EKG:   11/19/2022: Sinus Rhythm  -Old inferior infarct with true posterior extension of inferior MI.   Assessment     ICD-10-CM   1. NSTEMI (non-ST elevated myocardial infarction) ([place])  I21.4 EKG 12-Lead        Orders Placed This Encounter  Procedures  . EKG 12-Lead    No orders of the defined types were placed in this encounter.   There are no discontinued medications.    Recommendations:   Mercedes Adams is a 66 y.o.  female with active chest pain   NSTEMI (non-ST elevated myocardial infarction) [facility]) Patient being evaluated for CABG   Essential hypertension Continue current cardiac medications. Encourage low-sodium diet, less than 2000 mg daily.   Mixed hyperlipidemia Continue statin   Type 2 diabetes mellitus without complication, without long-term current use of insulin ([place]) Managed by primary   Unstable angina [facility]) Direct admit to [facility], cardiology service Will check hgb and reach out to CVTS Patient started on nitro and heparin gtt     Mercedes Flock, DO, [facility]  12/10/2022, 10:03 AM Office: [phone] Pager: [phone]

## 2022-12-11 ENCOUNTER — Telehealth (HOSPITAL_COMMUNITY): Payer: Self-pay

## 2022-12-11 NOTE — Telephone Encounter (Signed)
Phase II referral for Cardiac Rehab faxed to [place]. 

## 2022-12-12 ENCOUNTER — Telehealth: Payer: Self-pay | Admitting: Gastroenterology

## 2022-12-12 NOTE — Telephone Encounter (Signed)
Inbound call from patient sister stating patient is having bad GI symtoms, she is scheduled to see Dr.Gupta on 1/23 but has been calling frequently to see if there are any cancellations. Please advise.

## 2022-12-12 NOTE — Telephone Encounter (Signed)
Pt is requesting a sooner appointment due to ongoing symptoms/ abdominal pain: Pt was scheduled to see Nicoletta Ba PA  on 12/26/2023 at 1:30 PM: Pt made aware: Pt verbalized understanding with all questions answered.

## 2022-12-14 ENCOUNTER — Other Ambulatory Visit: Payer: Self-pay

## 2022-12-14 ENCOUNTER — Telehealth: Payer: Self-pay | Admitting: Gastroenterology

## 2022-12-14 MED ORDER — ONDANSETRON HCL 4 MG PO TABS: 4.0000 mg | ORAL_TABLET | Freq: Three times a day (TID) | ORAL | 1 refills | Status: AC | PRN

## 2022-12-14 NOTE — Telephone Encounter (Signed)
Pt sister called in stating that the pt only has 2 pills left of the Zofran and Is requesting a prescription for the Zofran to last her at least to her GI appointment: Pt still having GI issues and nausea:  Pt has office visit with Nicoletta Ba PA on 12/25/2022: Please advise

## 2022-12-14 NOTE — Telephone Encounter (Signed)
Inbound call from patients sister requesting a refill for Zofran for patient. [facility] on [address] in [place]. Please advise.

## 2022-12-17 NOTE — Telephone Encounter (Signed)
Please call in Zofran 4 mg ODT every 8 hours as needed #20 RG

## 2022-12-18 ENCOUNTER — Other Ambulatory Visit: Payer: Self-pay

## 2022-12-18 NOTE — Telephone Encounter (Signed)
Pt chart reviewed. Pt had prescription sent in from [facility]  Abbott Pao contacted: Pt stated that her PCP sent in the prescription for the Zofran  Pt was reminded of appointment on 12/25/2022 at 1:30 PM:  Pt verbalized understanding with all questions answered.

## 2022-12-25 ENCOUNTER — Encounter: Payer: Self-pay | Admitting: Physician Assistant

## 2022-12-25 ENCOUNTER — Ambulatory Visit (INDEPENDENT_AMBULATORY_CARE_PROVIDER_SITE_OTHER): Payer: Medicare Other | Admitting: Physician Assistant

## 2022-12-25 ENCOUNTER — Other Ambulatory Visit (INDEPENDENT_AMBULATORY_CARE_PROVIDER_SITE_OTHER): Payer: Medicare Other

## 2022-12-25 VITALS — BP 118/68 | HR 63 | Ht 63.0 in | Wt 170.0 lb

## 2022-12-25 DIAGNOSIS — K269 Duodenal ulcer, unspecified as acute or chronic, without hemorrhage or perforation: Secondary | ICD-10-CM | POA: Diagnosis not present

## 2022-12-25 DIAGNOSIS — K922 Gastrointestinal hemorrhage, unspecified: Secondary | ICD-10-CM | POA: Diagnosis not present

## 2022-12-25 DIAGNOSIS — I200 Unstable angina: Secondary | ICD-10-CM | POA: Diagnosis not present

## 2022-12-25 LAB — CBC WITH DIFFERENTIAL/PLATELET
Basophils Absolute: 0 10*3/uL (ref 0.0–0.1)
Basophils Relative: 0.3 % (ref 0.0–3.0)
Eosinophils Absolute: 0.8 10*3/uL — ABNORMAL HIGH (ref 0.0–0.7)
Eosinophils Relative: 8.5 % — ABNORMAL HIGH (ref 0.0–5.0)
HCT: 35.8 % — ABNORMAL LOW (ref 36.0–46.0)
Hemoglobin: 11.5 g/dL — ABNORMAL LOW (ref 12.0–15.0)
Lymphocytes Relative: 17.1 % (ref 12.0–46.0)
Lymphs Abs: 1.7 10*3/uL (ref 0.7–4.0)
MCHC: 32.1 g/dL (ref 30.0–36.0)
MCV: 82 fl (ref 78.0–100.0)
Monocytes Absolute: 1 10*3/uL (ref 0.1–1.0)
Monocytes Relative: 10.3 % (ref 3.0–12.0)
Neutro Abs: 6.2 10*3/uL (ref 1.4–7.7)
Neutrophils Relative %: 63.8 % (ref 43.0–77.0)
Platelets: 340 10*3/uL (ref 150.0–400.0)
RBC: 4.37 Mil/uL (ref 3.87–5.11)
RDW: 17.3 % — ABNORMAL HIGH (ref 11.5–15.5)
WBC: 9.8 10*3/uL (ref 4.0–10.5)

## 2022-12-25 LAB — IBC + FERRITIN
Ferritin: 13.6 ng/mL (ref 10.0–291.0)
Iron: 31 ug/dL — ABNORMAL LOW (ref 42–145)
Saturation Ratios: 5.9 % — ABNORMAL LOW (ref 20.0–50.0)
TIBC: 522.2 ug/dL — ABNORMAL HIGH (ref 250.0–450.0)
Transferrin: 373 mg/dL — ABNORMAL HIGH (ref 212.0–360.0)

## 2022-12-25 MED ORDER — PANTOPRAZOLE SODIUM 40 MG PO TBEC: DELAYED_RELEASE_TABLET | ORAL | 0 refills | Status: DC

## 2022-12-25 MED ORDER — PANTOPRAZOLE SODIUM 40 MG PO TBEC: 40.0000 mg | DELAYED_RELEASE_TABLET | Freq: Every day | ORAL | 11 refills | Status: DC

## 2022-12-25 NOTE — Patient Instructions (Signed)
_______________________________________________________  If you are age 67 or older, your body mass index should be between 23-30. Your Body mass index is 30.11 kg/m. If this is out of the aforementioned range listed, please consider follow up with your Primary Care Provider.  If you are age 81 or younger, your body mass index should be between 19-25. Your Body mass index is 30.11 kg/m. If this is out of the aformentioned range listed, please consider follow up with your Primary Care Provider.   ________________________________________________________  The [place] [facility] providers would like to encourage you to use [facility] to communicate with providers for non-urgent requests or questions.  Due to long hold times on the telephone, sending your provider a message by [facility] may be a faster and more efficient way to get a response.  Please allow 48 business hours for a response.  Please remember that this is for non-urgent requests.  _______________________________________________________  Your provider has requested that you go to the basement level for lab work before leaving today. Press "B" on the elevator. The lab is located at the first door on the left as you exit the elevator.  Continue Protonix 40 mg twice daily for 1 month , Then decrease to once daily before breakfast.  Follow up in 1 year or sooner if needed  You will be due for a recall colonoscopy in 2025. We will send you a reminder in the mail when it gets closer to that time.   Due to recent changes in healthcare laws, you may see the results of your imaging and laboratory studies on MyChart before your provider has had a chance to review them.  We understand that in some cases there may be results that are confusing or concerning to you. Not all laboratory results come back in the same time frame and the provider may be waiting for multiple results in order to interpret others.  Please give [place] 48 hours in order for your provider to  thoroughly review all the results before contacting the office for clarification of your results.

## 2022-12-25 NOTE — Progress Notes (Signed)
Subjective:    Patient ID: Mercedes Adams, female    DOB: January 09, 1956, 66 y.o.   MRN: [id number]  HPI Kingston is a pleasant 66 year old white female, established with Dr. Havery Moros, who comes in today for follow-up after recent hospitalization with an acute upper GI bleed. She was admitted on 11/01/2022 with complaint of chest pain in setting of known hypertension, non-insulin-dependent diabetes, hyperlipidemia and obesity.  She was diagnosed with an N ST EMI.  She was then found to have progressive anemia and melenic stools, hemoglobin 6.4 and underwent EGD the following day on 11/02/2022 per Dr. Lyndel Safe with finding of a small hiatal hernia, diffuse gastric erythema in the antrum and then 1 deep cratered duodenal ulcer with visible vessel measuring about 12 mm in size this was injected, treated with Endo Clip x 2 and pure stat.  She was placed on PPI infusion and then twice daily PPI thereafter.  Biopsies were negative for H. pylori. After she was discharged on 11/ 6/23 she required readmitted on 11/19/2022 with current chest pain/unstable angina and ultimately underwent CABG x 3 11/21/2022. She has been recuperating at home over the past several weeks.  Has not been released from a surgical standpoint as yet has follow-up with them later this week.  She says overall she is feeling okay and feels like she has regained some of her strength.  She still gets short of breath with exertion, has not had any chest pain.  She has no complaints of abdominal pain and no further melena or hematochezia.  Her appetite was not good after surgery and she has had some occasional nausea.  She feels that her appetite gradually is improving.  No heartburn or indigestion. She is currently on full dose aspirin 325 daily and continues on Protonix 40 mg twice daily.  Last hemoglobin on 11/25/2022 was 8.6 Iron studies prior to transfusions and IV iron on 11/01/2022 showed ferritin of 16 serum iron 34 IBC 400 and iron sat of  9.  She also has prior history of duodenal ulcer documented in 2018 on EGD and had colonoscopy per Dr. Fuller Plan in 2018 with finding of 2 6 to 7 mm sessile polyps which were tubular adenomas multiple diverticuli and grade 1 internal hemorrhoids.  Biopsies consistent with tubular adenomas and indicated for follow-up November 2025. Review of Systems Pertinent positive and negative review of systems were noted in the above HPI section.  All other review of systems was otherwise negative.   Outpatient Encounter Medications as of 12/25/2022  Medication Sig   amiodarone (PACERONE) 200 MG tablet Take 1 tablet (200 mg total) by mouth every 12 (twelve) hours.   aspirin EC 325 MG tablet Take 1 tablet (325 mg total) by mouth daily.   Fe Fum-Vit C-Vit B12-FA (TRIGELS-F FORTE) CAPS capsule Take 1 capsule by mouth daily after breakfast. May substitute near equivalent   fenofibrate 160 MG tablet Take 1 tablet (160 mg total) by mouth daily.   folic acid (FOLVITE) 1 MG tablet Take 1 mg by mouth daily.   JARDIANCE 25 MG TABS tablet Take 25 mg by mouth daily.   levothyroxine (SYNTHROID, LEVOTHROID) 100 MCG tablet Take 100 mcg by mouth daily before breakfast.   metFORMIN (GLUCOPHAGE) 1000 MG tablet Take 2,000 mg by mouth in the morning and at bedtime.   metoprolol tartrate (LOPRESSOR) 50 MG tablet Take 1 tablet (50 mg total) by mouth 2 (two) times daily.   Multiple Vitamin (MULTIVITAMIN) capsule Take 1 capsule by mouth daily.  ondansetron (ZOFRAN) 4 MG tablet Take 1 tablet (4 mg total) by mouth 3 (three) times daily as needed for nausea or vomiting.   oxyCODONE (OXY IR/ROXICODONE) 5 MG immediate release tablet Take 5 mg by mouth every 4 (four) hours as needed for severe pain.   pantoprazole (PROTONIX) 40 MG tablet Take 1 tablet (40 mg total) by mouth daily.   [DISCONTINUED] pantoprazole (PROTONIX) 40 MG tablet Take 1 tablet (40 mg total) by mouth daily.   alum & mag hydroxide-simeth (MAALOX PLUS) 400-400-40 MG/5ML  suspension Take 10 mLs by mouth every 6 (six) hours as needed for indigestion. (Patient not taking: Reported on 12/25/2022)   atorvastatin (LIPITOR) 80 MG tablet Take 1 tablet (80 mg total) by mouth at bedtime. (Patient not taking: Reported on 12/25/2022)   docusate sodium (COLACE) 100 MG capsule Take 1 capsule (100 mg total) by mouth 2 (two) times daily. (Patient not taking: Reported on 12/25/2022)   pantoprazole (PROTONIX) 40 MG tablet Twice a day for 1 month then decrease to once daily before breakfast.   No facility-administered encounter medications on file as of 12/25/2022.   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   Unstable angina ([place]) 11/19/2022   Hypertriglyceridemia 11/03/2022   Acute GI bleeding 11/02/2022   Duodenal ulcer 11/02/2022   Acute heart failure with preserved ejection fraction (HFpEF) ([place]) 11/02/2022   Acute blood loss anemia 11/02/2022   Class 1 obesity due to excess calories with serious comorbidity and body mass index (BMI) of 32.0 to 32.9 in adult 11/02/2022   NSTEMI (non-ST elevated myocardial infarction) ([place]) 11/01/2022   DM (diabetes mellitus) ([place]) 11/01/2022   Essential hypertension 11/01/2022   Mixed hyperlipidemia 11/01/2022   Coronary artery disease involving native coronary artery of native heart without angina pectoris 11/01/2022   Social History   Socioeconomic History   Marital status: Married    Spouse name: Not on file   Number of children: 2   Years of education: Not on file   Highest education level: Not on file  Occupational History   Not on file  Tobacco Use   Smoking status: Never   Smokeless tobacco: Never  Vaping Use   Vaping Use: Never used  Substance and Sexual Activity   Alcohol use: No   Drug use: No   Sexual activity: Not on file  Other Topics Concern   Not on file  Social History Narrative   Not on file   Social Determinants of Health   Financial Resource  Strain: Not on file  Food Insecurity: Not on file  Transportation Needs: Not on file  Physical Activity: Not on file  Stress: Not on file  Social Connections: Not on file  Intimate Partner Violence: Not on file    Ms. Sicard's family history includes Heart failure in her father; High Cholesterol in her father and mother; Hypertension in her father and mother.      Objective:    Vitals:   12/25/22 1319  BP: 118/68  Pulse: 63  SpO2: 98%    Physical Exam Well-developed well-nourished older white female in no acute distress.  Height, Weight 170, BMI 30.1  HEENT; nontraumatic normocephalic, EOMI, PE R LA, sclera anicteric. Oropharynx; not examined today Neck; supple, no JVD Cardiovascular; regular rate and rhythm with S1-S2, no murmur rub or gallop, healed sternal incisional scar not done today Pulmonary; Clear bilaterally Abdomen; soft, nontender, nondistended, no palpable mass or hepatosplenomegaly,  bowel sounds are active Rectal; Skin; benign exam, no jaundice rash or appreciable lesions Extremities; no clubbing cyanosis or edema skin warm and dry Neuro/Psych; alert and oriented x4, grossly nonfocal mood and affect appropriate        Assessment & Plan:   #28 66 year old white female here for follow-up after admission early November 2023 when she presented with chest pain, was diagnosed with an NSTEMI-so found to be quite anemic with hemoglobin of 6.4 and then had melena. EGD the following day with finding of 1 deep cratered duodenal ulcer 12 mm, with visible vessel treated with injection, Endo clipping x 2 and pure stat gel for hemostasis. Biopsy negative for H. Pylori  She continues on twice daily Protonix 40 mg Patient has been doing better from a GI standpoint, appetite poor post CABG and has had some nausea, no recurrent melena or hematochezia, appetite improving  Patient also has prior history of duodenal ulcer 2018  #2 readmission-11/19/2022 with chest pain/unstable  angina and underwent CABG x 3 Now on aspirin 325 daily  #3 anemia, secondary to acute blood loss and also component of iron deficiency #4 hypertension #5.  Adult onset diabetes mellitus  #6 history of adenomatous colon polyps-due for follow-up colonoscopy November 2025  Plan; plan to continue Protonix 40 mg p.o. twice daily x 3-4 more weeks and then switch to Protonix 40 mg p.o. every morning AC breakfast indefinitely. Avoid NSAIDs, advised  to take  aspirin with food Will check CBC today and iron studies, expect she will need some iron replacement. Follow-up colonoscopy will be due fall 2025 Further recommendations pending results of today's labs.  Ellese Julius S Ithan Touhey PA-C 12/25/2022   Cc: [name], Lorin Mercy, MD

## 2022-12-26 NOTE — Progress Notes (Signed)
Agree with assessment and plan as outlined.  

## 2022-12-27 ENCOUNTER — Ambulatory Visit
Admission: RE | Admit: 2022-12-27 | Discharge: 2022-12-27 | Disposition: A | Discharge status: Home / Self Care | Payer: Medicare Other | Source: Ambulatory Visit | Attending: Cardiothoracic Surgery | Admitting: Cardiothoracic Surgery

## 2022-12-27 ENCOUNTER — Other Ambulatory Visit: Payer: Self-pay | Admitting: Physician Assistant

## 2022-12-27 ENCOUNTER — Ambulatory Visit (INDEPENDENT_AMBULATORY_CARE_PROVIDER_SITE_OTHER): Payer: Self-pay | Admitting: Physician Assistant

## 2022-12-27 ENCOUNTER — Other Ambulatory Visit: Payer: Self-pay | Admitting: Cardiothoracic Surgery

## 2022-12-27 VITALS — BP 111/69 | HR 73 | Resp 20 | Ht 63.0 in | Wt 170.0 lb

## 2022-12-27 DIAGNOSIS — I2510 Atherosclerotic heart disease of native coronary artery without angina pectoris: Secondary | ICD-10-CM

## 2022-12-27 DIAGNOSIS — Z951 Presence of aortocoronary bypass graft: Secondary | ICD-10-CM

## 2022-12-27 DIAGNOSIS — I214 Non-ST elevation (NSTEMI) myocardial infarction: Secondary | ICD-10-CM

## 2022-12-27 MED ORDER — AMIODARONE HCL 200 MG PO TABS: 200.0000 mg | ORAL_TABLET | Freq: Every day | ORAL | Status: DC

## 2022-12-27 NOTE — Progress Notes (Signed)
[address]       [place] [address]             [phone]       History of Present Illness: [name] is a 66 year old female with a past history of type 2 diabetes mellitus, hypertension, and coronary artery disease.  He was initially seen by Dr. Roxan Hockey on 11/04/2022 after she presented with a non-ST elevation myocardial infarction.  Workup included left heart catheterization demonstrating severe three-vessel coronary artery disease but the patient was also noted to be profoundly anemic.  After GI workup, she was confirmed to have a duodenal ulcer.  She was discharged after being transfused and stabilized with plans to allow time l her to recover from the duodenal ulcer before proceeding with coronary bypass grafting.  However, she was admitted 11/19/2022 with recurrent chest pain consistent with unstable angina.  At that point, her hemoglobin and hematocrit were felt to be stable and she had no further evidence of active GI bleeding.  She was evaluated by Dr. Darcey Nora and recommended proceeding with coronary bypass grafting.  She had CABG x 3 on 11/21/2022.  Postoperatively, she had an successfully converted back to sinus rhythm on amiodarone.  She had no evidence of any additional GI bleeding in the postoperative phase. [name] returns today for scheduled follow-up.  She feels she has continued to progress since discharge.  She denies having any further chest pain and no evidence of recurrent GI bleeding.  She also denies having any palpitations.  She does report having a tingling sensation in her left elbow and left hand that has been present since surgery.   Current Outpatient Medications  Medication Sig Dispense Refill   alum & mag hydroxide-simeth (MAALOX PLUS) 400-400-40 MG/5ML suspension Take 10 mLs by mouth every 6 (six) hours as needed for indigestion. (Patient not taking: Reported on 12/25/2022)     amiodarone (PACERONE) 200 MG tablet Take 1 tablet  (200 mg total) by mouth every 12 (twelve) hours.     aspirin EC 325 MG tablet Take 1 tablet (325 mg total) by mouth daily.     atorvastatin (LIPITOR) 80 MG tablet Take 1 tablet (80 mg total) by mouth at bedtime. (Patient not taking: Reported on 12/25/2022) 90 tablet 0   docusate sodium (COLACE) 100 MG capsule Take 1 capsule (100 mg total) by mouth 2 (two) times daily. (Patient not taking: Reported on 12/25/2022) 10 capsule 0   Fe Fum-Vit C-Vit B12-FA (TRIGELS-F FORTE) CAPS capsule Take 1 capsule by mouth daily after breakfast. May substitute near equivalent  0   fenofibrate 160 MG tablet Take 1 tablet (160 mg total) by mouth daily. 90 tablet 0   folic acid (FOLVITE) 1 MG tablet Take 1 mg by mouth daily.     JARDIANCE 25 MG TABS tablet Take 25 mg by mouth daily.     levothyroxine (SYNTHROID, LEVOTHROID) 100 MCG tablet Take 100 mcg by mouth daily before breakfast.     metFORMIN (GLUCOPHAGE) 1000 MG tablet Take 2,000 mg by mouth in the morning and at bedtime.     metoprolol tartrate (LOPRESSOR) 50 MG tablet Take 1 tablet (50 mg total) by mouth 2 (two) times daily.     Multiple Vitamin (MULTIVITAMIN) capsule Take 1 capsule by mouth daily.     ondansetron (ZOFRAN) 4 MG tablet Take 1 tablet (4 mg total) by mouth 3 (three) times daily as needed for nausea or vomiting. 30 tablet 1  oxyCODONE (OXY IR/ROXICODONE) 5 MG immediate release tablet Take 5 mg by mouth every 4 (four) hours as needed for severe pain.     pantoprazole (PROTONIX) 40 MG tablet Twice a day for 1 month then decrease to once daily before breakfast. 60 tablet 0   pantoprazole (PROTONIX) 40 MG tablet Take 1 tablet (40 mg total) by mouth daily. 30 tablet 11   No current facility-administered medications for this visit.    Physical Exam  Vital signs BP 111/69 Pulse 73 Respirations 20 SpO2 96% on room air   Diagnostic Tests: CLINICAL DATA:  Post CABG, surgery in November of 2023.   EXAM: CHEST - 2 VIEW   COMPARISON:  November 24, 2022.   FINDINGS: Post median sternotomy for CABG. Cardiomediastinal contours and hilar structures is are stable. Linear bandlike airspace opacities arise from the LEFT hilum. No lobar consolidative process. Mild blunting of LEFT costodiaphragmatic sulcus. No pneumothorax.   On limited assessment no acute skeletal process.   IMPRESSION: 1. Post median sternotomy for CABG. 2. Linear bandlike airspace opacities arise from the LEFT hilum, likely atelectasis. 3. Question trace LEFT effusion.     Electronically Signed   By: Zetta Bills M.D.   On: 12/27/2022 13:18  Impression / Plan:  Continued progress following CABG x 2.  She has been taking her medications as prescribed.  She is in a regular cardiac rhythm today denies having any episodes of palpitations or shortness of breath since her discharge.  I will decrease her amiodarone to 200 mg once daily. She is scheduled for the initial evaluation with cardiac rehab in about 2 weeks and plans to participate in that program.  She may resume driving at this point but should continue to observe sternal precautions as instructed.  We will plan to follow-up with her in 1 month and if her cardiac rhythm remains stable we may be able to discontinue the amiodarone at that time.  Antony Odea, PA-C [facility] [phone]

## 2022-12-27 NOTE — Patient Instructions (Signed)
You may decrease the amiodarone to 200 mg once daily.  Continue to observe sternal precautions with no lifting more than 15 pounds for another 4 weeks.  You may resume activities without restrictions  You may resume driving.

## 2023-01-07 ENCOUNTER — Ambulatory Visit: Payer: Medicare Other | Admitting: Cardiothoracic Surgery

## 2023-01-22 ENCOUNTER — Ambulatory Visit: Payer: Medicare Other | Admitting: Gastroenterology

## 2023-01-22 ENCOUNTER — Other Ambulatory Visit (INDEPENDENT_AMBULATORY_CARE_PROVIDER_SITE_OTHER): Payer: Medicare Other

## 2023-01-22 ENCOUNTER — Encounter: Payer: Self-pay | Admitting: Gastroenterology

## 2023-01-22 ENCOUNTER — Ambulatory Visit (INDEPENDENT_AMBULATORY_CARE_PROVIDER_SITE_OTHER): Payer: Medicare Other | Admitting: Gastroenterology

## 2023-01-22 VITALS — BP 124/70 | HR 56 | Ht 60.0 in | Wt 175.0 lb

## 2023-01-22 DIAGNOSIS — K922 Gastrointestinal hemorrhage, unspecified: Secondary | ICD-10-CM

## 2023-01-22 DIAGNOSIS — Z86010 Personal history of colonic polyps: Secondary | ICD-10-CM | POA: Diagnosis not present

## 2023-01-22 LAB — COMPREHENSIVE METABOLIC PANEL
ALT: 24 U/L (ref 0–35)
AST: 14 U/L (ref 0–37)
Albumin: 4 g/dL (ref 3.5–5.2)
Alkaline Phosphatase: 79 U/L (ref 39–117)
BUN: 18 mg/dL (ref 6–23)
CO2: 25 mEq/L (ref 19–32)
Calcium: 9.2 mg/dL (ref 8.4–10.5)
Chloride: 106 mEq/L (ref 96–112)
Creatinine, Ser: 0.86 mg/dL (ref 0.40–1.20)
GFR: 69.79 mL/min (ref >60.00)
Glucose, Bld: 195 mg/dL — ABNORMAL HIGH (ref 70–99)
Potassium: 5.3 mEq/L — ABNORMAL HIGH (ref 3.5–5.1)
Sodium: 139 mEq/L (ref 135–145)
Total Bilirubin: 0.3 mg/dL (ref 0.2–1.2)
Total Protein: 7.2 g/dL (ref 6.0–8.3)

## 2023-01-22 LAB — CBC WITH DIFFERENTIAL/PLATELET
Basophils Absolute: 0.1 10*3/uL (ref 0.0–0.1)
Basophils Relative: 0.8 % (ref 0.0–3.0)
Eosinophils Absolute: 0.5 10*3/uL (ref 0.0–0.7)
Eosinophils Relative: 5.3 % — ABNORMAL HIGH (ref 0.0–5.0)
HCT: 36.4 % (ref 36.0–46.0)
Hemoglobin: 11.4 g/dL — ABNORMAL LOW (ref 12.0–15.0)
Lymphocytes Relative: 21.5 % (ref 12.0–46.0)
Lymphs Abs: 2 10*3/uL (ref 0.7–4.0)
MCHC: 31.3 g/dL (ref 30.0–36.0)
MCV: 81.3 fl (ref 78.0–100.0)
Monocytes Absolute: 0.8 10*3/uL (ref 0.1–1.0)
Monocytes Relative: 8.7 % (ref 3.0–12.0)
Neutro Abs: 5.9 10*3/uL (ref 1.4–7.7)
Neutrophils Relative %: 63.7 % (ref 43.0–77.0)
Platelets: 267 10*3/uL (ref 150.0–400.0)
RBC: 4.48 Mil/uL (ref 3.87–5.11)
RDW: 18.6 % — ABNORMAL HIGH (ref 11.5–15.5)
WBC: 9.3 10*3/uL (ref 4.0–10.5)

## 2023-01-22 NOTE — Patient Instructions (Addendum)
_______________________________________________________  If your blood pressure at your visit was 140/90 or greater, please contact your primary care physician to follow up on this.  _______________________________________________________  If you are age 68 or older, your body mass index should be between 23-30. Your Body mass index is 34.18 kg/m. If this is out of the aforementioned range listed, please consider follow up with your Primary Care Provider.  If you are age 29 or younger, your body mass index should be between 19-25. Your Body mass index is 34.18 kg/m. If this is out of the aformentioned range listed, please consider follow up with your Primary Care Provider.   ________________________________________________________  The [facility] providers would like to encourage you to use [facility] to communicate with providers for non-urgent requests or questions.  Due to long hold times on the telephone, sending your provider a message by [facility] may be a faster and more efficient way to get a response.  Please allow 48 business hours for a response.  Please remember that this is for non-urgent requests.  _______________________________________________________  Your provider has requested that you go to the basement level for lab work before leaving today. Press "B" on the elevator. The lab is located at the first door on the left as you exit the elevator.  Continue Protonix  Repeat colonoscopy for November 2025. Please call 2 months prior to schedule this. A letter will be sent as it gets closer.  Thank you,  Dr. Jackquline Denmark

## 2023-01-22 NOTE — Progress Notes (Signed)
Chief Complaint:   Referring Provider:  Leonides Sake, MD      ASSESSMENT AND PLAN;   #1. UGI bleed d/t DU (resolved)  #2. H/O polyps Nov 2018  Plan: -CBC, CMP -Continue protonix '40mg'$  po QD -FU colon Nov 2025   HPI:    Mercedes Adams is a 67 y.o. female  With multiple medical problems as below including DM 2, HLD, HTN, OA, anxiety, hypothyroidism, CAD s/p recent CABG  FU from hospitalization Nov 2023 with STEMI, UGI bleed d/t DU s/p endoscopic therapy.  Subsequently underwent CABG 11/19/2022.  Currently maintained on aspirin.   No further GI bleeding.  She is maintained on Protonix 40 mg p.o. daily  Doing very well from GI standpoint.  Anemia has almost resolved.  Most recent hemoglobin was 11.5 on 12/25/2022 (prev 8.05 November 2022).  No nausea, vomiting, heartburn, regurgitation, odynophagia or dysphagia.  No significant diarrhea or constipation.  No melena or hematochezia. No unintentional weight loss. No abdominal pain.  Previous GI procedures: EGD 11/02/2022: DU with VV s/p epi/clips/purestat. Neg HP Colonoscopy 10/2017: Colon polyp s/p polypectomy. Rpt 7 yrs.   Past Medical History:  Diagnosis Date   Anxiety    Arthritis    Diabetes mellitus without complication ([place])    Heart murmur    Hyperlipidemia    Hypertension    Thyroid disease     Past Surgical History:  Procedure Laterality Date   APPENDECTOMY     BIOPSY  11/02/2022   Procedure: BIOPSY;  Surgeon: Jackquline Denmark, MD;  Location: [facility] ENDOSCOPY;  Service: Gastroenterology;;   CESAREAN SECTION     x 2   COLONOSCOPY  01/22/2007   Colonic polyps status post polypectomy. Minimal sigmoid diverticulosis   CORONARY ARTERY BYPASS GRAFT N/A 11/21/2022   Procedure: CORONARY ARTERY BYPASS GRAFTING (CABG) X 3, USING LEFT INTERNAL MAMMARY ARTERY AND ENDOSCOPICALLY HARVESTED RIGHT GREATER SAPHENOUS VEIN.;  Surgeon: Dahlia Byes, MD;  Location: [place];  Service: Open Heart Surgery;  Laterality: N/A;    ESOPHAGOGASTRODUODENOSCOPY (EGD) WITH PROPOFOL N/A 11/02/2022   Procedure: ESOPHAGOGASTRODUODENOSCOPY (EGD) WITH PROPOFOL;  Surgeon: Jackquline Denmark, MD;  Location: [place];  Service: Gastroenterology;  Laterality: N/A;   HEMOSTASIS CLIP PLACEMENT  11/02/2022   Procedure: HEMOSTASIS CLIP PLACEMENT;  Surgeon: Jackquline Denmark, MD;  Location[facility] ENDOSCOPY;  Service: Gastroenterology;;   HEMOSTASIS CONTROL  11/02/2022   Procedure: HEMOSTASIS CONTROL;  Surgeon: Jackquline Denmark, MD;  Location: [facility] ENDOSCOPY;  Service: Gastroenterology;;   LEFT HEART CATH AND CORONARY ANGIOGRAPHY N/A 11/01/2022   Procedure: LEFT HEART CATH AND CORONARY ANGIOGRAPHY;  Surgeon: Adrian Prows, MD;  Location: [place] CV LAB;  Service: Cardiovascular;  Laterality: N/A;   REPLACEMENT TOTAL KNEE     left knee   SUBMUCOSAL INJECTION  11/02/2022   Procedure: SUBMUCOSAL INJECTION;  Surgeon: Jackquline Denmark, MD;  Location: [facility] ENDOSCOPY;  Service: Gastroenterology;;   TEE WITHOUT CARDIOVERSION N/A 11/21/2022   Procedure: TRANSESOPHAGEAL ECHOCARDIOGRAM (TEE);  Surgeon: Dahlia Byes, MD;  Location: [place];  Service: Open Heart Surgery;  Laterality: N/A;    Family History  Problem Relation Age of Onset   Hypertension Mother    High Cholesterol Mother    Hypertension Father    High Cholesterol Father    Heart failure Father     Social History   Tobacco Use   Smoking status: Never   Smokeless tobacco: Never  Vaping Use   Vaping Use: Never used  Substance Use Topics   Alcohol use: No  Drug use: No    Current Outpatient Medications  Medication Sig Dispense Refill   amiodarone (PACERONE) 200 MG tablet Take 1 tablet (200 mg total) by mouth daily.     aspirin EC 325 MG tablet Take 1 tablet (325 mg total) by mouth daily.     atorvastatin (LIPITOR) 80 MG tablet Take 1 tablet (80 mg total) by mouth at bedtime. 90 tablet 0   Fe Fum-Vit C-Vit B12-FA (TRIGELS-F FORTE) CAPS capsule Take 1 capsule by mouth daily after breakfast.  May substitute near equivalent  0   fenofibrate 160 MG tablet Take 1 tablet (160 mg total) by mouth daily. 90 tablet 0   folic acid (FOLVITE) 1 MG tablet Take 1 mg by mouth daily.     JARDIANCE 25 MG TABS tablet Take 25 mg by mouth daily.     levothyroxine (SYNTHROID, LEVOTHROID) 100 MCG tablet Take 100 mcg by mouth daily before breakfast.     metFORMIN (GLUCOPHAGE) 1000 MG tablet Take 2,000 mg by mouth in the morning and at bedtime.     metoprolol tartrate (LOPRESSOR) 50 MG tablet Take 1 tablet (50 mg total) by mouth 2 (two) times daily.     Multiple Vitamin (MULTIVITAMIN) capsule Take 1 capsule by mouth daily.     ondansetron (ZOFRAN) 4 MG tablet Take 1 tablet (4 mg total) by mouth 3 (three) times daily as needed for nausea or vomiting. 30 tablet 1   oxyCODONE (OXY IR/ROXICODONE) 5 MG immediate release tablet Take 5 mg by mouth every 4 (four) hours as needed for severe pain.     pantoprazole (PROTONIX) 40 MG tablet Take 1 tablet (40 mg total) by mouth daily. 30 tablet 11   pantoprazole (PROTONIX) 40 MG tablet TAKE 1 TABLET TWICE A DAY FOR 1 MONTH THEN DECREASE TO ONCE DAILY BEFORE BREAKFAST. 180 tablet 0   No current facility-administered medications for this visit.    No Known Allergies  Review of Systems:  neg     Physical Exam:    BP 124/70   Pulse (!) 56   Ht 5' (1.524 m)   Wt 175 lb (79.4 kg)   BMI 34.18 kg/m  Wt Readings from Last 3 Encounters:  01/22/23 175 lb (79.4 kg)  12/27/22 170 lb (77.1 kg)  12/25/22 170 lb (77.1 kg)   Constitutional:  Well-developed, in no acute distress. Psychiatric: Normal mood and affect. Behavior is normal. HEENT: Pupils normal.  Conjunctivae are normal. No scleral icterus.  Cardiovascular: Normal rate, regular rhythm. No edema Pulmonary/chest: Effort normal and breath sounds normal. No wheezing, rales or rhonchi. Abdominal: Soft, nondistended. Nontender. Bowel sounds active throughout. There are no masses palpable. No  hepatomegaly. Rectal: Deferred Neurological: Alert and oriented to person place and time. Skin: Skin is warm and dry. No rashes noted.  Data Reviewed: I have personally reviewed following labs and imaging studies  CBC:    Latest Ref Rng & Units 12/25/2022    2:09 PM 11/25/2022   12:25 AM 11/24/2022    4:41 AM  CBC  WBC 4.0 - 10.5 K/uL 9.8  10.4  10.3   Hemoglobin 12.0 - 15.0 g/dL 11.5  8.6  7.7   Hematocrit 36.0 - 46.0 % 35.8  27.1  25.2   Platelets 150.0 - 400.0 K/uL 340.0  148  155     CMP:    Latest Ref Rng & Units 11/26/2022   12:13 AM 11/25/2022    1:39 AM 11/25/2022   12:25 AM  CMP  Glucose 70 - 99 mg/dL 140  119  126   BUN 8 - 23 mg/dL '11  11  12   '$ Creatinine 0.44 - 1.00 mg/dL 0.81  0.78  0.75   Sodium 135 - 145 mmol/L 140  140  141   Potassium 3.5 - 5.1 mmol/L 4.2  4.0  6.0   Chloride 98 - 111 mmol/L 106  108  108   CO2 22 - 32 mmol/L '26  26  22   '$ Calcium 8.9 - 10.3 mg/dL 9.2  8.2  5.5      Radiology Studies: DG Chest 2 View  Result Date: 12/27/2022 CLINICAL DATA:  Post CABG, surgery in November of 2023. EXAM: CHEST - 2 VIEW COMPARISON:  November 24, 2022. FINDINGS: Post median sternotomy for CABG. Cardiomediastinal contours and hilar structures is are stable. Linear bandlike airspace opacities arise from the LEFT hilum. No lobar consolidative process. Mild blunting of LEFT costodiaphragmatic sulcus. No pneumothorax. On limited assessment no acute skeletal process. IMPRESSION: 1. Post median sternotomy for CABG. 2. Linear bandlike airspace opacities arise from the LEFT hilum, likely atelectasis. 3. Question trace LEFT effusion. Electronically Signed   By: Zetta Bills M.D.   On: 12/27/2022 13:18      Carmell Austria, MD 01/22/2023, 9:41 AM  Cc: Leonides Sake, MD

## 2023-02-05 ENCOUNTER — Encounter: Payer: Self-pay | Admitting: Cardiothoracic Surgery

## 2023-02-05 ENCOUNTER — Ambulatory Visit (INDEPENDENT_AMBULATORY_CARE_PROVIDER_SITE_OTHER): Payer: Self-pay | Admitting: Cardiothoracic Surgery

## 2023-02-05 VITALS — BP 135/72 | HR 71 | Resp 20 | Ht 60.0 in | Wt 171.0 lb

## 2023-02-05 DIAGNOSIS — I2510 Atherosclerotic heart disease of native coronary artery without angina pectoris: Secondary | ICD-10-CM

## 2023-02-05 DIAGNOSIS — Z7689 Persons encountering health services in other specified circumstances: Secondary | ICD-10-CM | POA: Insufficient documentation

## 2023-02-05 DIAGNOSIS — Z951 Presence of aortocoronary bypass graft: Secondary | ICD-10-CM

## 2023-02-05 HISTORY — DX: Persons encountering health services in other specified circumstances: Z76.89

## 2023-02-05 NOTE — Progress Notes (Signed)
HPI: The patient returns for final postop follow-up after urgent CABG times 02 November 2022.  The patient developed a STEMI after an upper GI bleed from peptic ulcer disease.  After the gastric bleeding site was clipped patient underwent coronary revascularization and recovered without incident.  She did have transient atrial fibrillation which converted to sinus rhythm with oral amiodarone. Patient has continued to progress and is now participating in cardiac rehab at [facility]  Her energy and exercise tolerance are improving and she denies any symptoms of angina or heart failure.  She denies any symptoms of bleeding and her last hemoglobin is 11.4. She has been followed up by her GI team and the source of the GI bleed has healed.  Patient also has been followed postop by cardiology and is scheduled for 49-monthfollow-up visit.  Her chest x-ray last month demonstrated clear lung fields without pleural effusion, sternal wires intact.  Patient is a diabetic and noted some left f fifth finger numbness following surgery but this is improving over time.   Current Outpatient Medications  Medication Sig Dispense Refill   aspirin EC 325 MG tablet Take 1 tablet (325 mg total) by mouth daily.     Fe Fum-Vit C-Vit B12-FA (TRIGELS-F FORTE) CAPS capsule Take 1 capsule by mouth daily after breakfast. May substitute near equivalent  0   folic acid (FOLVITE) 1 MG tablet Take 1 mg by mouth daily.     JARDIANCE 25 MG TABS tablet Take 25 mg by mouth daily.     levothyroxine (SYNTHROID, LEVOTHROID) 100 MCG tablet Take 100 mcg by mouth daily before breakfast.     metFORMIN (GLUCOPHAGE) 1000 MG tablet Take 2,000 mg by mouth in the morning and at bedtime.     metoprolol tartrate (LOPRESSOR) 50 MG tablet Take 1 tablet (50 mg total) by mouth 2 (two) times daily.     Multiple Vitamin (MULTIVITAMIN) capsule Take 1 capsule by mouth daily.     ondansetron (ZOFRAN) 4 MG tablet Take 1 tablet (4 mg total) by mouth 3  (three) times daily as needed for nausea or vomiting. 30 tablet 1   oxyCODONE (OXY IR/ROXICODONE) 5 MG immediate release tablet Take 5 mg by mouth every 4 (four) hours as needed for severe pain.     pantoprazole (PROTONIX) 40 MG tablet Take 1 tablet (40 mg total) by mouth daily. 30 tablet 11   pantoprazole (PROTONIX) 40 MG tablet TAKE 1 TABLET TWICE A DAY FOR 1 MONTH THEN DECREASE TO ONCE DAILY BEFORE BREAKFAST. 180 tablet 0   atorvastatin (LIPITOR) 80 MG tablet Take 1 tablet (80 mg total) by mouth at bedtime. 90 tablet 0   fenofibrate 160 MG tablet Take 1 tablet (160 mg total) by mouth daily. 90 tablet 0   No current facility-administered medications for this visit.     Physical Exam: Blood pressure 135/72, pulse 71, resp. rate 20, height 5' (1.524 m), weight 171 lb (77.6 kg), SpO2 97 %.         Exam    General- alert and comfortable.  Sternal incision well-healed.    Neck- no JVD, no cervical adenopathy palpable, no carotid bruit   Lungs- clear without rales, wheezes   Cor- regular rate and rhythm, no murmur , gallop   Abdomen- soft, non-tender   Extremities - warm, non-tender, minimal edema   Neuro- oriented, appropriate, no focal weakness  Diagnostic Tests: Last chest x-ray personally reviewed showing clear lung fields  Impression: Excellent recovery following urgent two-vessel CABG  following non-STEMI. The patient's sternal precautions and lifting restrictions will expire March 1 and she may return to work at that time.  A return to work form was filled out for the patient to give to the employer.  The patient can stop the amiodarone as she is maintaining sinus rhythm for over 2 months.  Patient understands importance of heart healthy lifestyle including heart healthy diet and regular exercise 30 minutes daily.  She also needs to continue her cardiac meds as directed by cardiology.  Plan: The patient will return to this office as needed.   Dahlia Byes, MD [facility] [phone]

## 2023-02-19 ENCOUNTER — Ambulatory Visit: Payer: Medicare Other | Admitting: Internal Medicine

## 2023-02-19 ENCOUNTER — Encounter: Payer: Self-pay | Admitting: Internal Medicine

## 2023-02-19 VITALS — BP 123/74 | HR 57 | Ht 60.0 in | Wt 173.0 lb

## 2023-02-19 DIAGNOSIS — I10 Essential (primary) hypertension: Secondary | ICD-10-CM

## 2023-02-19 DIAGNOSIS — E782 Mixed hyperlipidemia: Secondary | ICD-10-CM

## 2023-02-19 DIAGNOSIS — I214 Non-ST elevation (NSTEMI) myocardial infarction: Secondary | ICD-10-CM

## 2023-02-19 NOTE — Progress Notes (Signed)
Primary Physician/Referring:  Leonides Sake, MD  Patient ID: Mercedes Adams, female    DOB: 1956-06-07, 67 y.o.   MRN: [id number]  Chief Complaint  Patient presents with   NSTEMI   Follow-up   HPI:    Mercedes Adams  is a 67 y.o. female female with past medical history significant for diabetes, hypertension, and recent NSTEMI who is here for follow-up.  Patient is recovering so well after CABG. She has been released from the surgeons and she has no more sternal restrictions. She is doing cardiopulmonary rehab and getting stronger. Patient has also started water aerobics and she is loving it. No complaints or concerns today.  Patient denies chest pain, shortness of breath, palpitations, diaphoresis, syncope.  Past Medical History:  Diagnosis Date   Anxiety    Arthritis    Diabetes mellitus without complication ([place])    Heart murmur    Hyperlipidemia    Hypertension    Thyroid disease    Past Surgical History:  Procedure Laterality Date   APPENDECTOMY     BIOPSY  11/02/2022   Procedure: BIOPSY;  Surgeon: Jackquline Denmark, MD;  Location: [facility] ENDOSCOPY;  Service: Gastroenterology;;   CESAREAN SECTION     x 2   COLONOSCOPY  01/22/2007   Colonic polyps status post polypectomy. Minimal sigmoid diverticulosis   CORONARY ARTERY BYPASS GRAFT N/A 11/21/2022   Procedure: CORONARY ARTERY BYPASS GRAFTING (CABG) X 3, USING LEFT INTERNAL MAMMARY ARTERY AND ENDOSCOPICALLY HARVESTED RIGHT GREATER SAPHENOUS VEIN.;  Surgeon: Dahlia Byes, MD;  Location: [place];  Service: Open Heart Surgery;  Laterality: N/A;   ESOPHAGOGASTRODUODENOSCOPY (EGD) WITH PROPOFOL N/A 11/02/2022   Procedure: ESOPHAGOGASTRODUODENOSCOPY (EGD) WITH PROPOFOL;  Surgeon: Jackquline Denmark, MD;  Location: [place];  Service: Gastroenterology;  Laterality: N/A;   HEMOSTASIS CLIP PLACEMENT  11/02/2022   Procedure: HEMOSTASIS CLIP PLACEMENT;  Surgeon: Jackquline Denmark, MD;  Location: [facility] ENDOSCOPY;  Service: Gastroenterology;;    HEMOSTASIS CONTROL  11/02/2022   Procedure: HEMOSTASIS CONTROL;  Surgeon: Jackquline Denmark, MD;  Location: [facility] ENDOSCOPY;  Service: Gastroenterology;;   LEFT HEART CATH AND CORONARY ANGIOGRAPHY N/A 11/01/2022   Procedure: LEFT HEART CATH AND CORONARY ANGIOGRAPHY;  Surgeon: Adrian Prows, MD;  Location: [place] CV LAB;  Service: Cardiovascular;  Laterality: N/A;   REPLACEMENT TOTAL KNEE     left knee   SUBMUCOSAL INJECTION  11/02/2022   Procedure: SUBMUCOSAL INJECTION;  Surgeon: Jackquline Denmark, MD;  Location: [facility] ENDOSCOPY;  Service: Gastroenterology;;   TEE WITHOUT CARDIOVERSION N/A 11/21/2022   Procedure: TRANSESOPHAGEAL ECHOCARDIOGRAM (TEE);  Surgeon: Dahlia Byes, MD;  Location: [place];  Service: Open Heart Surgery;  Laterality: N/A;   Family History  Problem Relation Age of Onset   Hypertension Mother    High Cholesterol Mother    Hypertension Father    High Cholesterol Father    Heart failure Father     Social History   Tobacco Use   Smoking status: Never   Smokeless tobacco: Never  Substance Use Topics   Alcohol use: No   Marital Status: Married  ROS  Review of Systems  Cardiovascular:  Negative for chest pain, dyspnea on exertion and palpitations.   Objective  Blood pressure 123/74, pulse (!) 57, height 5' (1.524 m), weight 173 lb (78.5 kg), SpO2 97 %. Body mass index is 33.79 kg/m.     02/19/2023    9:23 AM 02/05/2023   10:23 AM 01/22/2023    9:34 AM  Vitals with BMI  Height 5'  0" 5' 0"$  5' 0"$   Weight 173 lbs 171 lbs 175 lbs  BMI 33.79 [id number]  Systolic [id number]  Diastolic 74 72 70  Pulse 57 71 56     Physical Exam Vitals reviewed.  HENT:     Head: Normocephalic and atraumatic.  Cardiovascular:     Rate and Rhythm: Normal rate and regular rhythm.     Pulses: Normal pulses.     Heart sounds: Normal heart sounds. No murmur heard. Pulmonary:     Effort: Pulmonary effort is normal.     Breath sounds: Normal breath sounds.  Abdominal:     General: Bowel  sounds are normal.  Musculoskeletal:     Right lower leg: No edema.     Left lower leg: No edema.  Skin:    General: Skin is warm and dry.  Neurological:     Mental Status: She is alert.     Medications and allergies  No Known Allergies   Medication list after today's encounter   Current Outpatient Medications:    amiodarone (PACERONE) 200 MG tablet, Take 200 mg by mouth daily., Disp: , Rfl:    aspirin EC 325 MG tablet, Take 1 tablet (325 mg total) by mouth daily., Disp: , Rfl:    atorvastatin (LIPITOR) 80 MG tablet, Take 1 tablet (80 mg total) by mouth at bedtime., Disp: 90 tablet, Rfl: 0   Fe Fum-Vit C-Vit B12-FA (TRIGELS-F FORTE) CAPS capsule, Take 1 capsule by mouth daily after breakfast. May substitute near equivalent, Disp: , Rfl: 0   fenofibrate 160 MG tablet, Take 1 tablet (160 mg total) by mouth daily., Disp: 90 tablet, Rfl: 0   folic acid (FOLVITE) 1 MG tablet, Take 1 mg by mouth daily., Disp: , Rfl:    JARDIANCE 25 MG TABS tablet, Take 25 mg by mouth daily., Disp: , Rfl:    levothyroxine (SYNTHROID, LEVOTHROID) 100 MCG tablet, Take 100 mcg by mouth daily before breakfast., Disp: , Rfl:    metFORMIN (GLUCOPHAGE) 1000 MG tablet, Take 2,000 mg by mouth in the morning and at bedtime., Disp: , Rfl:    metoprolol tartrate (LOPRESSOR) 50 MG tablet, Take 1 tablet (50 mg total) by mouth 2 (two) times daily., Disp: , Rfl:    Multiple Vitamin (MULTIVITAMIN) capsule, Take 1 capsule by mouth daily., Disp: , Rfl:    ondansetron (ZOFRAN) 4 MG tablet, Take 1 tablet (4 mg total) by mouth 3 (three) times daily as needed for nausea or vomiting., Disp: 30 tablet, Rfl: 1   oxyCODONE (OXY IR/ROXICODONE) 5 MG immediate release tablet, Take 5 mg by mouth every 4 (four) hours as needed for severe pain., Disp: , Rfl:    pantoprazole (PROTONIX) 40 MG tablet, TAKE 1 TABLET TWICE A DAY FOR 1 MONTH THEN DECREASE TO ONCE DAILY BEFORE BREAKFAST., Disp: 180 tablet, Rfl: 0  Laboratory examination:   Lab  Results  Component Value Date   NA 139 01/22/2023   K 5.3 (H) 01/22/2023   CO2 25 01/22/2023   GLUCOSE 195 (H) 01/22/2023   BUN 18 01/22/2023   CREATININE 0.86 01/22/2023   CALCIUM 9.2 01/22/2023   GFRNONAA >60 11/26/2022       Latest Ref Rng & Units 01/22/2023   10:03 AM 11/26/2022   12:13 AM 11/25/2022    1:39 AM  CMP  Glucose 70 - 99 mg/dL 195  140  119   BUN 6 - 23 mg/dL 18  11  11   $ Creatinine 0.40 -  1.20 mg/dL 0.86  0.81  0.78   Sodium 135 - 145 mEq/L 139  140  140   Potassium 3.5 - 5.1 mEq/L 5.3  4.2  4.0   Chloride 96 - 112 mEq/L 106  106  108   CO2 19 - 32 mEq/L 25  26  26   $ Calcium 8.4 - 10.5 mg/dL 9.2  9.2  8.2   Total Protein 6.0 - 8.3 g/dL 7.2     Total Bilirubin 0.2 - 1.2 mg/dL 0.3     Alkaline Phos 39 - 117 U/L 79     AST 0 - 37 U/L 14     ALT 0 - 35 U/L 24         Latest Ref Rng & Units 01/22/2023   10:03 AM 12/25/2022    2:09 PM 11/25/2022   12:25 AM  CBC  WBC 4.0 - 10.5 K/uL 9.3  9.8  10.4   Hemoglobin 12.0 - 15.0 g/dL 11.4  11.5  8.6   Hematocrit 36.0 - 46.0 % 36.4  35.8  27.1   Platelets 150.0 - 400.0 K/uL 267.0  340.0  148     Lipid Panel Recent Labs    11/02/22 0758  CHOL 137  TRIG 179*  LDLCALC 70  VLDL 36  HDL 31*  CHOLHDL 4.4  LDLDIRECT 79    HEMOGLOBIN A1C Lab Results  Component Value Date   HGBA1C 8.0 (H) 11/01/2022   MPG 182.9 11/01/2022   TSH Recent Labs    11/01/22 1028 11/19/22 1335 11/25/22 1000  TSH 8.243* 2.254 1.298    External labs:     Radiology:    Cardiac Studies:   ECHO COMPLETE WITH IMAGING ENHANCING AGENT 11/02/2022  1. Definity was used for evaluation of wallmotion and EF estimation. Left ventricular ejection fraction, by estimation, is 50 to 55%. Left ventricular ejection fraction by 2D MOD biplane is 54.7 %. The left ventricle has low normal function. The left ventricle demonstrates regional wall motion abnormalities (see scoring diagram/findings for description). There is mild left  ventricular hypertrophy. Left ventricular diastolic parameters are consistent with Grade II diastolic dysfunction (pseudonormalization). Elevated left ventricular end-diastolic pressure. There is mild hypokinesis of the left ventricular, entire inferior wall. 2. Right ventricular systolic function is normal. The right ventricular size is normal. There is normal pulmonary artery systolic pressure. The estimated right ventricular systolic pressure is 99991111 mmHg. 3. Left atrial size was mildly dilated. 4. The mitral valve is normal in structure. Mild to moderate mitral valve regurgitation. 5. The aortic valve was not well visualized. Aortic valve regurgitation is not visualized. Aortic valve sclerosis/calcification is present, without any evidence of aortic stenosis. 6. The inferior vena cava is normal in size with greater than 50% respiratory variability, suggesting right atrial pressure of 3 mmHg.         11/01/2022:    Impression: Severe multivessel coronary artery disease.  Moderate to severely reduced LVEF, EF anywhere from 30 to 35%, would ideally be a CABG candidate especially in view of GI bleed and difficulty in dual antiplatelet therapy long-term.  However for now we will manage her medically, obtain the CTS consult electively, do not think that the LAD is bypassable in view of severe disease.  Large RCA with large PDA and PL branch, fairly moderate-sized D2 and large CX are bypassable.    11/19/22 May be a candidate for the [facility] HF trial. Would have to screen after 12/07/22 due to recent MI. Also a candidate for  Lux Dx.  Cholesterol, total 149.000 m 06/29/2022 HDL 45.000 mg 06/29/2022 LDL 74.000 mg 06/29/2022 Triglycerides 179.000 m 06/29/2022  A1C 7.800 % 06/29/2022  Hemoglobin 13.800 G/ 09/05/2022 Platelets 224.000 X1 09/05/2022  Creatinine, Serum 0.890 MG/ 09/05/2022 Potassium 4.300 mm 06/29/2022 ALT (SGPT) 8.000 IU/L 09/05/2022  TSH 2.800 06/29/2022   Pre CABG Vascular Ultrasound  10/2022: Right Carotid: The extracranial vessels were near-normal with only minimal wall thickening or plaque.  Left Carotid: Velocities in the left ICA are consistent with a 1-39% stenosis. Vertebrals: Bilateral vertebral arteries demonstrate antegrade flow. Subclavians: Normal flow hemodynamics were seen in bilateral subclavian arteries.  Right ABI: Resting right ankle-brachial index indicates noncompressible right lower extremity arteries. The right toe-brachial index is abnormal. Although ankle-brachial index is within normal range, waveforms suggest some degree of arterial disease.  Left ABI: Resting left ankle-brachial index is within normal range. The left toe-brachial index is abnormal. Although ankle-brachial index is within normal range, waveforms suggest some degree of arterial disease.  Right Upper Extremity: Inconclusive Allen's test secondary to occluded right radial artery s/p catheterization.  Left Upper Extremity: Doppler waveform obliterate with left radial compression. Doppler waveforms remain within normal limits with left ulnar compression.   EKG:   11/19/2022: Sinus Rhythm -Old inferior infarct with true posterior extension of inferior MI.   Assessment     ICD-10-CM   1. Essential hypertension  I10     2. NSTEMI (non-ST elevated myocardial infarction) ([place])  I21.4     3. Mixed hyperlipidemia  E78.2        No orders of the defined types were placed in this encounter.   No orders of the defined types were placed in this encounter.   Medications Discontinued During This Encounter  Medication Reason   pantoprazole (PROTONIX) 40 MG tablet Duplicate      Recommendations:   Mercedes Adams is a 67 y.o.  female with CAD   NSTEMI (non-ST elevated myocardial infarction) ([place]) Patient is S/P CABG x3 LIMA to diagonal, SVG to OM, SVG to PDA  OFF sternal restrictions, she is back to exercising   Essential hypertension Continue current cardiac  medications. Encourage low-sodium diet, less than 2000 mg daily. BP is very well controlled   Mixed hyperlipidemia Continue statin     Floydene Flock, DO, [facility]  02/19/2023, 1:49 PM Office: [phone] Pager: [phone]

## 2023-03-28 ENCOUNTER — Other Ambulatory Visit: Payer: Self-pay

## 2023-04-17 ENCOUNTER — Telehealth: Payer: Self-pay

## 2023-04-17 NOTE — Telephone Encounter (Signed)
-----   Message from Lynann Bologna, MD sent at 04/16/2023  7:42 PM EDT ----- Regarding: RE: labs Hi Beth,  Lets get CBC, CMP, iron studies prior to OV (routine) OV with me  If still IDA- needs colon  RG ----- Message ----- From: Evalee Jefferson, LPN Sent: 1/61/0960  10:46 AM EDT To: Lynann Bologna, MD Subject: FW: labs                                       Greetings In December, Amy saw this patient and planned for a repeat of her CBC, IBC and Ferritin this month. You saw her in January. Labs were repeated. Does she need to follow up with [place] and repeat the labs? Asking before I call her to come in. Thanks Buyer, retail (pesky) ----- Message ----- From: Evalee Jefferson, LPN Sent: 4/54/0981  12:00 AM EDT To: Evalee Jefferson, LPN Subject: labs                                           Call the patient to remind her to come in for her iron studies. Put in order for CBC and IBC+Ferritin

## 2023-04-18 NOTE — Telephone Encounter (Signed)
Called the patient to discuss. No answer. Left a message of my call and the reason. Asked she call back to discuss.

## 2023-04-29 ENCOUNTER — Other Ambulatory Visit: Payer: Self-pay

## 2023-05-06 ENCOUNTER — Other Ambulatory Visit: Payer: Self-pay

## 2023-05-06 ENCOUNTER — Telehealth: Payer: Self-pay

## 2023-05-06 MED ORDER — FUROSEMIDE 20 MG PO TABS: 20.0000 mg | ORAL_TABLET | Freq: Every day | ORAL | 3 refills | Status: DC

## 2023-05-06 NOTE — Telephone Encounter (Signed)
Patient called back. I have let her know that Lasix has been sent to her pharmacy.

## 2023-05-06 NOTE — Telephone Encounter (Signed)
Lasix sent into pharmacy. Called and left VM for patient to call back.

## 2023-05-06 NOTE — Telephone Encounter (Signed)
Send lasix 20 mg daily for 1 week until weight and swelling back to baseline

## 2023-05-06 NOTE — Telephone Encounter (Signed)
Patient calling with complaints of swelling in her ankles and weight gain of 5 pounds over a week. Denies chest pain and SOB.

## 2023-06-06 ENCOUNTER — Telehealth: Payer: Self-pay | Admitting: Gastroenterology

## 2023-06-06 NOTE — Telephone Encounter (Signed)
See  04/17/23 telephone note for additional content.

## 2023-06-06 NOTE — Telephone Encounter (Signed)
Left message on mobile number for patient to return my call. Also called home phone. Spoke with female who states that patient will be home later. Patient is already scheduled for follow up with Dr Mercedes Adams on 08/22/23 at 11:20 am. Orders are entered in [facility] for her to have labs 1-2 weeks prior to her visit. Patient just needs to be advised of this information.

## 2023-06-06 NOTE — Telephone Encounter (Signed)
I have spoken to patient and advised of upcoming follow up with Dr Chales Abrahams on 08/22/23 as well as the need for labs prior to that appointment. Patient verbalizes understanding of this information and is in agreement with the plan.

## 2023-06-06 NOTE — Telephone Encounter (Signed)
Incoming call from patient states she is returning a call. 

## 2023-08-20 ENCOUNTER — Encounter: Payer: Self-pay | Admitting: Cardiology

## 2023-08-20 ENCOUNTER — Ambulatory Visit: Payer: Medicare Other | Admitting: Cardiology

## 2023-08-20 VITALS — BP 135/70 | HR 76 | Resp 16 | Ht 60.0 in | Wt 186.0 lb

## 2023-08-20 DIAGNOSIS — R0609 Other forms of dyspnea: Secondary | ICD-10-CM

## 2023-08-20 DIAGNOSIS — E782 Mixed hyperlipidemia: Secondary | ICD-10-CM

## 2023-08-20 DIAGNOSIS — I25118 Atherosclerotic heart disease of native coronary artery with other forms of angina pectoris: Secondary | ICD-10-CM

## 2023-08-20 DIAGNOSIS — I10 Essential (primary) hypertension: Secondary | ICD-10-CM

## 2023-08-20 MED ORDER — LOSARTAN POTASSIUM-HCTZ 50-12.5 MG PO TABS: 1.0000 | ORAL_TABLET | ORAL | 2 refills | Status: DC

## 2023-08-20 MED ORDER — ROSUVASTATIN CALCIUM 40 MG PO TABS: 40.0000 mg | ORAL_TABLET | Freq: Every day | ORAL | 3 refills | Status: DC

## 2023-08-20 MED ORDER — METOPROLOL TARTRATE 100 MG PO TABS: 100.0000 mg | ORAL_TABLET | Freq: Two times a day (BID) | ORAL | 3 refills | Status: AC

## 2023-08-20 MED ORDER — FUROSEMIDE 20 MG PO TABS: 20.0000 mg | ORAL_TABLET | Freq: Every day | ORAL | Status: AC | PRN

## 2023-08-20 NOTE — Progress Notes (Signed)
Primary Physician/Referring:  Ailene Ravel, MD  Patient ID: LAI SAESEE, female    DOB: 1956-11-14, 67 y.o.   MRN: [id number]  Chief Complaint  Patient presents with   Hypertension   Follow-up    6 months   HPI:    Mercedes Adams  is a 67 y.o. Patient with diabetes, hypertension, hypercholesterolemia, NSTEMI in November 2023 SP CABG x 3 on 11/21/2022, presents for 75-month follow-up visit.  Patient has noticed gradually worsening dyspnea and states that she is concerned over progression of coronary artery disease as when she presented to the hospital she never had chest pain and NSTEMI occurred when she had GI bleed.  She also noticed elevated heart rate with minimal activity and slight worsening leg edema as well.  No PND or orthopnea.  Past Medical History:  Diagnosis Date   Anxiety    Arthritis    Diabetes mellitus without complication (HCC)    Heart murmur    Hyperlipidemia    Hypertension    Thyroid disease    Past Surgical History:  Procedure Laterality Date   APPENDECTOMY     BIOPSY  11/02/2022   Procedure: BIOPSY;  Surgeon: Lynann Bologna, MD;  Location: [facility] ENDOSCOPY;  Service: Gastroenterology;;   CESAREAN SECTION     x 2   COLONOSCOPY  01/22/2007   Colonic polyps status post polypectomy. Minimal sigmoid diverticulosis   CORONARY ARTERY BYPASS GRAFT N/A 11/21/2022   Procedure: CORONARY ARTERY BYPASS GRAFTING (CABG) X 3, USING LEFT INTERNAL MAMMARY ARTERY AND ENDOSCOPICALLY HARVESTED RIGHT GREATER SAPHENOUS VEIN.;  Surgeon: Lovett Sox, MD;  Location: [facility] OR;  Service: Open Heart Surgery;  Laterality: N/A;   ESOPHAGOGASTRODUODENOSCOPY (EGD) WITH PROPOFOL N/A 11/02/2022   Procedure: ESOPHAGOGASTRODUODENOSCOPY (EGD) WITH PROPOFOL;  Surgeon: Lynann Bologna, MD;  Location: [facility] ENDOSCOPY;  Service: Gastroenterology;  Laterality: N/A;   HEMOSTASIS CLIP PLACEMENT  11/02/2022   Procedure: HEMOSTASIS CLIP PLACEMENT;  Surgeon: Lynann Bologna, MD;  Location: [facility] ENDOSCOPY;   Service: Gastroenterology;;   HEMOSTASIS CONTROL  11/02/2022   Procedure: HEMOSTASIS CONTROL;  Surgeon: Lynann Bologna, MD;  Location: [facility] ENDOSCOPY;  Service: Gastroenterology;;   LEFT HEART CATH AND CORONARY ANGIOGRAPHY N/A 11/01/2022   Procedure: LEFT HEART CATH AND CORONARY ANGIOGRAPHY;  Surgeon: Yates Decamp, MD;  Location: [facility] INVASIVE CV LAB;  Service: Cardiovascular;  Laterality: N/A;   REPLACEMENT TOTAL KNEE     left knee   SUBMUCOSAL INJECTION  11/02/2022   Procedure: SUBMUCOSAL INJECTION;  Surgeon: Lynann Bologna, MD;  Location: [facility] ENDOSCOPY;  Service: Gastroenterology;;   TEE WITHOUT CARDIOVERSION N/A 11/21/2022   Procedure: TRANSESOPHAGEAL ECHOCARDIOGRAM (TEE);  Surgeon: Lovett Sox, MD;  Location: [facility] OR;  Service: Open Heart Surgery;  Laterality: N/A;   Family History  Problem Relation Age of Onset   Hypertension Mother    High Cholesterol Mother    Hypertension Father    High Cholesterol Father    Heart failure Father     Social History   Tobacco Use   Smoking status: Never   Smokeless tobacco: Never  Substance Use Topics   Alcohol use: No   Marital Status: Married  ROS  Review of Systems  Cardiovascular:  Positive for dyspnea on exertion, leg swelling and palpitations. Negative for chest pain.   Objective      08/20/2023   10:06 AM 02/19/2023    9:23 AM 02/05/2023   10:23 AM  Vitals with BMI  Height 5\' 0"  5\' 0"  5\' 0"   Weight 186 lbs 173  lbs 171 lbs  BMI 36.33 33.79 33.4  Systolic 135 123 220  Diastolic 70 74 72  Pulse 76 57 71   Blood pressure 135/70, pulse 76, resp. rate 16, height 5' (1.524 m), weight 186 lb (84.4 kg), SpO2 95%.  Physical Exam Constitutional:      Appearance: She is obese.  Neck:     Vascular: No carotid bruit or JVD.  Cardiovascular:     Rate and Rhythm: Normal rate and regular rhythm.     Pulses: Intact distal pulses.          Radial pulses are 0 on the right side and 2+ on the left side.       Dorsalis pedis pulses are 1+ on  the right side and 1+ on the left side.       Posterior tibial pulses are 1+ on the right side and 1+ on the left side.     Heart sounds: Normal heart sounds. No murmur heard.    No gallop.  Pulmonary:     Effort: Pulmonary effort is normal.     Breath sounds: Normal breath sounds.  Abdominal:     General: Bowel sounds are normal.     Palpations: Abdomen is soft.  Musculoskeletal:     Right lower leg: Edema (trace) present.     Left lower leg: Edema (trace) present.    Laboratory examination:   Recent Labs    11/25/22 0025 11/25/22 0139 11/26/22 0013 01/22/23 1003  NA 141 140 140 139  K 6.0* 4.0 4.2 5.3*  CL 108 108 106 106  CO2 22 26 26 25   GLUCOSE 126* 119* 140* 195*  BUN 12 11 11 18   CREATININE 0.75 0.78 0.81 0.86  CALCIUM 5.5* 8.2* 9.2 9.2  GFRNONAA >60 >60 >60  --     Lab Results  Component Value Date   GLUCOSE 195 (H) 01/22/2023   NA 139 01/22/2023   K 5.3 (H) 01/22/2023   CL 106 01/22/2023   CO2 25 01/22/2023   BUN 18 01/22/2023   CREATININE 0.86 01/22/2023   GFRNONAA >60 11/26/2022   CALCIUM 9.2 01/22/2023   PROT 7.2 01/22/2023   ALBUMIN 4.0 01/22/2023   BILITOT 0.3 01/22/2023   ALKPHOS 79 01/22/2023   AST 14 01/22/2023   ALT 24 01/22/2023   ANIONGAP 8 11/26/2022      Lab Results  Component Value Date   ALT 24 01/22/2023   AST 14 01/22/2023   ALKPHOS 79 01/22/2023   BILITOT 0.3 01/22/2023       Latest Ref Rng & Units 01/22/2023   10:03 AM 12/25/2022    2:09 PM 11/25/2022   12:25 AM  CBC  WBC 4.0 - 10.5 K/uL 9.3  9.8  10.4   Hemoglobin 12.0 - 15.0 g/dL 25.4  27.0  8.6   Hematocrit 36.0 - 46.0 % 36.4  35.8  27.1   Platelets 150.0 - 400.0 K/uL 267.0  340.0  148        Latest Ref Rng & Units 01/22/2023   10:03 AM 11/19/2022    1:35 PM 11/02/2022    7:58 AM  Hepatic Function  Total Protein 6.0 - 8.3 g/dL 7.2  7.0  5.5   Albumin 3.5 - 5.2 g/dL 4.0  4.0  2.7   AST 0 - 37 U/L 14  17  26    ALT 0 - 35 U/L 24  13  17    Alk Phosphatase 39 -  117 U/L 79  47  53   Total Bilirubin 0.2 - 1.2 mg/dL 0.3  0.4  1.7     Lipid Panel Recent Labs    11/02/22 0758  CHOL 137  TRIG 179*  LDLCALC 70  VLDL 36  HDL 31*  CHOLHDL 4.4  LDLDIRECT 79    HEMOGLOBIN A1C Lab Results  Component Value Date   HGBA1C 8.0 (H) 11/01/2022   MPG 182.9 11/01/2022   TSH Recent Labs    11/01/22 1028 11/19/22 1335 11/25/22 1000  TSH 8.243* 2.254 1.298    External labs:   Cholesterol, total 159.000 m 07/17/2023 HDL 47.000 mg 07/17/2023 LDL 81.000 mg 07/17/2023 Triglycerides 182.000 m 07/17/2023  A1C 7.000 % 07/17/2023 TSH 4.890 07/17/2023  Hemoglobin 13.600 g/d 03/15/2023 Platelets 229.000 x1 03/15/2023  Creatinine, Serum 0.810 mg/ 07/17/2023 Potassium 4.200 mm 07/17/2023 Magnesium 1.500 MG/ 07/17/2023 ALT (SGPT) 29.000 IU/ 07/17/2023  Radiology:    Cardiac Studies:   ECHO COMPLETE WITH IMAGING ENHANCING AGENT 11/02/2022  1. Definity was used for evaluation of wallmotion and EF estimation. Left ventricular ejection fraction, by estimation, is 50 to 55%. Left ventricular ejection fraction by 2D MOD biplane is 54.7 %. The left ventricle has low normal function. The left ventricle demonstrates regional wall motion abnormalities (see scoring diagram/findings for description). There is mild left ventricular hypertrophy. Left ventricular diastolic parameters are consistent with Grade II diastolic dysfunction (pseudonormalization). Elevated left ventricular end-diastolic pressure. There is mild hypokinesis of the left ventricular, entire inferior wall. 2. Right ventricular systolic function is normal. The right ventricular size is normal. There is normal pulmonary artery systolic pressure. The estimated right ventricular systolic pressure is 34.4 mmHg. 3. Left atrial size was mildly dilated. 4. The mitral valve is normal in structure. Mild to moderate mitral valve regurgitation. 5. The aortic valve was not well visualized. Aortic valve  regurgitation is not visualized. Aortic valve sclerosis/calcification is present, without any evidence of aortic stenosis. 6. The inferior vena cava is normal in size with greater than 50% respiratory variability, suggesting right atrial pressure of 3 mmHg.  Left Heart Catheterization 11/01/22:  LV: 70/0, EDP 11 mmHg.  Aortic pressure 72/44, mean 58 mmHg.  Patient received 500 cc bolus with immediate normalization of blood pressure, 90/66 mmHg. LVEF moderate to severely reduced at 30 to 35%, inferior and septal severe hypokinesis and global hypokinesis. LM: Large vessel, mildly calcified. LAD: Large vessel, gives origin to a moderate-sized D2 with ostial 90% stenosis.  Proximal to D2 there is a focal 90 to 95% stenosis in the LAD.  Mid segment has a 80% stenosis and is diffusely diseased in the mid to distal segment all the way to the apex and moderately diffusely calcified. LCx: Moderate to large caliber vessel, again mild amount of calcification noted throughout the vessel.  Proximal segment has a focal 80% stenosis. RI: Moderate caliber vessel.  Mildly tortuous in the midsegment.  Mild disease. RCA: Very large caliber vessel.  Occluded in the mid segment of very small RV branch.  It has contralateral collaterals from the LAD and circumflex.  There appears to be microchannel's with filling of the RCA all the way to very large PDA and PL branches.  PL has about a 60% proximal stenosis and PDA has about a 30 to 40% stenosis.       CABG times 03/19/2022 with LIMA to LAD, SVG to a complex PL branch , SVG to RCA  EKG:   EKG 08/20/2023: Normal sinus rhythm with rate of 65 bpm.  No  significant change from 12/10/2022.    Medications and allergies  No Known Allergies  Medication list   Current Outpatient Medications:    alendronate (FOSAMAX) 70 MG tablet, Take 70 mg by mouth once a week., Disp: , Rfl:    aspirin EC 325 MG tablet, Take 1 tablet (325 mg total) by mouth daily., Disp: , Rfl:    FARXIGA  10 MG TABS tablet, Take 10 mg by mouth daily., Disp: , Rfl:    Fe Fum-Vit C-Vit B12-FA (TRIGELS-F FORTE) CAPS capsule, Take 1 capsule by mouth daily after breakfast. May substitute near equivalent, Disp: , Rfl: 0   fenofibrate 160 MG tablet, Take 1 tablet (160 mg total) by mouth daily., Disp: 90 tablet, Rfl: 0   folic acid (FOLVITE) 1 MG tablet, Take 1 mg by mouth daily., Disp: , Rfl:    levothyroxine (SYNTHROID, LEVOTHROID) 100 MCG tablet, Take 100 mcg by mouth daily before breakfast., Disp: , Rfl:    losartan-hydrochlorothiazide (HYZAAR) 50-12.5 MG tablet, Take 1 tablet by mouth every morning., Disp: 30 tablet, Rfl: 2   metFORMIN (GLUCOPHAGE) 1000 MG tablet, Take 2,000 mg by mouth in the morning and at bedtime., Disp: , Rfl:    Multiple Vitamin (MULTIVITAMIN) capsule, Take 1 capsule by mouth daily., Disp: , Rfl:    ondansetron (ZOFRAN) 4 MG tablet, Take 1 tablet (4 mg total) by mouth 3 (three) times daily as needed for nausea or vomiting., Disp: 30 tablet, Rfl: 1   oxyCODONE (OXY IR/ROXICODONE) 5 MG immediate release tablet, Take 5 mg by mouth every 4 (four) hours as needed for severe pain., Disp: , Rfl:    pantoprazole (PROTONIX) 40 MG tablet, TAKE 1 TABLET TWICE A DAY FOR 1 MONTH THEN DECREASE TO ONCE DAILY BEFORE BREAKFAST., Disp: 180 tablet, Rfl: 0   furosemide (LASIX) 20 MG tablet, Take 1 tablet (20 mg total) by mouth daily as needed for fluid., Disp: , Rfl:    metoprolol tartrate (LOPRESSOR) 100 MG tablet, Take 1 tablet (100 mg total) by mouth 2 (two) times daily., Disp: 180 tablet, Rfl: 3   rosuvastatin (CRESTOR) 40 MG tablet, Take 1 tablet (40 mg total) by mouth at bedtime., Disp: 90 tablet, Rfl: 3  Assessment     ICD-10-CM   1. Coronary artery disease of native artery of native heart with stable angina pectoris (HCC)  I25.118 EKG 12-Lead    metoprolol tartrate (LOPRESSOR) 100 MG tablet    losartan-hydrochlorothiazide (HYZAAR) 50-12.5 MG tablet    2. Dyspnea on exertion  R06.09      3. Essential hypertension  I10 losartan-hydrochlorothiazide (HYZAAR) 50-12.5 MG tablet    Basic metabolic panel    4. Mixed hyperlipidemia  E78.2 rosuvastatin (CRESTOR) 40 MG tablet    Lipid Panel With LDL/HDL Ratio       Orders Placed This Encounter  Procedures   Lipid Panel With LDL/HDL Ratio   Basic metabolic panel   EKG 12-Lead    Meds ordered this encounter  Medications   metoprolol tartrate (LOPRESSOR) 100 MG tablet    Sig: Take 1 tablet (100 mg total) by mouth 2 (two) times daily.    Dispense:  180 tablet    Refill:  3   rosuvastatin (CRESTOR) 40 MG tablet    Sig: Take 1 tablet (40 mg total) by mouth at bedtime.    Dispense:  90 tablet    Refill:  3   furosemide (LASIX) 20 MG tablet    Sig: Take 1 tablet (20 mg total) by  mouth daily as needed for fluid.   losartan-hydrochlorothiazide (HYZAAR) 50-12.5 MG tablet    Sig: Take 1 tablet by mouth every morning.    Dispense:  30 tablet    Refill:  2    Medications Discontinued During This Encounter  Medication Reason   atorvastatin (LIPITOR) 80 MG tablet Change in therapy   amiodarone (PACERONE) 200 MG tablet    JARDIANCE 25 MG TABS tablet Change in therapy   metoprolol tartrate (LOPRESSOR) 50 MG tablet    furosemide (LASIX) 20 MG tablet    rosuvastatin (CRESTOR) 20 MG tablet Reorder     Recommendations:   Mercedes Adams is a 67 y.o. Patient with diabetes, hypertension, hypercholesterolemia, NSTEMI in November 2023 SP CABG x 3 on 11/21/2022, presents for 41-month follow-up visit.  1. Coronary artery disease of native artery of native heart with stable angina pectoris [facility]) Patient has noticed gradually progressive dyspnea and also palpitations with exertional activity.  I suspect this is related to weight gain of 13 to 14 pounds in just 6 months and lack of physical activity.  Do not suspect progression of coronary disease.  Blood pressure is also elevated and lipids are uncontrolled as well. I will increase the dose of  the metoprolol to tartrate from 50 mg twice daily to 100 mg twice daily, see how she feels.  - EKG 12-Lead - metoprolol tartrate (LOPRESSOR) 100 MG tablet; Take 1 tablet (100 mg total) by mouth 2 (two) times daily.  Dispense: 180 tablet; Refill: 3 - losartan-hydrochlorothiazide (HYZAAR) 50-12.5 MG tablet; Take 1 tablet by mouth every morning.  Dispense: 30 tablet; Refill: 2  2. Dyspnea on exertion As dictated above related to hypertension, obesity and deconditioning.  A component of diastolic heart failure could be contributing as well.  Weight loss and regular exercise were discussed extensively.  3. Essential hypertension In view of diabetes mellitus, I have also added losartan HCT 50/12.5 mg in the morning.  She will need a BMP in 2 to 3 weeks.  I am sorry - losartan-hydrochlorothiazide (HYZAAR) 50-12.5 MG tablet; Take 1 tablet by mouth every morning.  Dispense: 30 tablet; Refill: 2 - Basic metabolic panel  4. Mixed hyperlipidemia Lipids not at goal, Elevated TG due to poor diet. Eating out frequently. Discussed low fat diet. Increase Crestor to 40 mg daily and continue Zetia. Check lipids in 6 weeks prior to next OV.   40 min encounter and discussions regarding cardiovascular risks, medications, diet and exercise along with making medication changes, review of external records..   - rosuvastatin (CRESTOR) 40 MG tablet; Take 1 tablet (40 mg total) by mouth at bedtime.  Dispense: 90 tablet; Refill: 3 - Lipid Panel With LDL/HDL Ratio  Other orders - alendronate (FOSAMAX) 70 MG tablet; Take 70 mg by mouth once a week. - FARXIGA 10 MG TABS tablet; Take 10 mg by mouth daily. - furosemide (LASIX) 20 MG tablet; Take 1 tablet (20 mg total) by mouth daily as needed for fluid.     Yates Decamp, MD, [facility] 08/20/2023, 6:18 PM Office: [phone]

## 2023-08-22 ENCOUNTER — Encounter: Payer: Self-pay | Admitting: Gastroenterology

## 2023-08-22 ENCOUNTER — Other Ambulatory Visit (INDEPENDENT_AMBULATORY_CARE_PROVIDER_SITE_OTHER): Payer: Medicare Other

## 2023-08-22 ENCOUNTER — Ambulatory Visit (INDEPENDENT_AMBULATORY_CARE_PROVIDER_SITE_OTHER): Payer: Medicare Other | Admitting: Gastroenterology

## 2023-08-22 VITALS — BP 110/60 | HR 60 | Ht 60.0 in | Wt 186.4 lb

## 2023-08-22 DIAGNOSIS — R1031 Right lower quadrant pain: Secondary | ICD-10-CM

## 2023-08-22 DIAGNOSIS — Z86010 Personal history of colonic polyps: Secondary | ICD-10-CM

## 2023-08-22 LAB — CBC WITH DIFFERENTIAL/PLATELET
Basophils Absolute: 0.1 10*3/uL (ref 0.0–0.1)
Basophils Relative: 1.2 % (ref 0.0–3.0)
Eosinophils Absolute: 0.4 10*3/uL (ref 0.0–0.7)
Eosinophils Relative: 3.8 % (ref 0.0–5.0)
HCT: 39.6 % (ref 36.0–46.0)
Hemoglobin: 13 g/dL (ref 12.0–15.0)
Lymphocytes Relative: 24.7 % (ref 12.0–46.0)
Lymphs Abs: 2.7 10*3/uL (ref 0.7–4.0)
MCHC: 32.8 g/dL (ref 30.0–36.0)
MCV: 89.3 fl (ref 78.0–100.0)
Monocytes Absolute: 0.8 10*3/uL (ref 0.1–1.0)
Monocytes Relative: 7.7 % (ref 3.0–12.0)
Neutro Abs: 6.8 10*3/uL (ref 1.4–7.7)
Neutrophils Relative %: 62.6 % (ref 43.0–77.0)
Platelets: 215 10*3/uL (ref 150.0–400.0)
RBC: 4.43 Mil/uL (ref 3.87–5.11)
RDW: 14.3 % (ref 11.5–15.5)
WBC: 10.8 10*3/uL — ABNORMAL HIGH (ref 4.0–10.5)

## 2023-08-22 LAB — COMPREHENSIVE METABOLIC PANEL
ALT: 15 U/L (ref 0–35)
AST: 14 U/L (ref 0–37)
Albumin: 4 g/dL (ref 3.5–5.2)
Alkaline Phosphatase: 64 U/L (ref 39–117)
BUN: 23 mg/dL (ref 6–23)
CO2: 25 mEq/L (ref 19–32)
Calcium: 9.2 mg/dL (ref 8.4–10.5)
Chloride: 106 mEq/L (ref 96–112)
Creatinine, Ser: 0.87 mg/dL (ref 0.40–1.20)
GFR: 68.55 mL/min (ref >60.00)
Glucose, Bld: 152 mg/dL — ABNORMAL HIGH (ref 70–99)
Potassium: 4.6 mEq/L (ref 3.5–5.1)
Sodium: 140 mEq/L (ref 135–145)
Total Bilirubin: 0.5 mg/dL (ref 0.2–1.2)
Total Protein: 7 g/dL (ref 6.0–8.3)

## 2023-08-22 NOTE — Progress Notes (Signed)
Chief Complaint:   Referring Provider:  Ailene Ravel, MD      ASSESSMENT AND PLAN;   #1. RLQ pain   #2. UGI bleed d/t DU (resolved)  #3. H/O polyps Nov 2018  Plan: -CBC, CMP -CT AP with contrast -Continue protonix 40mg  po QD -FU colon Nov 2025   HPI:    Mercedes Adams is a 67 y.o. female  With multiple medical problems as below including DM 2, HLD, HTN, OA, anxiety, hypothyroidism, CAD s/p recent CABG, CHF (40-45% Nov 2023 Dr Nadara Eaton)  C/O RLQ pain  -Intermittent -At times sharp -Gets worse after eating -denies having any diarrhea or constipation -No melena or hematochezia -For about a month, getting worse -No fever chills or night sweats. -Has history of previous appendicectomy.   No nausea, vomiting, heartburn, regurgitation, odynophagia or dysphagia.  No significant diarrhea or constipation.  No melena or hematochezia. No unintentional weight loss.  S/P hospitalization Nov 2023 with STEMI, UGI bleed d/t DU s/p endoscopic therapy.  Subsequently underwent CABG 11/19/2022.  Currently maintained on aspirin.  Has been gaining weight  No further GI bleeding.  She is maintained on Protonix 40 mg p.o. daily  Doing very well from GI standpoint.  Anemia has almost resolved.  Most recent hemoglobin was 11.5 on 12/25/2022 (prev 8.05 November 2022).   No abdominal pain.  Previous GI procedures: EGD 11/02/2022: DU with VV s/p epi/clips/purestat. Neg HP Colonoscopy 10/2017: Colon polyp s/p polypectomy. Rpt 7 yrs.   Past Medical History:  Diagnosis Date   Anxiety    Arthritis    Diabetes mellitus without complication (HCC)    GERD (gastroesophageal reflux disease)    Heart murmur    Hyperlipidemia    Hypertension    Thyroid disease     Past Surgical History:  Procedure Laterality Date   APPENDECTOMY     BIOPSY  11/02/2022   Procedure: BIOPSY;  Surgeon: Lynann Bologna, MD;  Location: [facility] ENDOSCOPY;  Service: Gastroenterology;;   CESAREAN SECTION     x 2    COLONOSCOPY  01/22/2007   Colonic polyps status post polypectomy. Minimal sigmoid diverticulosis   CORONARY ARTERY BYPASS GRAFT N/A 11/21/2022   Procedure: CORONARY ARTERY BYPASS GRAFTING (CABG) X 3, USING LEFT INTERNAL MAMMARY ARTERY AND ENDOSCOPICALLY HARVESTED RIGHT GREATER SAPHENOUS VEIN.;  Surgeon: Lovett Sox, MD;  Location: [facility] OR;  Service: Open Heart Surgery;  Laterality: N/A;   ESOPHAGOGASTRODUODENOSCOPY (EGD) WITH PROPOFOL N/A 11/02/2022   Procedure: ESOPHAGOGASTRODUODENOSCOPY (EGD) WITH PROPOFOL;  Surgeon: Lynann Bologna, MD;  Location: [facility] ENDOSCOPY;  Service: Gastroenterology;  Laterality: N/A;   HEMOSTASIS CLIP PLACEMENT  11/02/2022   Procedure: HEMOSTASIS CLIP PLACEMENT;  Surgeon: Lynann Bologna, MD;  Location: [facility] ENDOSCOPY;  Service: Gastroenterology;;   HEMOSTASIS CONTROL  11/02/2022   Procedure: HEMOSTASIS CONTROL;  Surgeon: Lynann Bologna, MD;  Location: [facility] ENDOSCOPY;  Service: Gastroenterology;;   LEFT HEART CATH AND CORONARY ANGIOGRAPHY N/A 11/01/2022   Procedure: LEFT HEART CATH AND CORONARY ANGIOGRAPHY;  Surgeon: Yates Decamp, MD;  Location: [facility] INVASIVE CV LAB;  Service: Cardiovascular;  Laterality: N/A;   REPLACEMENT TOTAL KNEE     left knee   SUBMUCOSAL INJECTION  11/02/2022   Procedure: SUBMUCOSAL INJECTION;  Surgeon: Lynann Bologna, MD;  Location: [facility] ENDOSCOPY;  Service: Gastroenterology;;   TEE WITHOUT CARDIOVERSION N/A 11/21/2022   Procedure: TRANSESOPHAGEAL ECHOCARDIOGRAM (TEE);  Surgeon: Lovett Sox, MD;  Location: [facility] OR;  Service: Open Heart Surgery;  Laterality: N/A;    Family History  Problem Relation  Age of Onset   Hypertension Mother    High Cholesterol Mother    Hypertension Father    High Cholesterol Father    Heart failure Father     Social History   Tobacco Use   Smoking status: Never   Smokeless tobacco: Never  Vaping Use   Vaping status: Never Used  Substance Use Topics   Alcohol use: No   Drug use: No    Current Outpatient  Medications  Medication Sig Dispense Refill   alendronate (FOSAMAX) 70 MG tablet Take 70 mg by mouth once a week.     aspirin EC 325 MG tablet Take 1 tablet (325 mg total) by mouth daily.     Calcium Carbonate-Vitamin D (CALTRATE 600+D PO) Take 1 capsule by mouth daily.     FARXIGA 10 MG TABS tablet Take 10 mg by mouth daily.     Fe Fum-Vit C-Vit B12-FA (TRIGELS-F FORTE) CAPS capsule Take 1 capsule by mouth daily after breakfast. May substitute near equivalent  0   fenofibrate 160 MG tablet Take 1 tablet (160 mg total) by mouth daily. 90 tablet 0   folic acid (FOLVITE) 1 MG tablet Take 1 mg by mouth daily.     furosemide (LASIX) 20 MG tablet Take 1 tablet (20 mg total) by mouth daily as needed for fluid.     levothyroxine (SYNTHROID, LEVOTHROID) 100 MCG tablet Take 100 mcg by mouth daily before breakfast.     losartan-hydrochlorothiazide (HYZAAR) 50-12.5 MG tablet Take 1 tablet by mouth every morning. 30 tablet 2   metFORMIN (GLUCOPHAGE) 1000 MG tablet Take 2,000 mg by mouth in the morning and at bedtime.     metoprolol tartrate (LOPRESSOR) 100 MG tablet Take 1 tablet (100 mg total) by mouth 2 (two) times daily. 180 tablet 3   Multiple Vitamin (MULTIVITAMIN) capsule Take 1 capsule by mouth daily.     ondansetron (ZOFRAN) 4 MG tablet Take 1 tablet (4 mg total) by mouth 3 (three) times daily as needed for nausea or vomiting. 30 tablet 1   oxyCODONE (OXY IR/ROXICODONE) 5 MG immediate release tablet Take 5 mg by mouth every 4 (four) hours as needed for severe pain.     pantoprazole (PROTONIX) 40 MG tablet TAKE 1 TABLET TWICE A DAY FOR 1 MONTH THEN DECREASE TO ONCE DAILY BEFORE BREAKFAST. (Patient taking differently: Take 40 mg by mouth daily. TAKE 1 TABLET TWICE A DAY FOR 1 MONTH THEN DECREASE TO ONCE DAILY BEFORE BREAKFAST.) 180 tablet 0   rosuvastatin (CRESTOR) 40 MG tablet Take 1 tablet (40 mg total) by mouth at bedtime. 90 tablet 3   No current facility-administered medications for this visit.     No Known Allergies  Review of Systems:  neg     Physical Exam:    BP 110/60 (BP Location: Left Arm, Patient Position: Sitting, Cuff Size: Normal)   Pulse 60   Ht 5' (1.524 m)   Wt 186 lb 6 oz (84.5 kg)   BMI 36.40 kg/m  Wt Readings from Last 3 Encounters:  08/22/23 186 lb 6 oz (84.5 kg)  08/20/23 186 lb (84.4 kg)  02/19/23 173 lb (78.5 kg)   Constitutional:  Well-developed, in no acute distress. Psychiatric: Normal mood and affect. Behavior is normal. HEENT: Pupils normal.  Conjunctivae are normal. No scleral icterus.  Cardiovascular: Normal rate, regular rhythm. No edema Pulmonary/chest: Effort normal and breath sounds normal. No wheezing, rales or rhonchi. Abdominal: Soft, nondistended.  Minimal right lower quadrant tenderness.  Bowel  sounds active throughout. There are no masses palpable. No hepatomegaly. Rectal: Deferred Neurological: Alert and oriented to person place and time. Skin: Skin is warm and dry. No rashes noted.  Data Reviewed: I have personally reviewed following labs and imaging studies  CBC:    Latest Ref Rng & Units 01/22/2023   10:03 AM 12/25/2022    2:09 PM 11/25/2022   12:25 AM  CBC  WBC 4.0 - 10.5 K/uL 9.3  9.8  10.4   Hemoglobin 12.0 - 15.0 g/dL 91.4  78.2  8.6   Hematocrit 36.0 - 46.0 % 36.4  35.8  27.1   Platelets 150.0 - 400.0 K/uL 267.0  340.0  148     CMP:    Latest Ref Rng & Units 01/22/2023   10:03 AM 11/26/2022   12:13 AM 11/25/2022    1:39 AM  CMP  Glucose 70 - 99 mg/dL 956  213  086   BUN 6 - 23 mg/dL 18  11  11    Creatinine 0.40 - 1.20 mg/dL 5.78  4.69  6.29   Sodium 135 - 145 mEq/L 139  140  140   Potassium 3.5 - 5.1 mEq/L 5.3  4.2  4.0   Chloride 96 - 112 mEq/L 106  106  108   CO2 19 - 32 mEq/L 25  26  26    Calcium 8.4 - 10.5 mg/dL 9.2  9.2  8.2   Total Protein 6.0 - 8.3 g/dL 7.2     Total Bilirubin 0.2 - 1.2 mg/dL 0.3     Alkaline Phos 39 - 117 U/L 79     AST 0 - 37 U/L 14     ALT 0 - 35 U/L 24         Radiology Studies: No results found.    Edman Circle, MD 08/22/2023, 11:35 AM  Cc: Ailene Ravel, MD

## 2023-08-22 NOTE — Patient Instructions (Signed)
_______________________________________________________  If your blood pressure at your visit was 140/90 or greater, please contact your primary care physician to follow up on this.  _______________________________________________________  If you are age 68 or older, your body mass index should be between 23-30. Your Body mass index is 36.4 kg/m. If this is out of the aforementioned range listed, please consider follow up with your Primary Care Provider.  If you are age 43 or younger, your body mass index should be between 19-25. Your Body mass index is 36.4 kg/m. If this is out of the aformentioned range listed, please consider follow up with your Primary Care Provider.   ________________________________________________________  The [facility] providers would like to encourage you to use [facility] to communicate with providers for non-urgent requests or questions.  Due to long hold times on the telephone, sending your provider a message by [facility] may be a faster and more efficient way to get a response.  Please allow 48 business hours for a response.  Please remember that this is for non-urgent requests.  _______________________________________________________  Your provider has requested that you go to the basement level for lab work before leaving today. Press "B" on the elevator. The lab is located at the first door on the left as you exit the elevator.  Repeat colonoscopy for  10-2024. Please call 2 months prior to schedule this. A letter will be sent as it gets closer.  Continue protonix   You have been scheduled for a CT scan of the abdomen and pelvis at [facility] [place], [place], [place] [address]).   You are scheduled on 08-28-2023 at 4pm. You should by 1:45pm for your appointment time for registration. Please follow the written instructions below on the day of your exam:  WARNING: IF YOU ARE ALLERGIC TO IODINE/X-RAY DYE, PLEASE NOTIFY RADIOLOGY IMMEDIATELY AT  [phone]! YOU WILL BE GIVEN A 13 HOUR PREMEDICATION PREP.  1) Do not eat or drink anything after 12pm (4 hours prior to your test) 2) You will be given 2 bottles of oral contrast to drink on site. The solution may taste better if refrigerated, but do NOT add ice or any other liquid to this solution. Shake well before drinking.    Drink 1 bottle of contrast @ 2pm (2 hours prior to your exam)  Drink 1 bottle of contrast @ 3pm (1 hour prior to your exam)  You may take any medications as prescribed with a small amount of water, if necessary. If you take any of the following medications: METFORMIN, GLUCOPHAGE, GLUCOVANCE, AVANDAMET, RIOMET, FORTAMET, ACTOPLUS MET, JANUMET, GLUMETZA or METAGLIP, you MAY be asked to HOLD this medication 48 hours AFTER the exam.  The purpose of you drinking the oral contrast is to aid in the visualization of your intestinal tract. The contrast solution may cause some diarrhea. Depending on your individual set of symptoms, you may also receive an intravenous injection of x-ray contrast/dye. Plan on being at [facility] for 30 minutes or longer, depending on the type of exam you are having performed.  This test typically takes 30-45 minutes to complete.  If you have any questions regarding your exam or if you need to reschedule, you may call the CT department at [phone] between the hours of 8:00 am and 5:00 pm, Monday-Friday.  ________________________________________________________________________   Thank you,  Dr. Lynann Bologna

## 2023-08-28 ENCOUNTER — Other Ambulatory Visit (HOSPITAL_COMMUNITY): Payer: Medicare Other

## 2023-09-05 ENCOUNTER — Ambulatory Visit (HOSPITAL_COMMUNITY)
Admission: RE | Admit: 2023-09-05 | Discharge: 2023-09-05 | Disposition: A | Discharge status: Home / Self Care | Payer: Medicare Other | Source: Ambulatory Visit | Attending: Gastroenterology | Admitting: Gastroenterology

## 2023-09-05 DIAGNOSIS — R1031 Right lower quadrant pain: Secondary | ICD-10-CM | POA: Insufficient documentation

## 2023-09-05 MED ORDER — IOHEXOL 9 MG/ML PO SOLN
1000.0000 mL | Freq: Once | ORAL | Status: AC
  Administered 2023-09-05: 1000 mL via ORAL

## 2023-09-05 MED ORDER — SODIUM CHLORIDE (PF) 0.9 % IJ SOLN
INTRAMUSCULAR | Status: AC
  Filled 2023-09-05: qty 50

## 2023-09-05 MED ORDER — IOHEXOL 300 MG/ML  SOLN
100.0000 mL | Freq: Once | INTRAMUSCULAR | Status: AC | PRN
  Administered 2023-09-05: 100 mL via INTRAVENOUS

## 2023-10-01 ENCOUNTER — Ambulatory Visit: Payer: Medicare Other | Admitting: Cardiology

## 2023-10-17 ENCOUNTER — Ambulatory Visit: Payer: Self-pay | Admitting: Cardiology

## 2023-11-16 ENCOUNTER — Other Ambulatory Visit: Payer: Self-pay | Admitting: Cardiology

## 2023-11-16 DIAGNOSIS — I10 Essential (primary) hypertension: Secondary | ICD-10-CM

## 2023-11-16 DIAGNOSIS — I25118 Atherosclerotic heart disease of native coronary artery with other forms of angina pectoris: Secondary | ICD-10-CM

## 2024-02-21 ENCOUNTER — Telehealth: Payer: Self-pay | Admitting: Cardiology

## 2024-02-21 DIAGNOSIS — I25118 Atherosclerotic heart disease of native coronary artery with other forms of angina pectoris: Secondary | ICD-10-CM

## 2024-02-21 DIAGNOSIS — E782 Mixed hyperlipidemia: Secondary | ICD-10-CM

## 2024-02-21 NOTE — Telephone Encounter (Signed)
 Spoke to patients husband, they would like to get lab work done at the [facility] but they do not have any orders. Please fax orders to [phone] Patient is not at [facility] right now, please call when orders are faxed.

## 2024-02-21 NOTE — Telephone Encounter (Signed)
Lipids only?

## 2024-02-24 NOTE — Telephone Encounter (Signed)
 Spoke with the patient's husband and advised that labs have been ordered and sent to [facility].

## 2024-02-27 LAB — LIPID PANEL
Chol/HDL Ratio: 3.1 ratio (ref 0.0–4.4)
Cholesterol, Total: 109 mg/dL (ref 100–199)
HDL: 35 mg/dL — ABNORMAL LOW (ref >39)
LDL Chol Calc (NIH): 53 mg/dL (ref 0–99)
Triglycerides: 117 mg/dL (ref 0–149)
VLDL Cholesterol Cal: 21 mg/dL (ref 5–40)

## 2024-03-02 ENCOUNTER — Ambulatory Visit: Payer: Medicare Other | Attending: Cardiology | Admitting: Cardiology

## 2024-03-02 ENCOUNTER — Encounter: Payer: Self-pay | Admitting: Cardiology

## 2024-03-02 VITALS — BP 124/62 | HR 65 | Resp 16 | Ht 60.0 in | Wt 181.2 lb

## 2024-03-02 DIAGNOSIS — I10 Essential (primary) hypertension: Secondary | ICD-10-CM | POA: Diagnosis present

## 2024-03-02 DIAGNOSIS — R0609 Other forms of dyspnea: Secondary | ICD-10-CM | POA: Diagnosis present

## 2024-03-02 DIAGNOSIS — I25118 Atherosclerotic heart disease of native coronary artery with other forms of angina pectoris: Secondary | ICD-10-CM | POA: Diagnosis present

## 2024-03-02 DIAGNOSIS — E782 Mixed hyperlipidemia: Secondary | ICD-10-CM

## 2024-03-02 NOTE — Progress Notes (Signed)
 Cardiology Office Note:  .   Date:  03/02/2024  ID:  Mercedes Adams, DOB 1956/03/12, MRN [id number] PCP: Ailene Ravel, MD   [facility]Care Providers Cardiologist:  Yates Decamp, MD   History of Present Illness: .   Mercedes Adams is a 68 y.o. 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  She is presently asymptomatic except for mild chronic dyspnea, no further leg edema, no chest pain, no dizziness, has returned back to full activity without significant limitations.  She is accompanied by her husband.  Discussed the use of AI scribe software for clinical note transcription with the patient, who gave verbal consent to proceed.  History of Present Illness   The patient, with a history of stroke, hip and knee replacements, and diabetes, presents for routine follow-up. She reports her blood glucose levels fluctuate between 120 and 150. She has been keeping a record of her blood glucose levels. She reports some shortness of breath, but it has not worsened. She has been trying to exercise more, but admits she is not doing as much as she should. She also reports some swelling in her ankle. She has not gained or lost significant weight, but she has been trying to lose weight. She reports she has been trying to eat healthier, but admits she tends to eat more than she should. She has been trying to stay away from fast food and eat at restaurants that offer vegetables. She also reports drinking iced tea.      Labs   Lab Results  Component Value Date   CHOL 109 02/26/2024   HDL 35 (L) 02/26/2024   LDLCALC 53 02/26/2024   LDLDIRECT 79 11/02/2022   TRIG 117 02/26/2024   CHOLHDL 3.1 02/26/2024   Lab Results  Component Value Date   NA 140 08/22/2023   K 4.6 08/22/2023   CO2 25 08/22/2023   GLUCOSE 152 (H) 08/22/2023   BUN 23 08/22/2023   CREATININE 0.87 08/22/2023   CALCIUM 9.2 08/22/2023   GFR 68.55  08/22/2023   GFRNONAA >60 11/26/2022      Latest Ref Rng & Units 08/22/2023   11:59 AM 01/22/2023   10:03 AM 11/26/2022   12:13 AM  BMP  Glucose 70 - 99 mg/dL 409  811  914   BUN 6 - 23 mg/dL 23  18  11    Creatinine 0.40 - 1.20 mg/dL 7.82  9.56  2.13   Sodium 135 - 145 mEq/L 140  139  140   Potassium 3.5 - 5.1 mEq/L 4.6  5.3  4.2   Chloride 96 - 112 mEq/L 106  106  106   CO2 19 - 32 mEq/L 25  25  26    Calcium 8.4 - 10.5 mg/dL 9.2  9.2  9.2       Latest Ref Rng & Units 08/22/2023   11:59 AM 01/22/2023   10:03 AM 12/25/2022    2:09 PM  CBC  WBC 4.0 - 10.5 K/uL 10.8  9.3  9.8   Hemoglobin 12.0 - 15.0 g/dL 08.6  57.8  46.9   Hematocrit 36.0 - 46.0 % 39.6  36.4  35.8   Platelets 150.0 - 400.0 K/uL 215.0  267.0  340.0      Lab Results  Component Value Date   TSH 1.298 11/25/2022    PCP Labs 11/26/2023:  A1c 6.9%.  TSH normal at 0.948.   Review of  Systems  Cardiovascular:  Positive for dyspnea on exertion (chronic) and leg swelling (occasional). Negative for chest pain.   Physical Exam:   VS:  BP 124/62 (BP Location: Right Arm, Patient Position: Sitting, Cuff Size: Large)   Pulse 65   Resp 16   Ht 5' (1.524 m)   Wt 181 lb 3.2 oz (82.2 kg)   SpO2 96%   BMI 35.39 kg/m    Wt Readings from Last 3 Encounters:  03/02/24 181 lb 3.2 oz (82.2 kg)  08/22/23 186 lb 6 oz (84.5 kg)  08/20/23 186 lb (84.4 kg)    Physical Exam Constitutional:      Appearance: She is obese.  Neck:     Vascular: No carotid bruit or JVD.  Cardiovascular:     Rate and Rhythm: Normal rate and regular rhythm.     Pulses: Intact distal pulses.     Heart sounds: Normal heart sounds. No murmur heard.    No gallop.  Pulmonary:     Effort: Pulmonary effort is normal.     Breath sounds: Normal breath sounds.  Abdominal:     General: Bowel sounds are normal.     Palpations: Abdomen is soft.  Musculoskeletal:     Right lower leg: No edema.     Left lower leg: No edema.    Studies Reviewed: Marland Kitchen     Left Heart Catheterization 11/01/22:      CABG times 03/19/2022 with LIMA to LAD, SVG to a complex PL branch , SVG to RCA   EKG:         EKG 08/20/2023: Normal sinus rhythm with rate of 65 bpm.   Medications and allergies    No Known Allergies   Current Outpatient Medications:    alendronate (FOSAMAX) 70 MG tablet, Take 70 mg by mouth once a week., Disp: , Rfl:    aspirin EC 325 MG tablet, Take 1 tablet (325 mg total) by mouth daily., Disp: , Rfl:    atorvastatin (LIPITOR) 80 MG tablet, Take 80 mg by mouth at bedtime., Disp: , Rfl:    Calcium Carbonate-Vitamin D (CALTRATE 600+D PO), Take 1 capsule by mouth daily., Disp: , Rfl:    FARXIGA 10 MG TABS tablet, Take 10 mg by mouth daily., Disp: , Rfl:    Fe Fum-Vit C-Vit B12-FA (TRIGELS-F FORTE) CAPS capsule, Take 1 capsule by mouth daily after breakfast. May substitute near equivalent, Disp: , Rfl: 0   fenofibrate 160 MG tablet, Take 1 tablet (160 mg total) by mouth daily., Disp: 90 tablet, Rfl: 0   furosemide (LASIX) 20 MG tablet, Take 1 tablet (20 mg total) by mouth daily as needed for fluid., Disp: , Rfl:    levothyroxine (SYNTHROID, LEVOTHROID) 100 MCG tablet, Take 100 mcg by mouth daily before breakfast., Disp: , Rfl:    losartan-hydrochlorothiazide (HYZAAR) 50-12.5 MG tablet, TAKE 1 TABLET BY MOUTH EVERY MORNING, Disp: 90 tablet, Rfl: 2   metFORMIN (GLUCOPHAGE) 1000 MG tablet, Take 2,000 mg by mouth in the morning and at bedtime., Disp: , Rfl:    metoprolol tartrate (LOPRESSOR) 100 MG tablet, Take 1 tablet (100 mg total) by mouth 2 (two) times daily., Disp: 180 tablet, Rfl: 3   Multiple Vitamin (MULTIVITAMIN) capsule, Take 1 capsule by mouth daily., Disp: , Rfl:    ondansetron (ZOFRAN) 4 MG tablet, Take 1 tablet (4 mg total) by mouth 3 (three) times daily as needed for nausea or vomiting., Disp: 30 tablet, Rfl: 1   pantoprazole (PROTONIX) 40 MG  tablet, TAKE 1 TABLET TWICE A DAY FOR 1 MONTH THEN DECREASE TO ONCE DAILY BEFORE  BREAKFAST. (Patient taking differently: Take 40 mg by mouth daily. TAKE 1 TABLET TWICE A DAY FOR 1 MONTH THEN DECREASE TO ONCE DAILY BEFORE BREAKFAST.), Disp: 180 tablet, Rfl: 0   ASSESSMENT AND PLAN: .      ICD-10-CM   1. Coronary artery disease of native artery of native heart with stable angina pectoris (HCC)  I25.118     2. Dyspnea on exertion  R06.09     3. Essential hypertension  I10     4. Mixed hyperlipidemia  E78.2       1. Coronary artery disease of native artery of native heart with stable angina pectoris (HCC) She remains stable from cardiac standpoint with stable angina pectoris, no recent sublingual nitroglycerin use.  2. Dyspnea on exertion No clinical evidence of heart failure.  She is presently on appropriate medical therapy for chronic diastolic heart failure including Farxiga, losartan HCT and metoprolol.  Same medications are also controlling hypertension.  3. Essential hypertension Normal renal function.  Blood pressure is at goal today.  No changes were done today.  4. Mixed hyperlipidemia On her last office visit and increase the dose of atorvastatin, repeat lipid profile testing reveals excellent control of LDL to <70.      Type 2 Diabetes Mellitus   Her Type 2 Diabetes Mellitus shows fluctuating blood glucose levels between 120 and 150 mg/dL, with an improved Hemoglobin A1c of 6.9% from previous levels of 8-9%. Emphasis is placed on dietary modifications and weight management to enhance diabetes control. The negative impact of diet drinks on weight and potential cognitive effects was discussed. She should continue current diabetes medications, reduce sugar intake, practice portion control even with healthy foods, engage in regular exercise for weight management, and avoid diet drinks.  Obesity   Obesity is contributing to overall health risks, including diabetes management. Weight loss through dietary changes and exercise is crucial. Challenges such as  arthritis and cold weather make exercise difficult, so reducing food intake is essential. She should reduce portion sizes, avoid high-calorie foods, and engage in moderate exercise as tolerated. The risks of diet drinks were discussed, and she is advised to avoid them.  Follow-up   She will be transferred to [facility] for continued care, with a follow-up appointment scheduled in six months to monitor her conditions and adjust treatment as necessary.   Signed,  Yates Decamp, MD, [facility] 03/02/2024, 3:23 PM [facility] [address] [place], [place] [address] Phone: [phone]. Fax:  [phone]

## 2024-03-02 NOTE — Patient Instructions (Signed)
 Medication Instructions:  Your physician recommends that you continue on your current medications as directed. Please refer to the Current Medication list given to you today.  *If you need a refill on your cardiac medications before your next appointment, please call your pharmacy*   Lab Work: none If you have labs (blood work) drawn today and your tests are completely normal, you will receive your results only by: MyChart Message (if you have MyChart) OR A paper copy in the mail If you have any lab test that is abnormal or we need to change your treatment, we will call you to review the results.   Testing/Procedures: none   Follow-Up: At [facility], you and your health needs are our priority.  As part of our continuing mission to provide you with exceptional heart care, we have created designated Provider Care Teams.  These Care Teams include your primary Cardiologist (physician) and Advanced Practice Providers (APPs -  Physician Assistants and Nurse Practitioners) who all work together to provide you with the care you need, when you need it.  We recommend signing up for the patient portal called "MyChart".  Sign up information is provided on this After Visit Summary.  MyChart is used to connect with patients for Virtual Visits (Telemedicine).  Patients are able to view lab/test results, encounter notes, upcoming appointments, etc.  Non-urgent messages can be sent to your provider as well.   To learn more about what you can do with MyChart, go to [email or web address].    Your next appointment:   6 month(s)  Provider:   In [place]  Other Instructions

## 2024-06-19 DIAGNOSIS — I10 Essential (primary) hypertension: Secondary | ICD-10-CM | POA: Insufficient documentation

## 2024-06-19 DIAGNOSIS — R011 Cardiac murmur, unspecified: Secondary | ICD-10-CM | POA: Insufficient documentation

## 2024-06-19 DIAGNOSIS — M1990 Unspecified osteoarthritis, unspecified site: Secondary | ICD-10-CM | POA: Insufficient documentation

## 2024-06-19 DIAGNOSIS — E785 Hyperlipidemia, unspecified: Secondary | ICD-10-CM | POA: Insufficient documentation

## 2024-06-19 DIAGNOSIS — K219 Gastro-esophageal reflux disease without esophagitis: Secondary | ICD-10-CM | POA: Insufficient documentation

## 2024-06-19 DIAGNOSIS — F419 Anxiety disorder, unspecified: Secondary | ICD-10-CM | POA: Insufficient documentation

## 2024-06-19 DIAGNOSIS — E079 Disorder of thyroid, unspecified: Secondary | ICD-10-CM | POA: Insufficient documentation

## 2024-06-19 DIAGNOSIS — E119 Type 2 diabetes mellitus without complications: Secondary | ICD-10-CM | POA: Insufficient documentation

## 2024-06-23 ENCOUNTER — Ambulatory Visit: Admitting: Cardiology

## 2024-09-02 ENCOUNTER — Ambulatory Visit

## 2024-09-02 VITALS — BP 106/68 | HR 69 | Ht 60.0 in | Wt 184.0 lb

## 2024-09-02 DIAGNOSIS — R748 Abnormal levels of other serum enzymes: Secondary | ICD-10-CM | POA: Diagnosis present

## 2024-09-02 DIAGNOSIS — Z951 Presence of aortocoronary bypass graft: Secondary | ICD-10-CM | POA: Insufficient documentation

## 2024-09-02 DIAGNOSIS — I10 Essential (primary) hypertension: Secondary | ICD-10-CM | POA: Insufficient documentation

## 2024-09-02 DIAGNOSIS — I5042 Chronic combined systolic (congestive) and diastolic (congestive) heart failure: Secondary | ICD-10-CM | POA: Diagnosis present

## 2024-09-02 DIAGNOSIS — I2510 Atherosclerotic heart disease of native coronary artery without angina pectoris: Secondary | ICD-10-CM | POA: Insufficient documentation

## 2024-09-02 DIAGNOSIS — E782 Mixed hyperlipidemia: Secondary | ICD-10-CM | POA: Insufficient documentation

## 2024-09-02 NOTE — Progress Notes (Signed)
 Cardiology Consultation:    Date:  09/02/2024   ID:  Mercedes Adams, DOB 1956-08-15, MRN [id number]  PCP:  Mercedes [name], MD  Cardiologist:  Alean JONELLE Kobus, MD   Referring MD: Mercedes [name], MD   No chief complaint on file.    ASSESSMENT AND PLAN:   [name] 68 year old woman history of CAD s/p NSTEMI November 2023 11/01/2022 s/p CABG three-vessel 11/21/2022 [LIMA-LAD, SVG-PLB, SVG-RCA], chronic diastolic CHF diabetes mellitus, hypertension, hyperlipidemia, history of GI bleed, CVA, obesity. Transthoracic echocardiogram 11/02/2022 noted low normal LVEF 50 to 55% with hypokinesis of inferior wall, mild to moderate MR IntraOp TEE 11/21/2022 noted LVEF 45 to 50%, mild concentric LVH, trace MR, trace TR. Here for follow-up and to establish care.  Problem List Items Addressed This Visit     CAD (coronary artery disease)   History of NSTEMI, s/p cardiac cath followed by CABG November 2023 [LIMA-LAD, SVG-PLB, SVG-RCA]. Good functional status.  CCS class I.  Continue with aspirin  81 mg once daily. Continue atorvastatin  80 mg once daily. On metoprolol  tartrate 100 mg twice daily. Has not required to use sublingual nitroglycerin .      Chronic CHF (congestive heart failure) (HCC)   Mild cardiomyopathy with inferior wall hypokinesis and preserved EF 50 to 55% mild to moderate MR on echocardiogram 11/02/2022 in setting of NSTEMI. IntraOp TEE during CABG noted LVEF 45 to 50% with trace MR and trace TR.  Appears compensated. NYHA class I. Mild ankle edema towards the end of the day, appears dependent type.  Advised salt restriction to below 2 g/day. Has not required any loop diuretic.  Guideline directed medical therapy: - Currently on Farxiga 10 mg once daily tolerating well no side effects. - On metoprolol  tartrate 100 mg twice daily, tolerating well. - Currently not on ACE inhibitor/ARB/Entresto or mineralocorticoid receptor antagonist.  Appears somewhat limited due to soft  blood pressures at baseline.  Will obtain transthoracic echocardiogram to assess interval change in cardiac structure and function. If still has reduced LVEF, will consider escalating therapy with addition of low-dose spironolactone and Entresto if blood pressure permits.      Relevant Orders   ECHOCARDIOGRAM COMPLETE   S/P CABG x 3   Hyperlipidemia   Lipid panel to review from 07/07/2024 shows LDL 71, HDL 38, total cholesterol 863 and triglycerides 153. CMP with labs from 07/07/2024 and PCPs office on [facility] reviewed notes mildly elevated AST and ALT with normal alkaline phosphatase.  Remains on atorvastatin  80 mg once daily at baseline. Will follow-up labs from PCPs office at next follow-up visit and if persistently elevated transaminases and results will need further evaluation.       Hypertension - Primary   Well-controlled, lower limit of normal. On metoprolol  tartrate 100 mg twice daily.       Relevant Orders   EKG 12-Lead (Completed)   ECHOCARDIOGRAM COMPLETE   Abnormal transaminases   Mildly abnormal transaminases on blood work from 07/07/2024 done through PCPs office at [facility]. Remains on high intensity statin therapy with atorvastatin  80 mg. Continue following up with PCP and okay to interrupt statin therapy if needed      Return to clinic tentatively in 6 months.  Earlier follow-up based on test results of echocardiogram if required for further optimization of cardiomyopathy therapy.   History of Present Illness:    Mercedes Adams is a 68 y.o. female who is being seen today for follow-up visit. PCP is [name], [name], MD. Last  visit with cardiology was 03/02/2024 with Dr [name] and is here for follow-up visit to establish care locally.  Pleasant woman here for the visit by herself.  Lives in [place].  Lives with her husband.  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  Also keeps herself busy with work at  home and [facility] [employer]  Has history of CAD s/p NSTEMI November 2023 11/01/2022 s/p CABG three-vessel 11/21/2022 [LIMA-LAD, SVG-PLB, SVG-RCA], chronic diastolic CHF diabetes mellitus, hypertension, hyperlipidemia, history of GI bleed, CVA, obesity. Transthoracic echocardiogram 11/02/2022 noted low normal LVEF 50 to 55% with hypokinesis of inferior wall, mild to moderate MR IntraOp TEE 11/21/2022 noted LVEF 45 to 50%, mild concentric LVH, trace MR, trace TR.  Mentions overall she has been doing well.  No significant changes in her baseline functional capacity.  Continues to exercise regularly. When she does moderate amount of activity she can get out of breath and has to settle down, however has not noticed any significant change in the past 1 year.  Not diligent about salt restriction. Towards the end of the day she can feel prominence of her bilateral lower extremity swelling, resolves keeping the feet elevated. Has not required to take Lasix  regularly.  Good adherence with her medications.  Lipid panel from 02/26/2024 total Vaslow 109, triglycerides 117, HDL 35 and LDL 53.  Optimal control. Lipid panel from 07/07/2024 total cholesterol 136, triglycerides 153, HDL 38 and LDL 71.  CMP 07/07/2024 with BUN 13, creatinine 0.78, EGFR 82 Sodium 145 and potassium 5.2. Mildly elevated transaminases AST 75 and ALT 61, normal alkaline phosphatase 110.  EKG in the clinic today shows sinus rhythm heart rate 69/min, PR interval 148 ms, QRS duration 86 ms and QTc 430 ms, no significant ischemic changes.  Past Medical History:  Diagnosis Date   Acute blood loss anemia 11/02/2022   Acute GI bleeding 11/02/2022   Acute heart failure with preserved ejection fraction (HFpEF) (HCC) 11/02/2022   Anxiety    Arthritis    Class 1 obesity due to excess calories with serious comorbidity and body mass index (BMI) of 32.0 to 32.9 in adult 11/02/2022   Coronary artery disease involving native coronary  artery of native heart without angina pectoris 11/01/2022   Diabetes mellitus without complication (HCC)    DM (diabetes mellitus) (HCC) 11/01/2022   Duodenal ulcer 11/02/2022   Essential hypertension 11/01/2022   GERD (gastroesophageal reflux disease)    Heart murmur    Hyperlipidemia    Hypertension    Hypertriglyceridemia 11/03/2022   Mixed hyperlipidemia 11/01/2022   NSTEMI (non-ST elevated myocardial infarction) (HCC) 11/01/2022   Return to work evaluation 02/05/2023   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   Thyroid disease    Unstable angina (HCC) 11/19/2022    Past Surgical History:  Procedure Laterality Date   APPENDECTOMY     BIOPSY  11/02/2022   Procedure: BIOPSY;  Surgeon: Charlanne Groom, MD;  Location: [facility] ENDOSCOPY;  Service: Gastroenterology;;   CESAREAN SECTION     x 2   COLONOSCOPY  01/22/2007   Colonic polyps status post polypectomy. Minimal sigmoid diverticulosis   CORONARY ARTERY BYPASS GRAFT N/A 11/21/2022   Procedure: CORONARY ARTERY BYPASS GRAFTING (CABG) X 3, USING LEFT INTERNAL MAMMARY ARTERY AND ENDOSCOPICALLY HARVESTED RIGHT GREATER SAPHENOUS VEIN.;  Surgeon: Obadiah Coy, MD;  Location: [facility] OR;  Service: Open Heart Surgery;  Laterality: N/A;   ESOPHAGOGASTRODUODENOSCOPY (EGD) WITH PROPOFOL  N/A 11/02/2022   Procedure: ESOPHAGOGASTRODUODENOSCOPY (EGD) WITH PROPOFOL ;  Surgeon: Charlanne Groom, MD;  Location: [facility] ENDOSCOPY;  Service: Gastroenterology;  Laterality: N/A;   HEMOSTASIS CLIP PLACEMENT  11/02/2022   Procedure: HEMOSTASIS CLIP PLACEMENT;  Surgeon: Charlanne Groom, MD;  Location: [facility] ENDOSCOPY;  Service: Gastroenterology;;   HEMOSTASIS CONTROL  11/02/2022   Procedure: HEMOSTASIS CONTROL;  Surgeon: Charlanne Groom, MD;  Location: [facility] ENDOSCOPY;  Service: Gastroenterology;;   LEFT HEART CATH AND CORONARY ANGIOGRAPHY N/A 11/01/2022   Procedure: LEFT HEART CATH AND CORONARY ANGIOGRAPHY;  Surgeon: [name] Heinz, MD;  Location: [facility] INVASIVE CV LAB;  Service: Cardiovascular;   Laterality: N/A;   REPLACEMENT TOTAL KNEE     left knee   SUBMUCOSAL INJECTION  11/02/2022   Procedure: SUBMUCOSAL INJECTION;  Surgeon: Charlanne Groom, MD;  Location: [facility] ENDOSCOPY;  Service: Gastroenterology;;   TEE WITHOUT CARDIOVERSION N/A 11/21/2022   Procedure: TRANSESOPHAGEAL ECHOCARDIOGRAM (TEE);  Surgeon: Obadiah Coy, MD;  Location: [facility] OR;  Service: Open Heart Surgery;  Laterality: N/A;    Current Medications: Current Meds  Medication Sig   alendronate (FOSAMAX) 70 MG tablet Take 70 mg by mouth once a week.   aspirin  EC 325 MG tablet Take 1 tablet (325 mg total) by mouth daily.   atorvastatin  (LIPITOR ) 80 MG tablet Take 80 mg by mouth at bedtime.   Calcium  Carbonate-Vitamin D (CALTRATE 600+D PO) Take 1 capsule by mouth daily.   FARXIGA 10 MG TABS tablet Take 10 mg by mouth daily.   Fe Fum-Vit C-Vit B12-FA (TRIGELS-F FORTE) CAPS capsule Take 1 capsule by mouth daily after breakfast. May substitute near equivalent   fenofibrate  160 MG tablet Take 1 tablet (160 mg total) by mouth daily.   furosemide  (LASIX ) 20 MG tablet Take 1 tablet (20 mg total) by mouth daily as needed for fluid.   levothyroxine  (SYNTHROID , LEVOTHROID) 100 MCG tablet Take 100 mcg by mouth daily before breakfast.   metFORMIN  (GLUCOPHAGE ) 1000 MG tablet Take 2,000 mg by mouth in the morning and at bedtime.   metoprolol  tartrate (LOPRESSOR ) 100 MG tablet Take 1 tablet (100 mg total) by mouth 2 (two) times daily.   Multiple Vitamin (MULTIVITAMIN) capsule Take 1 capsule by mouth daily.   ondansetron  (ZOFRAN ) 4 MG tablet Take 1 tablet (4 mg total) by mouth 3 (three) times daily as needed for nausea or vomiting.   pantoprazole  (PROTONIX ) 40 MG tablet TAKE 1 TABLET TWICE A DAY FOR 1 MONTH THEN DECREASE TO ONCE DAILY BEFORE BREAKFAST. (Patient taking differently: Take 40 mg by mouth daily. TAKE 1 TABLET TWICE A DAY FOR 1 MONTH THEN DECREASE TO ONCE DAILY BEFORE BREAKFAST.)     Allergies:   Patient has no known allergies.    Social History   Socioeconomic History   Marital status: Married    Spouse name: Not on file   Number of children: 2   Years of education: Not on file   Highest education level: Not on file  Occupational History   Not on file  Tobacco Use   Smoking status: Never   Smokeless tobacco: Never  Vaping Use   Vaping status: Never Used  Substance and Sexual Activity   Alcohol use: No   Drug use: No   Sexual activity: Not on file  Other Topics Concern   Not on file  Social History Narrative   Not on file   Social Drivers of Health   Financial Resource Strain: Not on file  Food Insecurity: Not on file  Transportation Needs: Not on file  Physical Activity: Not on file  Stress: Not on file  Social Connections: Not on file     Family History: The patient's family history includes Heart failure in her father; High Cholesterol in her father and mother; Hypertension in her father and mother. ROS:   Please see the history of present illness.    All 14 point review of systems negative except as described per history of present illness.  EKGs/Labs/Other Studies Reviewed:    The following studies were reviewed today:   EKG:  EKG Interpretation Date/Time:  Wednesday September 02 2024 16:09:07 EDT Ventricular Rate:  69 PR Interval:  148 QRS Duration:  86 QT Interval:  402 QTC Calculation: 430 R Axis:   25  Text Interpretation: Normal sinus rhythm Normal ECG When compared with ECG of 22-Nov-2022 07:03, Non-specific change in ST segment in Lateral leads T wave inversion no longer evident in Inferior leads Nonspecific T wave abnormality, improved in Anterolateral leads Confirmed by Liborio Hai reddy [phone]) on 09/02/2024 4:39:12 PM    Recent Labs: No results found for requested labs within last 365 days.  Recent Lipid Panel    Component Value Date/Time   CHOL 109 02/26/2024 0946   TRIG 117 02/26/2024 0946   HDL 35 (L) 02/26/2024 0946   CHOLHDL 3.1 02/26/2024 0946    CHOLHDL 4.4 11/02/2022 0758   VLDL 36 11/02/2022 0758   LDLCALC 53 02/26/2024 0946   LDLDIRECT 79 11/02/2022 0758    Physical Exam:    VS:  BP 106/68   Pulse 69   Ht 5' (1.524 m)   Wt 184 lb (83.5 kg)   SpO2 97%   BMI 35.94 kg/m     Wt Readings from Last 3 Encounters:  09/02/24 184 lb (83.5 kg)  03/02/24 181 lb 3.2 oz (82.2 kg)  08/22/23 186 lb 6 oz (84.5 kg)     GENERAL:  Well nourished, well developed in no acute distress NECK: No JVD; No carotid bruits CARDIAC: RRR, S1 and S2 present, no murmurs, no rubs, no gallops CHEST:  Clear to auscultation without rales, wheezing or rhonchi  Extremities: Trace bilateral ankle pitting pedal edema. Pulses bilaterally symmetric with radial 2+ and dorsalis pedis 2+ NEUROLOGIC:  Alert and oriented x 3  Medication Adjustments/Labs and Tests Ordered: Current medicines are reviewed at length with the patient today.  Concerns regarding medicines are outlined above.  Orders Placed This Encounter  Procedures   EKG 12-Lead   ECHOCARDIOGRAM COMPLETE   No orders of the defined types were placed in this encounter.   Signed, Hai jess Liborio, MD, MPH, [facility]. 09/02/2024 5:01 PM    [facility]

## 2024-09-02 NOTE — Assessment & Plan Note (Signed)
 Mildly abnormal transaminases on blood work from 07/07/2024 done through PCPs office at [facility]. Remains on high intensity statin therapy with atorvastatin  80 mg. Continue following up with PCP and okay to interrupt statin therapy if needed

## 2024-09-02 NOTE — Assessment & Plan Note (Signed)
 Lipid panel to review from 07/07/2024 shows LDL 71, HDL 38, total cholesterol 863 and triglycerides 153. CMP with labs from 07/07/2024 and PCPs office on [facility] reviewed notes mildly elevated AST and ALT with normal alkaline phosphatase.  Remains on atorvastatin  80 mg once daily at baseline. Will follow-up labs from PCPs office at next follow-up visit and if persistently elevated transaminases and results will need further evaluation.

## 2024-09-02 NOTE — Assessment & Plan Note (Signed)
 History of NSTEMI, s/p cardiac cath followed by CABG November 2023 [LIMA-LAD, SVG-PLB, SVG-RCA]. Good functional status.  CCS class I.  Continue with aspirin  81 mg once daily. Continue atorvastatin  80 mg once daily. On metoprolol  tartrate 100 mg twice daily. Has not required to use sublingual nitroglycerin .

## 2024-09-02 NOTE — Assessment & Plan Note (Signed)
 Mild cardiomyopathy with inferior wall hypokinesis and preserved EF 50 to 55% mild to moderate MR on echocardiogram 11/02/2022 in setting of NSTEMI. IntraOp TEE during CABG noted LVEF 45 to 50% with trace MR and trace TR.  Appears compensated. NYHA class I. Mild ankle edema towards the end of the day, appears dependent type.  Advised salt restriction to below 2 g/day. Has not required any loop diuretic.  Guideline directed medical therapy: - Currently on Farxiga 10 mg once daily tolerating well no side effects. - On metoprolol  tartrate 100 mg twice daily, tolerating well. - Currently not on ACE inhibitor/ARB/Entresto or mineralocorticoid receptor antagonist.  Appears somewhat limited due to soft blood pressures at baseline.  Will obtain transthoracic echocardiogram to assess interval change in cardiac structure and function. If still has reduced LVEF, will consider escalating therapy with addition of low-dose spironolactone and Entresto if blood pressure permits.

## 2024-09-02 NOTE — Assessment & Plan Note (Signed)
 Well-controlled, lower limit of normal. On metoprolol  tartrate 100 mg twice daily.

## 2024-09-02 NOTE — Patient Instructions (Signed)
  Testing/Procedures:  Your physician has requested that you have an echocardiogram. Echocardiography is a painless test that uses sound waves to create images of your heart. It provides your doctor with information about the size and shape of your heart and how well your heart's chambers and valves are working. This procedure takes approximately one hour. There are no restrictions for this procedure. Please do NOT wear cologne, perfume, aftershave, or lotions (deodorant is allowed). Please arrive 15 minutes prior to your appointment time.  Please note: We ask at that you not bring children with you during ultrasound (echo/ vascular) testing. Due to room size and safety concerns, children are not allowed in the ultrasound rooms during exams. Our front office staff cannot provide observation of children in our lobby area while testing is being conducted. An adult accompanying a patient to their appointment will only be allowed in the ultrasound room at the discretion of the ultrasound technician under special circumstances. We apologize for any inconvenience. [place] OFFICE  Follow-Up: At [facility], you and your health needs are our priority.  As part of our continuing mission to provide you with exceptional heart care, our providers are all part of one team.  This team includes your primary Cardiologist (physician) and Advanced Practice Providers or APPs (Physician Assistants and Nurse Practitioners) who all work together to provide you with the care you need, when you need it.  Your next appointment:   6 month(s)  Provider:   Alean Kobus, MD

## 2024-09-28 ENCOUNTER — Ambulatory Visit

## 2024-09-28 DIAGNOSIS — I10 Essential (primary) hypertension: Secondary | ICD-10-CM | POA: Insufficient documentation

## 2024-09-28 DIAGNOSIS — I5042 Chronic combined systolic (congestive) and diastolic (congestive) heart failure: Secondary | ICD-10-CM | POA: Insufficient documentation

## 2024-09-28 LAB — ECHOCARDIOGRAM COMPLETE
AR max vel: 2.77 cm2
AV Area VTI: 2.75 cm2
AV Area mean vel: 2.74 cm2
AV Mean grad: 5 mmHg
AV Peak grad: 8.9 mmHg
Ao pk vel: 1.49 m/s
Area-P 1/2: 2.74 cm2
MV M vel: 3.88 m/s
MV Peak grad: 60.2 mmHg
MV VTI: 1.77 cm2
S' Lateral: 3 cm

## 2024-10-02 ENCOUNTER — Telehealth: Payer: Self-pay

## 2024-10-02 NOTE — Telephone Encounter (Signed)
 Pt requesting a c/b about her Echo results.

## 2024-10-05 ENCOUNTER — Ambulatory Visit: Payer: Self-pay

## 2024-10-05 NOTE — Telephone Encounter (Signed)
 LVM per DPR- per Dr. Alverta Avers note regarding normal Echo results. Encouraged to call with any questions. Routed to PCP.

## 2024-12-08 ENCOUNTER — Encounter: Payer: Self-pay | Admitting: Gastroenterology

## 2025-02-01 ENCOUNTER — Encounter: Payer: Self-pay | Admitting: Gastroenterology
